# Patient Record
Sex: Female | Born: 1990 | Race: White | Hispanic: No | Marital: Married | Smoking: Never smoker
Health system: Southern US, Community
[De-identification: ages and names within clinical notes are randomized; demographics above are authoritative.]

## PROBLEM LIST (undated history)

## (undated) DIAGNOSIS — R011 Cardiac murmur, unspecified: Secondary | ICD-10-CM

## (undated) DIAGNOSIS — Q210 Ventricular septal defect: Secondary | ICD-10-CM

## (undated) DIAGNOSIS — R030 Elevated blood-pressure reading, without diagnosis of hypertension: Secondary | ICD-10-CM

## (undated) HISTORY — DX: Ventricular septal defect: Q21.0

## (undated) HISTORY — PX: MYRINGOTOMY: SUR874

## (undated) HISTORY — DX: Cardiac murmur, unspecified: R01.1

## (undated) HISTORY — PX: TIBIA FRACTURE SURGERY: SHX806

---
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
## 2011-05-22 ENCOUNTER — Ambulatory Visit (INDEPENDENT_AMBULATORY_CARE_PROVIDER_SITE_OTHER): Payer: BC Managed Care – PPO | Admitting: Family Medicine

## 2011-05-22 ENCOUNTER — Telehealth: Payer: Self-pay | Admitting: Internal Medicine

## 2011-05-22 ENCOUNTER — Encounter: Payer: Self-pay | Admitting: Family Medicine

## 2011-05-22 VITALS — BP 128/60 | HR 109 | Temp 98.5°F | Wt 162.0 lb

## 2011-05-22 DIAGNOSIS — R6889 Other general symptoms and signs: Secondary | ICD-10-CM | POA: Insufficient documentation

## 2011-05-22 MED ORDER — OSELTAMIVIR PHOSPHATE 75 MG PO CAPS: 75.0000 mg | ORAL_CAPSULE | Freq: Two times a day (BID) | ORAL | Status: AC

## 2011-05-22 NOTE — Progress Notes (Signed)
In school at [place] [employer].   Sick contacts with flu, similar sx.   She didn't get a flu shot.    "I feel awful." duration of symptoms: <48h of sx.   rhinorrhea:yes congestion:yes ear pain:yes sore throat:yes Cough:yes, dry myalgias:yes other concerns: feels hot and then has chills No vomiting, some nausea, no diarrhea.    ROS: See HPI.  Otherwise negative.    Meds, vitals, and allergies reviewed.   GEN: nad, alert and oriented HEENT: mucous membranes moist, TM w/o erythema, nasal epithelium injected, OP with cobblestoning NECK: supple w/o LA CV: rrr. Murmur noted PULM: ctab, no inc wob ABD: soft, +bs EXT: no edema Nontoxic.  Sinuses not ttp

## 2011-05-22 NOTE — Patient Instructions (Signed)
I would get a flu shot each fall (I would get it when you feel better). Drink plenty of fluids, take tylenol as needed, and consider taking the tamiflu- starting today.  I can't guarantee that you have the flu.  Even if you do, and you take the tamiflu, you should still have symptoms for at least 5-7 more days.   Take care.  Let us know if you have other concerns.

## 2011-05-24 ENCOUNTER — Encounter: Payer: Self-pay | Admitting: Family Medicine

## 2011-05-24 NOTE — Assessment & Plan Note (Signed)
I don't know if she really has flu.  We don't have the test.  She doesn't look toxic.  Okay for outpatient f/u.   It may be another virus.  I talked to her about options.  She needs to get flu shot when better and then yearly.  She'll consider the tamiflu, wanted to talk with mother.  D/w pt about supportive measures.

## 2011-09-16 ENCOUNTER — Emergency Department: Payer: Self-pay | Admitting: Internal Medicine

## 2011-09-16 LAB — CBC
HCT: 37.4 % (ref 35.0–47.0)
HGB: 12 g/dL (ref 12.0–16.0)
MCHC: 32.2 g/dL (ref 32.0–36.0)
Platelet: 272 10*3/uL (ref 150–440)
RBC: 4.34 10*6/uL (ref 3.80–5.20)
WBC: 13.3 10*3/uL — ABNORMAL HIGH (ref 3.6–11.0)

## 2011-09-16 LAB — AMYLASE: Amylase: 77 U/L (ref 25–115)

## 2012-07-13 ENCOUNTER — Ambulatory Visit: Payer: BC Managed Care – PPO

## 2012-07-22 ENCOUNTER — Ambulatory Visit: Payer: BC Managed Care – PPO

## 2013-10-16 IMAGING — CT CT CERVICAL SPINE WITHOUT CONTRAST
2 series · 10 of 14 positions shown, 12 images · non-contrast
Comparison: none

REASON FOR EXAM: COMMENTS:

PROCEDURE:     CT  - CT CERVICAL SPINE WO  - September 16, 2011  [DATE]
RESULT:      History: Neck pain.
Comparison Study: No recent.

[Series 6: axial · axial · 0.33mm/px · z∈[-310,-181]mm · 7 of 90 slices shown, 9 images]
[im 12/90  soft-tissue]
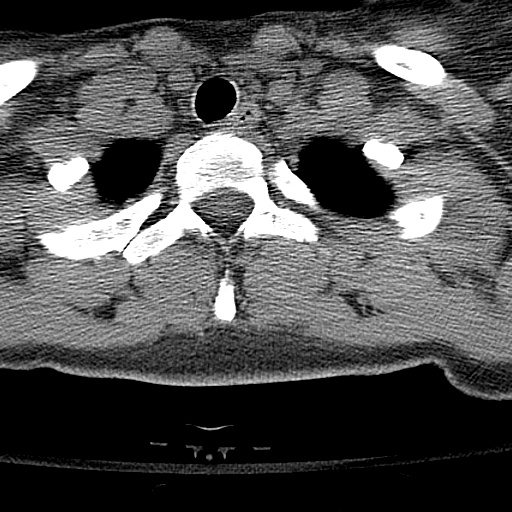
[im 12/90  bone]
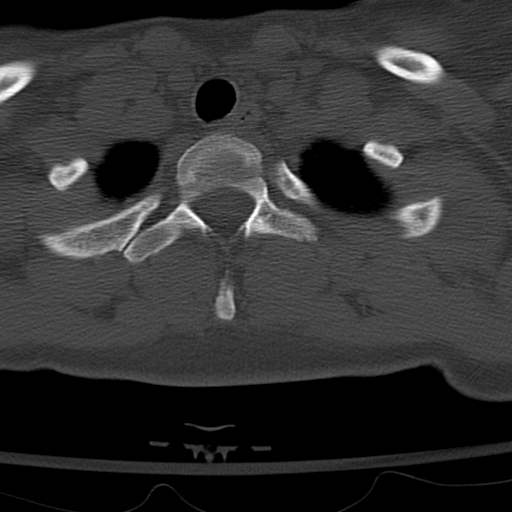
[im 23/90  bone]
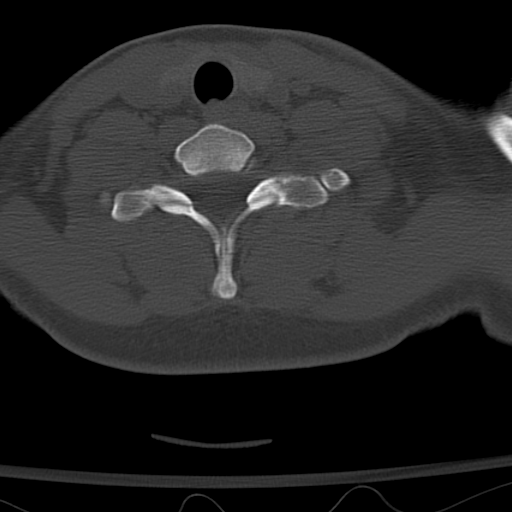
[im 34/90  bone]
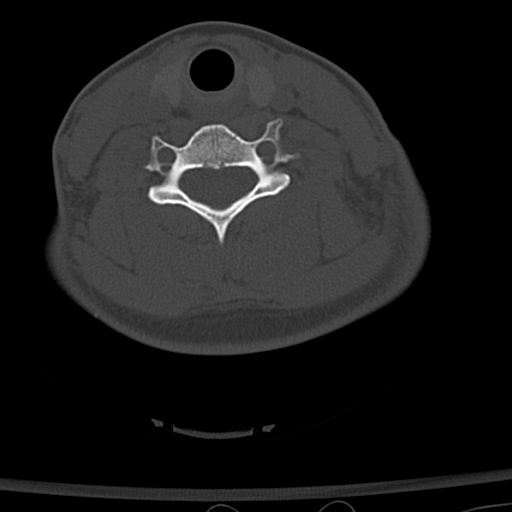
[im 45/90  bone]
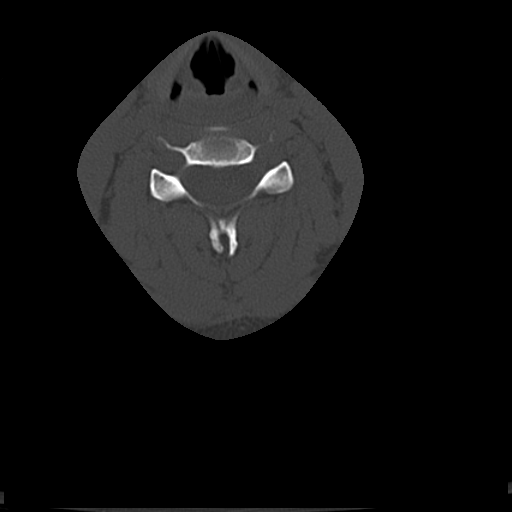
[im 56/90  soft-tissue]
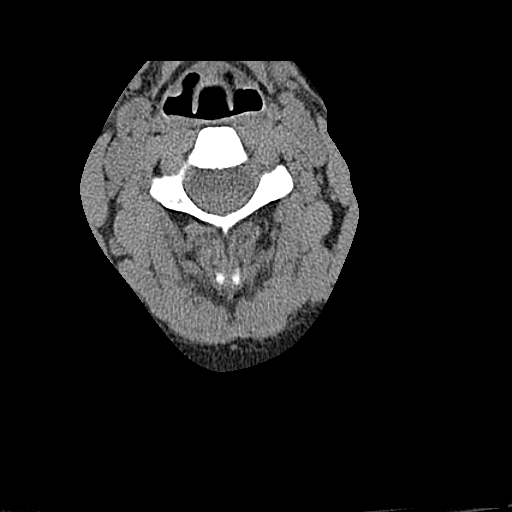
[im 56/90  bone]
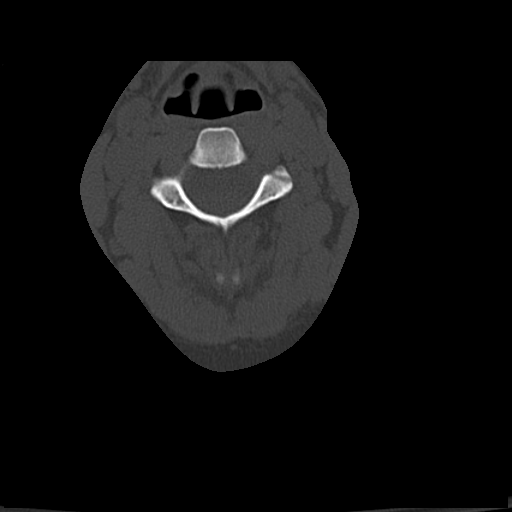
[im 67/90  bone]
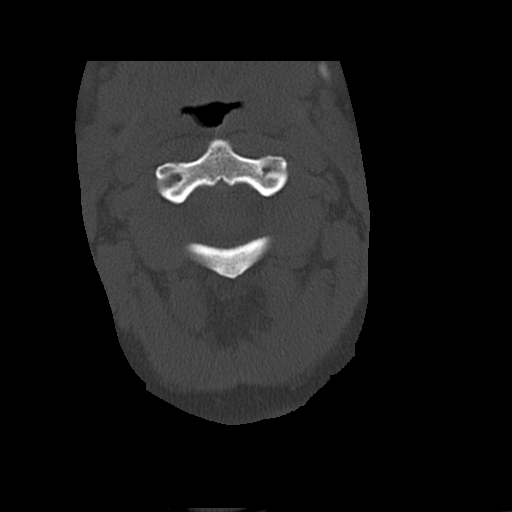
[im 78/90  bone]
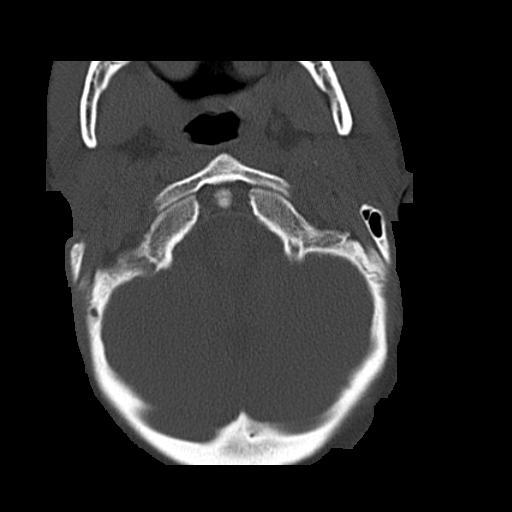

[Series 8: pac · axial · 0.26mm/px · z∈[-204,-187]mm · 3 of 46 slices shown]
[im 12/46  bone]
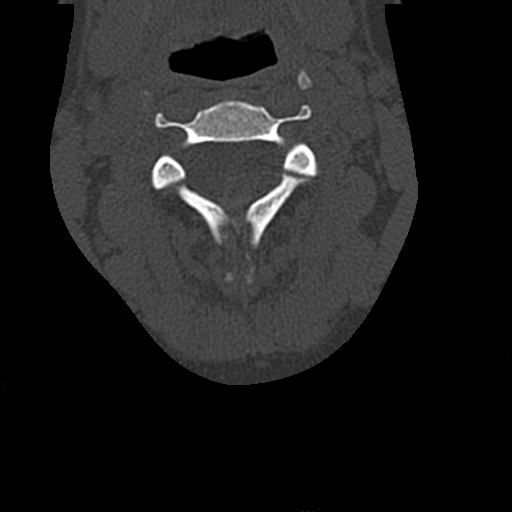
[im 23/46  bone]
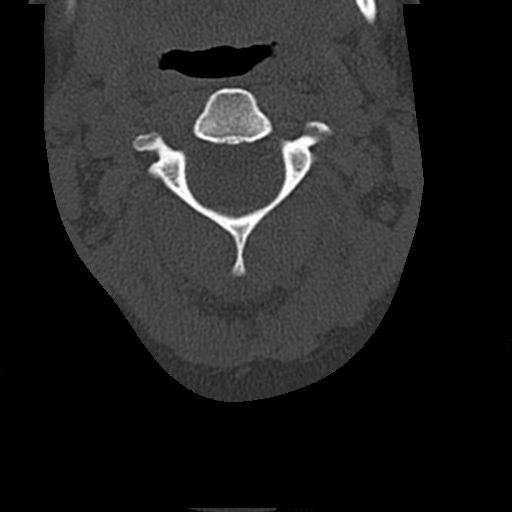
[im 34/46  bone]
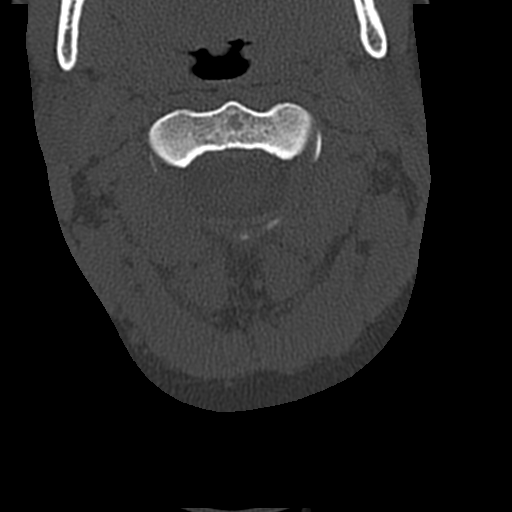

[10 of 14 positions shown; findings below may reference images not displayed]

FINDINGS: No acute soft tissue or bony abnormality. No evidence of fracture
or dislocation. Left apical infiltrate noted.
IMPRESSION: 1. No acute bony abnormality.
2. Left apical mild infiltrate.

Addendum: On further review a fracture of the lamina on the left at T1 and
T2 cannot be completely excluded. Thoracic spine CT suggested. Report phoned
to patient's physician at time of study.

## 2015-11-11 ENCOUNTER — Encounter: Payer: Self-pay | Admitting: Cardiology

## 2015-11-11 ENCOUNTER — Ambulatory Visit (INDEPENDENT_AMBULATORY_CARE_PROVIDER_SITE_OTHER): Payer: Managed Care, Other (non HMO) | Admitting: Cardiology

## 2015-11-11 VITALS — BP 160/60 | HR 74 | Ht 66.0 in | Wt 174.2 lb

## 2015-11-11 DIAGNOSIS — Q210 Ventricular septal defect: Secondary | ICD-10-CM | POA: Diagnosis not present

## 2015-11-11 DIAGNOSIS — R011 Cardiac murmur, unspecified: Secondary | ICD-10-CM

## 2015-11-11 NOTE — Progress Notes (Signed)
Cardiology Office Note   Date:  11/11/2015   ID:  Tiffany SiresKimberly Conner, DOB Apr 16, 1991, MRN [id number]  Referring Doctor:  Tiffany Mannanalia Aron, MD   Cardiologist:   Almond LintAileen Keyron Pokorski, MD   Reason for consultation:  Chief Complaint  Patient presents with  . other    Heart murmur c/o BP is normally low after working out. Meds reviewed verbally.      History of Present Illness: Tiffany Conner is a 25 y.o. female who presents for Evaluation for history of VSD.  Patient is here to follow-up for a history of supracristal VSD. Last time that she was evaluated for by her pediatric cardiologist was 06/13/2009. Per available medical records, she has a very small supracristal VSD, upper normal borderline increased proximal descending aortic flow velocity without two-dimensional imaging evidence of coronary doing. Patient will have been told in the past to continue to follow-up with an adult cardiologist she hasn't had time to do that exam for her recently.  Patient denies chest pain, shortness of breath, palpitations, syncope. She has not been aware of elevated blood pressure and thinking that there may be some element of anxiety during today's visit with a cardiologist. No history of cyanosis.   ROS:  Please see the history of present illness. Aside from mentioned under HPI, all other systems are reviewed and negative.     Past Medical History  Diagnosis Date  . VSD (ventricular septal defect)   . Heart murmur     Past Surgical History  Procedure Laterality Date  . Myringotomy    . Tibia fracture surgery       reports that she has never smoked. She has never used smokeless tobacco. She reports that she drinks alcohol. She reports that she does not use illicit drugs.   Family history is unknown by patient. No known CAD or SCD. There is an aunt in her family that had a VSD but likely much larger than hers, and she died in her early 3120s.  Current Outpatient Prescriptions  Medication Sig  Dispense Refill  . levonorgestrel-ethinyl estradiol (SEASONALE,INTROVALE,JOLESSA) 0.15-0.03 MG tablet Take 1 tablet by mouth daily.       No current facility-administered medications for this visit.    Allergies: Review of patient's allergies indicates no known allergies.    PHYSICAL EXAM: VS:  BP 160/60 mmHg  Pulse 74  Ht 5\' 6"  (1.676 m)  Wt 174 lb 4 oz (79.039 kg)  BMI 28.14 kg/m2 , Body mass index is 28.14 kg/(m^2). Wt Readings from Last 3 Encounters:  11/11/15 174 lb 4 oz (79.039 kg)  05/22/11 162 lb (73.483 kg) (88 %*, Z = 1.18)  08/12/09 157 lb (71.215 kg) (88 %*, Z = 1.19)   * Growth percentiles are based on CDC 2-20 Years data.    GENERAL:  well developed, well nourished, obese, not in acute distress HEENT: normocephalic, pink conjunctivae, anicteric sclerae, no xanthelasma, normal dentition, oropharynx clear NECK:  no neck vein engorgement, JVP normal, no hepatojugular reflux, carotid upstroke brisk and symmetric, no bruit, no thyromegaly, no lymphadenopathy LUNGS:  good respiratory effort, clear to auscultation bilaterally CV:  PMI not displaced, no thrills, no lifts, S1 and S2 within normal limits, no palpable S3 or S4, , no rubs, no gallops, 3/6 holosystolic murmur, parasternal, nonradiating. No diastolic murmur noted. ABD:  Soft, nontender, nondistended, normoactive bowel sounds, no abdominal aortic bruit, no hepatomegaly, no splenomegaly MS: nontender back, no kyphosis, no scoliosis, no joint deformities EXT:  2+ DP/PT pulses,  no edema, no varicosities, no cyanosis, no clubbing SKIN: warm, nondiaphoretic, normal turgor, no ulcers NEUROPSYCH: alert, oriented to person, place, and time, sensory/motor grossly intact, normal mood, appropriate affect  Recent Labs: No results found for requested labs within last 365 days.   Lipid Panel No results found for: CHOL, TRIG, HDL, CHOLHDL, VLDL, LDLCALC, LDLDIRECT   Other studies Reviewed:  EKG:  The ekg from 11/11/2015 was  personally reviewed by me and it revealed sinus rhythm, 74 BPM. R SR pattern in V1 suggesting RV conduction delay nonspecific T-wave abnormality. When compared to EKG from 09/11/2011, EKG was similar.  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 Patient is not known to have hypertension. There may be some element of anxiety during today's visit. If blood pressure log personally shows blood pressure today 140/80 any further evaluation. Patient to follow up with PCP.   Current medicines are reviewed at length with the patient today.  The patient does not have concerns regarding medicines.  Labs/ tests ordered today include:  Orders Placed This Encounter  Procedures  . EKG 12-Lead  . ECHOCARDIOGRAM COMPLETE    I had a lengthy and detailed discussion with the patient regarding diagnoses, prognosis, diagnostic options, treatment options .  I counseled the patient on importance of lifestyle modification including heart healthy diet, regular physical activity .  Disposition:   FU with undersigned after tests When necessary or in one year  I spent at least 40 minutes with the patient today and more than 50% of the time was spent counseling the patient and coordinating care.     Signed, Almond Lint, MD  11/11/2015 4:16 PM    [facility]

## 2015-11-11 NOTE — Patient Instructions (Signed)
Medication Instructions:  Your physician recommends that you continue on your current medications as directed. Please refer to the Current Medication list given to you today.   Labwork: None ordered  Testing/Procedures: Your physician has requested that you have an echocardiogram. Echocardiography is a painless test that uses sound waves to create images of your heart. It provides your doctor with information about the size and shape of your heart and how well your heart's chambers and valves are working. This procedure takes approximately one hour. There are no restrictions for this procedure.  Date & Time: ______________________________________________________________  Follow-Up: Your physician wants you to follow-up in: 1 year with Dr. Alvino ChapelIngal. You will receive a reminder letter in the mail two months in advance. If you don't receive a letter, please call our office to schedule the follow-up appointment.    Any Other Special Instructions Will Be Listed Below (If Applicable).     If you need a refill on your cardiac medications before your next appointment, please call your pharmacy.

## 2015-11-26 ENCOUNTER — Other Ambulatory Visit: Payer: Self-pay

## 2015-11-26 ENCOUNTER — Ambulatory Visit (INDEPENDENT_AMBULATORY_CARE_PROVIDER_SITE_OTHER): Payer: Managed Care, Other (non HMO)

## 2015-11-26 DIAGNOSIS — R011 Cardiac murmur, unspecified: Secondary | ICD-10-CM

## 2015-11-26 LAB — ECHOCARDIOGRAM COMPLETE
AOVTI: 40.4 cm
AV Area mean vel: 2.05 cm2
AV VEL mean LVOT/AV: 0.81
AV area mean vel ind: 1.06 cm2/m2
AV vel: 2.07
AVA: 2.07 cm2
AVAREAVTI: 2.33 cm2
AVAREAVTIIND: 1.07 cm2/m2
AVG: 6 mmHg
AVLVOTPG: 11 mmHg
AVPG: 13 mmHg
AVPKVEL: 179 cm/s
Ao pk vel: 0.92 m/s
Ao-asc: 22 cm
CHL CUP AV PEAK INDEX: 1.2
CHL CUP MV DEC (S): 222
DOP CAL AO MEAN VELOCITY: 117 cm/s
EERAT: 8.2
EWDT: 222 ms
FS: 38 % (ref 28–44)
IV/PV OW: 0.79
LA diam end sys: 33 mm
LA diam index: 1.7 cm/m2
LA vol: 47 mL
LASIZE: 33 mm
LAVOLA4C: 46.8 mL
LAVOLIN: 24.3 mL/m2
LV PW d: 6.69 mm — AB (ref 0.6–1.1)
LV TDI E'LATERAL: 16.1
LV TDI E'MEDIAL: 13.3
LV e' LATERAL: 16.1 cm/s
LV sys vol index: 25 mL/m2
LV sys vol: 49 mL — AB (ref 14–42)
LVDIAVOL: 96 mL (ref 46–106)
LVDIAVOLIN: 50 mL/m2
LVEEAVG: 8.2
LVEEMED: 8.2
LVOT VTI: 33 cm
LVOT area: 2.54 cm2
LVOT peak VTI: 0.82 cm
LVOT peak vel: 164 cm/s
LVOTD: 18 mm
LVOTSV: 84 mL
MV Peak grad: 7 mmHg
MV pk A vel: 63.9 m/s
MV pk E vel: 132 m/s
RV TAPSE: 22.2 mm
Simpson's disk: 49
Stroke v: 47 ml
Valve area index: 1.07

## 2016-07-14 ENCOUNTER — Encounter: Payer: Self-pay | Admitting: Primary Care

## 2016-07-14 ENCOUNTER — Ambulatory Visit (INDEPENDENT_AMBULATORY_CARE_PROVIDER_SITE_OTHER): Payer: Managed Care, Other (non HMO) | Admitting: Primary Care

## 2016-07-14 VITALS — BP 136/70 | HR 75 | Temp 98.0°F | Ht 66.0 in | Wt 178.0 lb

## 2016-07-14 DIAGNOSIS — G8929 Other chronic pain: Secondary | ICD-10-CM

## 2016-07-14 DIAGNOSIS — M545 Low back pain, unspecified: Secondary | ICD-10-CM

## 2016-07-14 DIAGNOSIS — Z7689 Persons encountering health services in other specified circumstances: Secondary | ICD-10-CM | POA: Diagnosis not present

## 2016-07-14 NOTE — Patient Instructions (Signed)
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  It was a pleasure to meet you today! Please don't hesitate to call me with any questions. Welcome to [facility]!

## 2016-07-14 NOTE — Progress Notes (Signed)
Subjective:    Patient ID: Tiffany Conner, female    DOB: Oct 21, 1990, 26 y.o.   MRN: [id number]  HPI  Tiffany Conner is a 26 year old female who presents today to establish care and discuss the problems mentioned below. Will obtain old records.  1) Back Pain: Located to the bilateral lower back that has been present since June 2017. She also experiences achiness to her lower extremites. 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 Her prior role was active as she rarely sat at a desk. She exercises regularly. She denies numbness/tinling, recent injury. She describes her pain as an achy stiffness that will occur after sitting for prolonged periods of time.  She is requesting a stand up desk as she thinks her symptoms are secondary to sitting at a desk for the majority of the day. She needs a note stating that she be allotted a stand up desk before they will grant this request.  Review of Systems  Musculoskeletal: Positive for back pain.  Skin: Negative for color change.  Neurological: Negative for weakness and numbness.       Past Medical History:  Diagnosis Date  . Heart murmur   . VSD (ventricular septal defect)      Social History   Social History  . Marital status: Married    Spouse name: N/A  . Number of children: N/A  . Years of education: N/A   Occupational History  . Not on file.   Social History Main Topics  . Smoking status: Never Smoker  . Smokeless tobacco: Never Used  . Alcohol use Yes  . Drug use: No  . Sexual activity: Not on file   Other Topics Concern  . Not on file   Social History Narrative   Married.   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   Enjoys reading exercise.     Past Surgical History:  Procedure Laterality Date  . MYRINGOTOMY    . TIBIA FRACTURE SURGERY      Family History  Problem Relation Age of Onset  . Ovarian cancer Other     No Known Allergies  Current Outpatient Prescriptions  on File Prior to Visit  Medication Sig Dispense Refill  . levonorgestrel-ethinyl estradiol (SEASONALE,INTROVALE,JOLESSA) 0.15-0.03 MG tablet Take 1 tablet by mouth daily.       No current facility-administered medications on file prior to visit.     BP 136/70 (BP Location: Left Arm, Patient Position: Sitting, Cuff Size: Normal)   Pulse 75   Temp 98 F (36.7 C) (Oral)   Ht 5\' 6"  (1.676 m)   Wt 178 lb (80.7 kg)   LMP 04/26/2016 (Approximate)   SpO2 99%   BMI 28.73 kg/m    Objective:   Physical Exam  Constitutional: She appears well-nourished.  Neck: Neck supple.  Cardiovascular: Normal rate.   Murmur heard. Pulmonary/Chest: Effort normal and breath sounds normal.  Musculoskeletal:       Lumbar back: She exhibits pain. She exhibits normal range of motion and no tenderness.  Normal ROM with extension and flexion. Negative straight leg raise.  Skin: Skin is warm and dry.          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 Stiffness, achiness after sitting. Exam today unremarkable. Do suspect symptoms are related to sedentary job that requires frequent sitting. Do believe she would benefit from a  standing desk. Letter provided. She will have her office call with any concerns/questions.  Morrie Sheldon, NP

## 2016-09-28 ENCOUNTER — Telehealth: Payer: Self-pay

## 2016-09-28 MED ORDER — LEVONORGEST-ETH ESTRAD 91-DAY 0.15-0.03 MG PO TABS: 1.0000 | ORAL_TABLET | Freq: Every day | ORAL | 1 refills | Status: DC

## 2016-09-28 NOTE — Telephone Encounter (Signed)
Pt calling for refill of bc until appt.  Pt aware refills eRx'd.

## 2016-10-30 ENCOUNTER — Encounter: Payer: Self-pay | Admitting: Obstetrics and Gynecology

## 2016-10-30 ENCOUNTER — Ambulatory Visit (INDEPENDENT_AMBULATORY_CARE_PROVIDER_SITE_OTHER): Payer: Commercial Managed Care - HMO | Admitting: Obstetrics and Gynecology

## 2016-10-30 VITALS — BP 122/62 | HR 77 | Ht 66.0 in | Wt 175.0 lb

## 2016-10-30 DIAGNOSIS — Z01419 Encounter for gynecological examination (general) (routine) without abnormal findings: Secondary | ICD-10-CM

## 2016-10-30 DIAGNOSIS — Z124 Encounter for screening for malignant neoplasm of cervix: Secondary | ICD-10-CM | POA: Diagnosis not present

## 2016-10-30 DIAGNOSIS — Z3041 Encounter for surveillance of contraceptive pills: Secondary | ICD-10-CM

## 2016-10-30 MED ORDER — LEVONORGEST-ETH ESTRAD 91-DAY 0.15-0.03 MG PO TABS: 1.0000 | ORAL_TABLET | Freq: Every day | ORAL | 3 refills | Status: DC

## 2016-10-30 NOTE — Progress Notes (Signed)
Chief Complaint  Patient presents with  . Gynecologic Exam     HPI:      [name] is a 26 y.o. G0P0000 who LMP was Patient's last menstrual period was 10/22/2016 (exact date)., presents today for her annual examination.  Her menses are regular every 3 months, lasting 3 days.  Dysmenorrhea none. She does have occasional intermenstrual bleeding.  Sex activity: single partner, contraception - OCP (estrogen/progesterone).  Last Pap: September 27, 2015  Results were: no abnormalities  Hx of STDs: none  There is no FH of breast cancer. There is a FH of ovarian cancer in her MGGM. Pt's mom is My Risk neg. The patient does not do self-breast exams.  Tobacco use: The patient denies current or previous tobacco use. Alcohol use: social drinker Exercise: moderately active  She does get adequate calcium and Vitamin D in her diet.   Past Medical History:  Diagnosis Date  . Heart murmur   . VSD (ventricular septal defect)     Past Surgical History:  Procedure Laterality Date  . MYRINGOTOMY    . TIBIA FRACTURE SURGERY      Family History  Problem Relation Age of Onset  . Ovarian cancer Other     Social History   Social History  . Marital status: Married    Spouse name: N/A  . Number of children: N/A  . Years of education: N/A   Occupational History  . Not on file.   Social History Main Topics  . Smoking status: Never Smoker  . Smokeless tobacco: Never Used  . Alcohol use Yes  . Drug use: No  . Sexual activity: Yes    Birth control/ protection: Pill   Other Topics Concern  . Not on file   Social History Narrative   Married.   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   Enjoys reading exercise.      Current Outpatient Prescriptions:  .  Clobetasol Propionate Emulsion 0.05 % topical foam, , Disp: , Rfl:  .  levonorgestrel-ethinyl estradiol (SEASONALE,INTROVALE,JOLESSA) 0.15-0.03 MG tablet, Take 1 tablet by mouth daily., Disp: 1  Package, Rfl: 3  ROS:  Review of Systems  Constitutional: Negative for fatigue, fever and unexpected weight change.  Respiratory: Negative for cough, shortness of breath and wheezing.   Cardiovascular: Negative for chest pain, palpitations and leg swelling.  Gastrointestinal: Negative for blood in stool, constipation, diarrhea, nausea and vomiting.  Endocrine: Negative for cold intolerance, heat intolerance and polyuria.  Genitourinary: Negative for dyspareunia, dysuria, flank pain, frequency, genital sores, hematuria, menstrual problem, pelvic pain, urgency, vaginal bleeding, vaginal discharge and vaginal pain.  Musculoskeletal: Negative for back pain, joint swelling and myalgias.  Skin: Negative for rash.  Neurological: Negative for dizziness, syncope, light-headedness, numbness and headaches.  Hematological: Negative for adenopathy.  Psychiatric/Behavioral: Negative for agitation, confusion, sleep disturbance and suicidal ideas. The patient is not nervous/anxious.      Objective: BP 122/62   Pulse 77   Ht 5\' 6"  (1.676 m)   Wt 175 lb (79.4 kg)   LMP 10/22/2016 (Exact Date)   BMI 28.25 kg/m    Physical Exam  Constitutional: She is oriented to person, place, and time. She appears well-developed and well-nourished.  Genitourinary: Vagina normal and uterus normal. There is no rash or tenderness on the right labia. There is no rash or tenderness on the left labia. No erythema or tenderness in the vagina. No vaginal discharge found. Right adnexum does  not display mass and does not display tenderness. Left adnexum does not display mass and does not display tenderness. Cervix does not exhibit motion tenderness or polyp. Uterus is not enlarged or tender.  Neck: Normal range of motion. No thyromegaly present.  Cardiovascular: Normal rate, regular rhythm and normal heart sounds.   No murmur heard. Pulmonary/Chest: Effort normal and breath sounds normal. Right breast exhibits no mass, no  nipple discharge, no skin change and no tenderness. Left breast exhibits no mass, no nipple discharge, no skin change and no tenderness.  Abdominal: Soft. There is no tenderness. There is no guarding.  Musculoskeletal: Normal range of motion.  Neurological: She is alert and oriented to person, place, and time. No cranial nerve deficit.  Psychiatric: She has a normal mood and affect. Her behavior is normal.  Vitals reviewed.   Assessment/Plan: Encounter for annual routine gynecological examination  Cervical cancer screening - Plan: IGP, rfx Aptima HPV ASCU  Encounter for surveillance of contraceptive pills - OCP RF eRxd.  - Plan: levonorgestrel-ethinyl estradiol (SEASONALE,INTROVALE,JOLESSA) 0.15-0.03 MG tablet             GYN counsel adequate intake of calcium and vitamin D, diet and exercise     F/U  Return in about 1 year (around 10/30/2017).  Alicia B. Copland, PA-C 10/30/2016 5:03 PM

## 2016-11-05 LAB — IGP, RFX APTIMA HPV ASCU: PAP SMEAR COMMENT: 0

## 2017-07-27 ENCOUNTER — Encounter: Payer: Commercial Managed Care - HMO | Admitting: Maternal Newborn

## 2017-07-29 ENCOUNTER — Encounter: Payer: 59 | Admitting: Advanced Practice Midwife

## 2017-08-06 ENCOUNTER — Encounter: Payer: Self-pay | Admitting: Maternal Newborn

## 2017-08-06 ENCOUNTER — Ambulatory Visit (INDEPENDENT_AMBULATORY_CARE_PROVIDER_SITE_OTHER): Payer: 59 | Admitting: Maternal Newborn

## 2017-08-06 VITALS — BP 140/60 | Wt 175.0 lb

## 2017-08-06 DIAGNOSIS — O0993 Supervision of high risk pregnancy, unspecified, third trimester: Secondary | ICD-10-CM | POA: Insufficient documentation

## 2017-08-06 DIAGNOSIS — Z3400 Encounter for supervision of normal first pregnancy, unspecified trimester: Secondary | ICD-10-CM

## 2017-08-06 DIAGNOSIS — Z0189 Encounter for other specified special examinations: Secondary | ICD-10-CM

## 2017-08-06 LAB — OB RESULTS CONSOLE VARICELLA ZOSTER ANTIBODY, IGG: Varicella: IMMUNE

## 2017-08-06 NOTE — Progress Notes (Signed)
C/O has a list of questions. rj

## 2017-08-06 NOTE — Patient Instructions (Signed)
First Trimester of Pregnancy The first trimester of pregnancy is from week 1 until the end of week 13 (months 1 through 3). A week after a sperm fertilizes an egg, the egg will implant on the wall of the uterus. This embryo will begin to develop into a baby. Genes from you and your partner will form the baby. The female genes will determine whether the baby will be a boy or a girl. At 6-8 weeks, the eyes and face will be formed, and the heartbeat can be seen on ultrasound. At the end of 12 weeks, all the baby's organs will be formed. Now that you are pregnant, you will want to do everything you can to have a healthy baby. Two of the most important things are to get good prenatal care and to follow your health care provider's instructions. Prenatal care is all the medical care you receive before the baby's birth. This care will help prevent, find, and treat any problems during the pregnancy and childbirth. Body changes during your first trimester Your body goes through many changes during pregnancy. The changes vary from woman to woman.  You may gain or lose a couple of pounds at first.  You may feel sick to your stomach (nauseous) and you may throw up (vomit). If the vomiting is uncontrollable, call your health care provider.  You may tire easily.  You may develop headaches that can be relieved by medicines. All medicines should be approved by your health care provider.  You may urinate more often. Painful urination may mean you have a bladder infection.  You may develop heartburn as a result of your pregnancy.  You may develop constipation because certain hormones are causing the muscles that push stool through your intestines to slow down.  You may develop hemorrhoids or swollen veins (varicose veins).  Your breasts may begin to grow larger and become tender. Your nipples may stick out more, and the tissue that surrounds them (areola) may become darker.  Your gums may bleed and may be  sensitive to brushing and flossing.  Dark spots or blotches (chloasma, mask of pregnancy) may develop on your face. This will likely fade after the baby is born.  Your menstrual periods will stop.  You may have a loss of appetite.  You may develop cravings for certain kinds of food.  You may have changes in your emotions from day to day, such as being excited to be pregnant or being concerned that something may go wrong with the pregnancy and baby.  You may have more vivid and strange dreams.  You may have changes in your hair. These can include thickening of your hair, rapid growth, and changes in texture. Some women also have hair loss during or after pregnancy, or hair that feels dry or thin. Your hair will most likely return to normal after your baby is born.  What to expect at prenatal visits During a routine prenatal visit:  You will be weighed to make sure you and the baby are growing normally.  Your blood pressure will be taken.  Your abdomen will be measured to track your baby's growth.  The fetal heartbeat will be listened to between weeks 10 and 14 of your pregnancy.  Test results from any previous visits will be discussed.  Your health care provider may ask you:  How you are feeling.  If you are feeling the baby move.  If you have had any abnormal symptoms, such as leaking fluid, bleeding, severe headaches,   or abdominal cramping.  If you are using any tobacco products, including cigarettes, chewing tobacco, and electronic cigarettes.  If you have any questions.  Other tests that may be performed during your first trimester include:  Blood tests to find your blood type and to check for the presence of any previous infections. The tests will also be used to check for low iron levels (anemia) and protein on red blood cells (Rh antibodies). Depending on your risk factors, or if you previously had diabetes during pregnancy, you may have tests to check for high blood  sugar that affects pregnant women (gestational diabetes).  Urine tests to check for infections, diabetes, or protein in the urine.  An ultrasound to confirm the proper growth and development of the baby.  Fetal screens for spinal cord problems (spina bifida) and Down syndrome.  HIV (human immunodeficiency virus) testing. Routine prenatal testing includes screening for HIV, unless you choose not to have this test.  You may need other tests to make sure you and the baby are doing well.  Follow these instructions at home: Medicines  Follow your health care provider's instructions regarding medicine use. Specific medicines may be either safe or unsafe to take during pregnancy.  Take a prenatal vitamin that contains at least 600 micrograms (mcg) of folic acid.  If you develop constipation, try taking a stool softener if your health care provider approves. Eating and drinking  Eat a balanced diet that includes fresh fruits and vegetables, whole grains, good sources of protein such as meat, eggs, or tofu, and low-fat dairy. Your health care provider will help you determine the amount of weight gain that is right for you.  Avoid raw meat and uncooked cheese. These carry germs that can cause birth defects in the baby.  Eating four or five small meals rather than three large meals a day may help relieve nausea and vomiting. If you start to feel nauseous, eating a few soda crackers can be helpful. Drinking liquids between meals, instead of during meals, also seems to help ease nausea and vomiting.  Limit foods that are high in fat and processed sugars, such as fried and sweet foods.  To prevent constipation: ? Eat foods that are high in fiber, such as fresh fruits and vegetables, whole grains, and beans. ? Drink enough fluid to keep your urine clear or pale yellow. Activity  Exercise only as directed by your health care provider. Most women can continue their usual exercise routine during  pregnancy. Try to exercise for 30 minutes at least 5 days a week. Exercising will help you: ? Control your weight. ? Stay in shape. ? Be prepared for labor and delivery.  Experiencing pain or cramping in the lower abdomen or lower back is a good sign that you should stop exercising. Check with your health care provider before continuing with normal exercises.  Try to avoid standing for long periods of time. Move your legs often if you must stand in one place for a long time.  Avoid heavy lifting.  Wear low-heeled shoes and practice good posture.  You may continue to have sex unless your health care provider tells you not to. Relieving pain and discomfort  Wear a good support bra to relieve breast tenderness.  Take warm sitz baths to soothe any pain or discomfort caused by hemorrhoids. Use hemorrhoid cream if your health care provider approves.  Rest with your legs elevated if you have leg cramps or low back pain.  If you develop   varicose veins in your legs, wear support hose. Elevate your feet for 15 minutes, 3-4 times a day. Limit salt in your diet. Prenatal care  Schedule your prenatal visits by the twelfth week of pregnancy. They are usually scheduled monthly at first, then more often in the last 2 months before delivery.  Write down your questions. Take them to your prenatal visits.  Keep all your prenatal visits as told by your health care provider. This is important. Safety  Wear your seat belt at all times when driving.  Make a list of emergency phone numbers, including numbers for family, friends, the hospital, and police and fire departments. General instructions  Ask your health care provider for a referral to a local prenatal education class. Begin classes no later than the beginning of month 6 of your pregnancy.  Ask for help if you have counseling or nutritional needs during pregnancy. Your health care provider can offer advice or refer you to specialists for help  with various needs.  Do not use hot tubs, steam rooms, or saunas.  Do not douche or use tampons or scented sanitary pads.  Do not cross your legs for long periods of time.  Avoid cat litter boxes and soil used by cats. These carry germs that can cause birth defects in the baby and possibly loss of the fetus by miscarriage or stillbirth.  Avoid all smoking, herbs, alcohol, and medicines not prescribed by your health care provider. Chemicals in these products affect the formation and growth of the baby.  Do not use any products that contain nicotine or tobacco, such as cigarettes and e-cigarettes. If you need help quitting, ask your health care provider. You may receive counseling support and other resources to help you quit.  Schedule a dentist appointment. At home, brush your teeth with a soft toothbrush and be gentle when you floss. Contact a health care provider if:  You have dizziness.  You have mild pelvic cramps, pelvic pressure, or nagging pain in the abdominal area.  You have persistent nausea, vomiting, or diarrhea.  You have a bad smelling vaginal discharge.  You have pain when you urinate.  You notice increased swelling in your face, hands, legs, or ankles.  You are exposed to fifth disease or chickenpox.  You are exposed to German measles (rubella) and have never had it. Get help right away if:  You have a fever.  You are leaking fluid from your vagina.  You have spotting or bleeding from your vagina.  You have severe abdominal cramping or pain.  You have rapid weight gain or loss.  You vomit blood or material that looks like coffee grounds.  You develop a severe headache.  You have shortness of breath.  You have any kind of trauma, such as from a fall or a car accident. Summary  The first trimester of pregnancy is from week 1 until the end of week 13 (months 1 through 3).  Your body goes through many changes during pregnancy. The changes vary from  woman to woman.  You will have routine prenatal visits. During those visits, your health care provider will examine you, discuss any test results you may have, and talk with you about how you are feeling. This information is not intended to replace advice given to you by your health care provider. Make sure you discuss any questions you have with your health care provider. Document Released: 05/12/2001 Document Revised: 04/29/2016 Document Reviewed: 04/29/2016 [employer] Interactive Patient Education  2018 [employer]  

## 2017-08-06 NOTE — Progress Notes (Signed)
08/06/2017   Chief Complaint: Amenorrhea, positive home pregnancy test, desires prenatal care.  Transfer of Care Patient: no  History of Present Illness: Tiffany Conner is a 27 y.o. G1P0000 at [redacted]w[redacted]d based on Patient's last menstrual period on 06/12/2017, with an Estimated Date of Delivery: 03/19/18, with the above CC.   Her periods were: regular periods  She was using no method when she conceived.  She has Positive signs or symptoms of nausea of pregnancy. She has Negative signs or symptoms of miscarriage or preterm labor She identifies Negative Zika risk factors for her and her partner; plans cruise that will be in [place] and parts of [place], has checked CDC advisories and areas are not of concern. Advised to use mosquito repellent and avoid mosquito infested areas and to avoid visiting areas when in doubt about possible exposure. On any different medications around the time she conceived/early pregnancy: No  History of varicella: Yes   ROS: A 12-point review of systems was performed and negative, except as stated in the above HPI.  OBGYN History: As per HPI. OB History  Gravida Para Term Preterm AB Living  1 0 0 0 0 0  SAB TAB Ectopic Multiple Live Births  0 0 0 0 0    # Outcome Date GA Lbr Len/2nd Weight Sex Delivery Anes PTL Lv  1 Current               Any issues with any prior pregnancies: not applicable Any prior children are healthy, doing well, without any problems or issues: not applicable History of pap smears: Yes. Last pap smear 11/02/2016. NILM. History of STIs: No   Past Medical History: Past Medical History:  Diagnosis Date  . Heart murmur   . VSD (ventricular septal defect)     Past Surgical History: Past Surgical History:  Procedure Laterality Date  . MYRINGOTOMY    . TIBIA FRACTURE SURGERY      Family History:  Family History  Problem Relation Age of Onset  . Ovarian cancer Other    She denies any female cancers, bleeding or blood clotting  disorders.  She denies any history of intellectual disability, birth defects or genetic disorders in her or the FOB's history  Social History:  Social History   Socioeconomic History  . Marital status: Married    Spouse name: Not on file  . Number of children: Not on file  . Years of education: 38  . Highest education level: Not on file  Social Needs  . Financial resource strain: Not on file  . Food insecurity - worry: Not on file  . Food insecurity - inability: Not on file  . Transportation needs - medical: Not on file  . Transportation needs - non-medical: Not on file  Occupational History  . Occupation: ACC COORDINATOR  Tobacco Use  . Smoking status: Never Smoker  . Smokeless tobacco: Never Used  Substance and Sexual Activity  . Alcohol use: No    Frequency: Never  . Drug use: No  . Sexual activity: Yes    Birth control/protection: None  Other Topics Concern  . Not on file  Social History Narrative   Married.   Works as a Nurse, adult for [employer].    Graduate of [employer].   Enjoys reading exercise.    Any cats in the household: yes, aware to avoid cat litter and feces, husband changes litter box Denies history of and current domestic violence.  Allergy: No Known Allergies  Current Outpatient Medications:  Current Outpatient Medications:  .  Clobetasol Propionate Emulsion 0.05 % topical foam, , Disp: , Rfl:    Physical Exam:   BP 140/60   Wt 175 lb (79.4 kg)   LMP 06/12/2017   BMI 28.25 kg/m  Body mass index is 28.25 kg/m. Constitutional: Well nourished, well developed female in no acute distress.  Neck:  Supple, normal appearance, and no thyromegaly  Cardiovascular: S1 and S2 normal, regular rate and rhythm, 3/6 pansystolic murmur heard  Respiratory:  Clear to auscultation bilaterally. Normal respiratory effort Abdomen: no masses or hernias; diffusely non tender to palpation, non distended Breasts: breasts appear normal, no suspicious masses, no  skin or nipple changes or axillary nodes, risk and benefit of breast self-exam was discussed. Neuro/Psych:  Normal mood and affect.  Skin:  Warm and dry.  Lymphatic:  No inguinal lymphadenopathy.   Pelvic exam: is not limited by body habitus External genitalia, Bartholin's glands, Urethra, Skene's glands: within normal limits Vagina: within normal limits and with no blood in the vault  Cervix: normal appearing cervix without discharge or lesions, closed/long/high Uterus:  enlarged: consistent with early pregnancy Adnexa:  normal adnexa and no mass, fullness, tenderness  Assessment: Tiffany Conner is a 27 y.o. G1P0000 at 1130w6d based on Patient's last menstrual period on 06/12/2017, with an Estimated Date of Delivery: 03/19/18, presenting for prenatal care.  Plan:  1) Avoid alcoholic beverages 2) Patient encouraged not to smoke  3) Discontinue the use of all non-medicinal drugs and chemicals 4) Take prenatal vitamins daily 5) Seatbelt use advised 6) Nutrition, food safety (fish, cheese advisories, and high nitrite foods) and exercise discussed 7) Hospital and practice style and delivering at [facility] discussed  8) Patient is asked about travel to areas at risk for the Zika virus, and counseled to avoid travel and exposure to mosquitoes or sexual partners who may have themselves been exposed to the virus. Testing is discussed, and will be ordered as appropriate. 9) [facility] advised 10) Genetic Screening, such as with 1st Trimester Screening, cell free fetal DNA, AFP testing, and Ultrasound, as well as with amniocentesis and CVS as appropriate, is discussed with patient. She plans to have genetic testing this pregnancy. 11) Ultrasound for dates and viability ordered for next week. 12) Has VSD with no current symptoms and saw cardiologist in 2017. Advised that we would refer to cardiology during pregnancy with any concerning symptoms.  Problem list reviewed and updated.  Marcelyn BruinsJacelyn  Schmid, CNM [facility], [facility] 08/06/2017  4:17 PM

## 2017-08-07 LAB — RPR+RH+ABO+RUB AB+AB SCR+CB...
Antibody Screen: NEGATIVE
HIV SCREEN 4TH GENERATION: NONREACTIVE
Hematocrit: 40.1 % (ref 34.0–46.6)
Hemoglobin: 13.3 g/dL (ref 11.1–15.9)
Hepatitis B Surface Ag: NEGATIVE
MCH: 30.3 pg (ref 26.6–33.0)
MCHC: 33.2 g/dL (ref 31.5–35.7)
MCV: 91 fL (ref 79–97)
Platelets: 270 10*3/uL (ref 150–379)
RBC: 4.39 x10E6/uL (ref 3.77–5.28)
RDW: 12.9 % (ref 12.3–15.4)
RPR: NONREACTIVE
Rh Factor: POSITIVE
Rubella Antibodies, IGG: 1.47 index (ref >0.99)
VARICELLA: 1670 {index} (ref >165)
WBC: 7.7 10*3/uL (ref 3.4–10.8)

## 2017-08-08 LAB — URINE DRUG PANEL 7
AMPHETAMINES, URINE: NEGATIVE ng/mL
BARBITURATE QUANT UR: NEGATIVE ng/mL
Benzodiazepine Quant, Ur: NEGATIVE ng/mL
Cannabinoid Quant, Ur: NEGATIVE ng/mL
Cocaine (Metab.): NEGATIVE ng/mL
Opiate Quant, Ur: NEGATIVE ng/mL
PCP Quant, Ur: NEGATIVE ng/mL

## 2017-08-08 LAB — GC/CHLAMYDIA PROBE AMP
Chlamydia trachomatis, NAA: NEGATIVE
NEISSERIA GONORRHOEAE BY PCR: NEGATIVE

## 2017-08-08 LAB — URINE CULTURE: Organism ID, Bacteria: NO GROWTH

## 2017-08-16 ENCOUNTER — Ambulatory Visit (INDEPENDENT_AMBULATORY_CARE_PROVIDER_SITE_OTHER): Payer: 59

## 2017-08-16 ENCOUNTER — Encounter: Payer: Self-pay | Admitting: Obstetrics and Gynecology

## 2017-08-16 ENCOUNTER — Ambulatory Visit (INDEPENDENT_AMBULATORY_CARE_PROVIDER_SITE_OTHER): Payer: 59 | Admitting: Obstetrics and Gynecology

## 2017-08-16 VITALS — BP 142/62 | Wt 179.0 lb

## 2017-08-16 DIAGNOSIS — Z0189 Encounter for other specified special examinations: Secondary | ICD-10-CM

## 2017-08-16 DIAGNOSIS — Z3400 Encounter for supervision of normal first pregnancy, unspecified trimester: Secondary | ICD-10-CM

## 2017-08-16 DIAGNOSIS — Z3A09 9 weeks gestation of pregnancy: Secondary | ICD-10-CM

## 2017-08-16 NOTE — Progress Notes (Signed)
    Routine Prenatal Care Visit  Subjective  Tiffany Conner is a 27 y.o. G1P0000 at 41221w2d being seen today for ongoing prenatal care.  She is currently monitored for the following issues for this low-risk pregnancy and has PSORIASIS; VSD; and Supervision of normal first pregnancy, antepartum on their problem list.  ----------------------------------------------------------------------------------- Patient reports no complaints.    . Vag. Bleeding: None.  Movement: Absent. Denies leaking of fluid.  ----------------------------------------------------------------------------------- The following portions of the patient's history were reviewed and updated as appropriate: allergies, current medications, past family history, past medical history, past social history, past surgical history and problem list. Problem list updated.   Objective  Blood pressure (!) 142/62, weight 179 lb (81.2 kg), last menstrual period 06/12/2017. Pregravid weight 171 lb (77.6 kg) Total Weight Gain 8 lb (3.629 kg) Urinalysis:      Fetal Status: Fetal Heart Rate (bpm): 175   Movement: Absent     General:  Alert, oriented and cooperative. Patient is in no acute distress.  Skin: Skin is warm and dry. No rash noted.   Cardiovascular: Normal heart rate noted  Respiratory: Normal respiratory effort, no problems with respiration noted  Abdomen: Soft, gravid, appropriate for gestational age. Pain/Pressure: Absent     Pelvic:  Cervical exam deferred        Extremities: Normal range of motion.     ental Status: Normal mood and affect. Normal behavior. Normal judgment and thought content.     Assessment   27 y.o. G1P0000 at 7221w2d by  03/19/2018, by Last Menstrual Period presenting for routine prenatal visit  Plan   FIRST Problems (from 08/06/17 to present)    Problem Noted Resolved   Supervision of normal first pregnancy, antepartum 08/06/2017 by Oswaldo ConroySchmid, Jacelyn Y, CNM No   Overview Addendum 08/16/2017  5:16 PM by  Natale MilchSchuman, Rikayla Demmon R, MD    Clinic [facility] Prenatal Labs  Dating LMP = 9week US Blood type: A/Positive/-- (03/08 1446)   Genetic Screen 1 Screen:    AFP:     Quad:     NIPS: Antibody:Negative (03/08 1446)  Anatomic US  Rubella: 1.47 (03/08 1446) Varicella:    GTT Early:               Third trimester:  RPR: Non Reactive (03/08 1446)   Rhogam Not applicable HBsAg: Negative (03/08 1446)   TDaP vaccine                       Flu Shot: HIV: Non Reactive (03/08 1446)   Baby Food                                GBS:   Contraception  Pap: 11/02/2016, NILM  CBB     CS/VBAC    Support Person                  Gestational age appropriate obstetric precautions including but not limited to vaginal bleeding, contractions, leaking of fluid and fetal movement were reviewed in detail with the patient.    Discussed first trimester screen vs MaterniTi21 testing. Patient would like first trimester screen.  Return in about 3 weeks (around 09/06/2017) for ROB and US for first trimester screen.  Richelle Itohristanna Tanav Orsak [facility], [facility] 08/16/2017, 5:39 PM

## 2017-08-17 ENCOUNTER — Other Ambulatory Visit: Payer: Self-pay | Admitting: Obstetrics and Gynecology

## 2017-08-17 DIAGNOSIS — Z1379 Encounter for other screening for genetic and chromosomal anomalies: Secondary | ICD-10-CM

## 2017-08-17 NOTE — Progress Notes (Signed)
[id number]

## 2017-08-19 ENCOUNTER — Telehealth: Payer: Self-pay

## 2017-08-19 NOTE — Telephone Encounter (Signed)
Pt states she is going on a cruise in 2 weeks and per travel agent the cruise line is requiring a doctors note stating it is okay for her to go on the cruise and how far along she is. Please send through MyChart for patient to print. Thank you!  Pt cb# [phone]

## 2017-08-20 ENCOUNTER — Encounter: Payer: Self-pay | Admitting: Maternal Newborn

## 2017-08-20 NOTE — Progress Notes (Signed)
Letter copied to MyChart and sent to patient.  Marcelyn BruinsJacelyn Nyiah Pianka, CNM 08/20/2017  10:38 AM

## 2017-09-13 ENCOUNTER — Encounter: Payer: Self-pay | Admitting: Maternal Newborn

## 2017-09-13 ENCOUNTER — Ambulatory Visit (INDEPENDENT_AMBULATORY_CARE_PROVIDER_SITE_OTHER): Payer: 59 | Admitting: Maternal Newborn

## 2017-09-13 ENCOUNTER — Ambulatory Visit (INDEPENDENT_AMBULATORY_CARE_PROVIDER_SITE_OTHER): Payer: 59

## 2017-09-13 VITALS — BP 154/60 | Wt 179.0 lb

## 2017-09-13 DIAGNOSIS — Z1379 Encounter for other screening for genetic and chromosomal anomalies: Secondary | ICD-10-CM | POA: Diagnosis not present

## 2017-09-13 DIAGNOSIS — R030 Elevated blood-pressure reading, without diagnosis of hypertension: Secondary | ICD-10-CM

## 2017-09-13 DIAGNOSIS — Z3A13 13 weeks gestation of pregnancy: Secondary | ICD-10-CM

## 2017-09-13 DIAGNOSIS — Z3400 Encounter for supervision of normal first pregnancy, unspecified trimester: Secondary | ICD-10-CM

## 2017-09-13 NOTE — Progress Notes (Signed)
    Routine Prenatal Care Visit  Subjective  Tiffany SiresKimberly Conner is a 27 y.o. G1P0000 at 3322w2d being seen today for ongoing prenatal care. She is currently monitored for the following issues for this low-risk pregnancy and has PSORIASIS; VSD; and Supervision of normal first pregnancy, antepartum on their problem list.  ----------------------------------------------------------------------------------- Patient reports occasional headaches, relieved by Tylenol.  Feels well otherwise. Vag. Bleeding: None.  ----------------------------------------------------------------------------------- The following portions of the patient's history were reviewed and updated as appropriate: allergies, current medications, past family history, past medical history, past social history, past surgical history and problem list. Problem list updated.   Objective  Last menstrual period 06/12/2017. Pregravid weight 171 lb (77.6 kg) Total Weight Gain 8 lb (3.629 kg) Urinalysis: Urine Protein: Negative Urine Glucose: Negative  Fetal Status: Fetal Heart Rate (bpm): 161         General:  Alert, oriented and cooperative. Patient is in no acute distress.  Skin: Skin is warm and dry. No rash noted.   Cardiovascular: Normal heart rate noted  Respiratory: Normal respiratory effort, no problems with respiration noted  Abdomen: Soft, gravid, appropriate for gestational age. Pain/Pressure: Absent     Pelvic:  Cervical exam deferred        Extremities: Normal range of motion.     Mental Status: Normal mood and affect. Normal behavior. Normal judgment and thought content.     Assessment   27 y.o. G1P0000 at 2022w2d, EDD 03/19/2018 by Last Menstrual Period presenting for routine prenatal visit.  Plan   FIRST Problems (from 08/06/17 to present)    Problem Noted Resolved   Supervision of normal first pregnancy, antepartum 08/06/2017 by Oswaldo ConroySchmid, Jacelyn Y, CNM No   Overview Addendum 08/16/2017  5:16 PM by Natale MilchSchuman, Christanna  R, MD    Clinic [facility] Prenatal Labs  Dating LMP = 9week US Blood type: A/Positive/-- (03/08 1446)   Genetic Screen 1 Screen:    AFP:     Quad:     NIPS: Antibody:Negative (03/08 1446)  Anatomic US  Rubella: 1.47 (03/08 1446) Varicella:    GTT Early:               Third trimester:  RPR: Non Reactive (03/08 1446)   Rhogam Not applicable HBsAg: Negative (03/08 1446)   TDaP vaccine                       Flu Shot: HIV: Non Reactive (03/08 1446)   Baby Food                                GBS:   Contraception  Pap: 11/02/2016, NILM  CBB     CS/VBAC    Support Person               CMP and PC ratio today due to elevated systolic blood pressure. First trimester screen today.  Return in about 2 weeks (around 09/27/2017) for ROB/BP check.  Marcelyn BruinsJacelyn Schmid, CNM 09/13/2017  4:39 PM

## 2017-09-13 NOTE — Patient Instructions (Signed)

## 2017-09-13 NOTE — Progress Notes (Signed)
No concerns.rj 

## 2017-09-14 LAB — PROTEIN / CREATININE RATIO, URINE
CREATININE, UR: 52.4 mg/dL
PROTEIN UR: 9 mg/dL
PROTEIN/CREAT RATIO: 172 mg/g{creat} (ref 0–200)

## 2017-09-17 LAB — FIRST TRIMESTER SCREEN W/NT
CRL: 68.9 mm
DIA MOM: 1.19
DIA VALUE: 238.8 pg/mL
GEST AGE-COLLECT: 13 wk
MATERNAL AGE AT EDD: 26.7 a
NUMBER OF FETUSES: 1
Nuchal Translucency MoM: 0.91
Nuchal Translucency: 1.3 mm
PAPP-A MOM: 0.94
PAPP-A Value: 900.1 ng/mL
TEST RESULTS: NEGATIVE
Weight: 179 [lb_av]
hCG MoM: 2.04
hCG Value: 148.8 IU/mL

## 2017-09-17 LAB — COMPREHENSIVE METABOLIC PANEL
A/G RATIO: 1.5 (ref 1.2–2.2)
ALBUMIN: 3.8 g/dL (ref 3.5–5.5)
ALK PHOS: 59 IU/L (ref 39–117)
ALT: 15 IU/L (ref 0–32)
AST: 18 IU/L (ref 0–40)
BUN / CREAT RATIO: 23 (ref 9–23)
BUN: 13 mg/dL (ref 6–20)
CHLORIDE: 104 mmol/L (ref 96–106)
CO2: 19 mmol/L — ABNORMAL LOW (ref 20–29)
Calcium: 9.1 mg/dL (ref 8.7–10.2)
Creatinine, Ser: 0.57 mg/dL (ref 0.57–1.00)
GFR calc non Af Amer: 128 mL/min/{1.73_m2} (ref >59)
GFR, EST AFRICAN AMERICAN: 148 mL/min/{1.73_m2} (ref >59)
GLOBULIN, TOTAL: 2.5 g/dL (ref 1.5–4.5)
Glucose: 86 mg/dL (ref 65–99)
Potassium: 4.4 mmol/L (ref 3.5–5.2)
SODIUM: 140 mmol/L (ref 134–144)
TOTAL PROTEIN: 6.3 g/dL (ref 6.0–8.5)

## 2017-09-29 ENCOUNTER — Ambulatory Visit (INDEPENDENT_AMBULATORY_CARE_PROVIDER_SITE_OTHER): Payer: 59 | Admitting: Obstetrics and Gynecology

## 2017-09-29 VITALS — BP 142/60 | Wt 181.0 lb

## 2017-09-29 DIAGNOSIS — Z363 Encounter for antenatal screening for malformations: Secondary | ICD-10-CM

## 2017-09-29 DIAGNOSIS — Z3400 Encounter for supervision of normal first pregnancy, unspecified trimester: Secondary | ICD-10-CM

## 2017-09-29 DIAGNOSIS — Z3A15 15 weeks gestation of pregnancy: Secondary | ICD-10-CM

## 2017-09-29 DIAGNOSIS — R030 Elevated blood-pressure reading, without diagnosis of hypertension: Secondary | ICD-10-CM

## 2017-09-29 NOTE — Progress Notes (Signed)
ROB

## 2017-09-30 DIAGNOSIS — R030 Elevated blood-pressure reading, without diagnosis of hypertension: Secondary | ICD-10-CM | POA: Insufficient documentation

## 2017-09-30 NOTE — Progress Notes (Signed)
Routine Prenatal Care Visit  Subjective  Tiffany Conner is a 27 y.o. G1P0000 at [redacted]w[redacted]d being seen today for ongoing prenatal care.  She is currently monitored for the following issues for this low-risk pregnancy and has PSORIASIS; VSD; and Supervision of normal first pregnancy, antepartum on their problem list.  ----------------------------------------------------------------------------------- Patient reports no complaints.   Contractions: Not present. Vag. Bleeding: None.  Movement: Absent. Denies leaking of fluid.  ----------------------------------------------------------------------------------- The following portions of the patient's history were reviewed and updated as appropriate: allergies, current medications, past family history, past medical history, past social history, past surgical history and problem list. Problem list updated.   Objective  Blood pressure (!) 142/60, weight 181 lb (82.1 kg), last menstrual period 06/12/2017. Pregravid weight 171 lb (77.6 kg) Total Weight Gain 10 lb (4.536 kg) Urinalysis: Urine Protein: Negative Urine Glucose: Negative  Fetal Status: Fetal Heart Rate (bpm): 160   Movement: Absent     General:  Alert, oriented and cooperative. Patient is in no acute distress.  Skin: Skin is warm and dry. No rash noted.   Cardiovascular: Normal heart rate noted  Respiratory: Normal respiratory effort, no problems with respiration noted  Abdomen: Soft, gravid, appropriate for gestational age. Pain/Pressure: Absent     Pelvic:  Cervical exam deferred        Extremities: Normal range of motion.     ental Status: Normal mood and affect. Normal behavior. Normal judgment and thought content.     Assessment   27 y.o. G1P0000 at [redacted]w[redacted]d by  03/19/2018, by Last Menstrual Period presenting for routine prenatal visit  Plan   FIRST Problems (from 08/06/17 to present)    Problem Noted Resolved   White coat syndrome without diagnosis of hypertension 09/30/2017  by Vena Austria, MD No   Overview Signed 09/30/2017  9:28 PM by Vena Austria, MD     Aspirin 81 mg daily after 12 weeks; discontinue after 36 weeks  baseline labs with CBC, CMP, urine protein/creatinine ratio  no BP meds unless BPs become elevated  ultrasound for growth at 28, 32, 36 weeks    Current antihypertensives:  None   Baseline and surveillance labs (pulled in from [facility], refresh links as needed)  Lab Results  Component Value Date   PLT 270 08/06/2017   CREATININE 0.57 09/13/2017   AST 18 09/13/2017   ALT 15 09/13/2017    Antenatal Testing CHTN - O10.919  Group I  BP < 140/90, no preeclampsia, AGA,  nml AFV, +/- meds    Group II BP > 140/90, on meds, no preeclampsia, AGA, nml AFV  20-28-34-38  20-24-28-32-35-38  32//2 x wk  28//BPP wkly then 32//2 x wk  40 no meds; 39 meds  PRN or 37  Pre-eclampsia  GHTN - O13.9/Preeclampsia without severe features  - O14.00   Preeclampsia with severe features - O14.10  Q 3-4wks  Q 2 wks  28//BPP wkly then 32//2 x wk  Inpatient  37  PRN or 34        Supervision of normal first pregnancy, antepartum 08/06/2017 by Oswaldo Conroy, CNM No   Overview Addendum 09/30/2017  9:29 PM by Vena Austria, MD    [facility]inic [facility] Prenatal Labs  Dating LMP = 9week [place] Blood type: A/Positive/-- (03/08 1446)   Genetic Screen 1 Screen: negative Antibody:Negative (03/08 1446)  Anatomic [place]  Rubella: 1.47 (03/08 1446) Varicella:    GTT  RPR: Non Reactive (03/08 1446)   Rhogam Not applicable HBsAg: Negative (  03/08 1446)   TDaP vaccine                      HIV: Non Reactive (03/08 1446)   Baby Food                                GBS:   Contraception  Pap: 11/02/2016, NILM  CBB     CS/VBAC    Support Person                 Gestational age appropriate obstetric precautions including but not limited to vaginal bleeding, contractions, leaking of fluid and fetal movement were reviewed in detail with the patient.     - BP remains elevated - Baseline labs normal - Home reading have been normal.  Discussed in order to ensure we do not miss development of GHTN or preeclampsia I would recommend following her like a CHTN.  Start ASA.  Monthly growth scan starting at 28 weeks, APT if BP consistently elevated.  No BP meds unless severe range BPs  Return in about 1 month (around 10/27/2017) for rob and anatomy scan.  Vena Austria, MD, Evern Core [facility], [facility] 09/30/2017, 9:26 PM

## 2017-10-27 ENCOUNTER — Ambulatory Visit (INDEPENDENT_AMBULATORY_CARE_PROVIDER_SITE_OTHER): Payer: 59 | Admitting: Obstetrics and Gynecology

## 2017-10-27 ENCOUNTER — Encounter: Payer: Self-pay | Admitting: Obstetrics and Gynecology

## 2017-10-27 ENCOUNTER — Ambulatory Visit (INDEPENDENT_AMBULATORY_CARE_PROVIDER_SITE_OTHER): Payer: 59

## 2017-10-27 VITALS — BP 130/70 | Wt 186.0 lb

## 2017-10-27 DIAGNOSIS — O0992 Supervision of high risk pregnancy, unspecified, second trimester: Secondary | ICD-10-CM

## 2017-10-27 DIAGNOSIS — Z3A19 19 weeks gestation of pregnancy: Secondary | ICD-10-CM | POA: Diagnosis not present

## 2017-10-27 DIAGNOSIS — Q210 Ventricular septal defect: Secondary | ICD-10-CM

## 2017-10-27 DIAGNOSIS — Z363 Encounter for antenatal screening for malformations: Secondary | ICD-10-CM | POA: Diagnosis not present

## 2017-10-27 DIAGNOSIS — R030 Elevated blood-pressure reading, without diagnosis of hypertension: Secondary | ICD-10-CM

## 2017-10-27 DIAGNOSIS — L408 Other psoriasis: Secondary | ICD-10-CM

## 2017-10-27 DIAGNOSIS — Z3400 Encounter for supervision of normal first pregnancy, unspecified trimester: Secondary | ICD-10-CM

## 2017-10-27 NOTE — Progress Notes (Signed)
Routine Prenatal Care Visit  Subjective  Tiffany Conner is a 27 y.o. G1P0000 at [redacted]w[redacted]d being seen today for ongoing prenatal care.  She is currently monitored for the following issues for this high-risk pregnancy and has PSORIASIS; VSD; Supervision of normal first pregnancy, antepartum; and White coat syndrome without diagnosis of hypertension on their problem list.  ----------------------------------------------------------------------------------- Patient reports no complaints.   Contractions: Not present. Vag. Bleeding: None.  Movement: Present. Denies leaking of fluid.  ----------------------------------------------------------------------------------- The following portions of the patient's history were reviewed and updated as appropriate: allergies, current medications, past family history, past medical history, past social history, past surgical history and problem list. Problem list updated.   Objective  Blood pressure 130/70, weight 186 lb (84.4 kg), last menstrual period 06/12/2017. Pregravid weight 171 lb (77.6 kg) Total Weight Gain 15 lb (6.804 kg) Urinalysis: Urine Protein: Negative Urine Glucose: Negative  Fetal Status: Fetal Heart Rate (bpm): 155   Movement: Present     General:  Alert, oriented and cooperative. Patient is in no acute distress.  Skin: Skin is warm and dry. No rash noted.   Cardiovascular: Normal heart rate noted  Respiratory: Normal respiratory effort, no problems with respiration noted  Abdomen: Soft, gravid, appropriate for gestational age. Pain/Pressure: Absent     Pelvic:  Cervical exam deferred        Extremities: Normal range of motion.     ental Status: Normal mood and affect. Normal behavior. Normal judgment and thought content.     Assessment   27 y.o. G1P0000 at [redacted]w[redacted]d by  03/19/2018, by Last Menstrual Period presenting for routine prenatal visit  Plan   FIRST Problems (from 08/06/17 to present)    Problem Noted Resolved   White  coat syndrome without diagnosis of hypertension 09/30/2017 by Vena Austria, MD No   Overview Signed 09/30/2017  9:28 PM by Vena Austria, MD     Aspirin 81 mg daily after 12 weeks; discontinue after 36 weeks  baseline labs with CBC, CMP, urine protein/creatinine ratio  no BP meds unless BPs become elevated  ultrasound for growth at 28, 32, 36 weeks    Current antihypertensives:  None   Baseline and surveillance labs (pulled in from [facility], refresh links as needed)  Lab Results  Component Value Date   PLT 270 08/06/2017   CREATININE 0.57 09/13/2017   AST 18 09/13/2017   ALT 15 09/13/2017    Antenatal Testing CHTN - O10.919  Group I  BP < 140/90, no preeclampsia, AGA,  nml AFV, +/- meds    Group II BP > 140/90, on meds, no preeclampsia, AGA, nml AFV  20-28-34-38  20-24-28-32-35-38  32//2 x wk  28//BPP wkly then 32//2 x wk  40 no meds; 39 meds  PRN or 37  Pre-eclampsia  GHTN - O13.9/Preeclampsia without severe features  - O14.00   Preeclampsia with severe features - O14.10  Q 3-4wks  Q 2 wks  28//BPP wkly then 32//2 x wk  Inpatient  37  PRN or 34        Supervision of normal first pregnancy, antepartum 08/06/2017 by Oswaldo Conroy, CNM No   Overview Addendum 09/30/2017  9:29 PM by Vena Austria, MD    Clinic [facility] Prenatal Labs  Dating LMP = 9week [place] Blood type: A/Positive/-- (03/08 1446)   Genetic Screen 1 Screen: negative Antibody:Negative (03/08 1446)  Anatomic [place]  Rubella: 1.47 (03/08 1446) Varicella:    GTT  RPR: Non Reactive (03/08 1446)  Rhogam Not applicable HBsAg: Negative (03/08 1446)   TDaP vaccine                      HIV: Non Reactive (03/08 1446)   Baby Food                                GBS:   Contraception  Pap: 11/02/2016, NILM  CBB     CS/VBAC    Support Person                  Gestational age appropriate obstetric precautions including but not limited to vaginal bleeding, contractions, leaking of fluid and  fetal movement were reviewed in detail with the patient.    Discussed her blood pressures, she has been taking a daily 81 mg aspirin. 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 She checks her blood pressures there and says that they are generally in the 120s systolic. Asked her to continue to take them weekly and alert [place] if they become elevated more than 140/90.   VSD- patient has a cardiologist through [facility] (Dr. Alvino Chapel, Karie Kirks)  but she has not seen them in 2 years (11/11/2015). Asked her to call and schedule an appointment so they can be a part of her pregnancy care. Patient will need an updated ECHO.  Asked her to let us know if she has difficulty making an appointment in the next 1-2 months. She reports that her VSD is small and that she has never had any complications or restrictions from it.  Will also place referral to [facility] perinatology for consultation and guidance on management and fetal echo.    Discussed volunteer Doula program available through [facility].   Return in about 1 month (around 11/24/2017) for ROB.  Adelene Idler MD [facility], [facility] 10/27/17 5:26 PM  From UpToDate :  MANAGEMENT OF PREGNANCY-Women with VSDs should receive preconception counseling concerning the risks of pregnancy as well as the risk of congenital heart disease (CHD) in offspring. The offspring of women with VSDs have a risk of CHD of 3 to 7 percent [74]. (See "Pregnancy in women with congenital heart disease: General principles".) The risks of pregnancy in women with a VSD varies according to the functional size of the defect and associated complications such as pulmonary hypertension (PH) and HF: ?Women of reproductive age with small VSDs as well as those with successfully repaired VSDs without significant residua can complete pregnancy with no increase in risk to the mother or fetus. The risk is  small when the shunt ratio is <1.7 with normal pulmonary pressure and preserved functional aerobic capacity.  ?Women with larger VSD shunts and those with a history of arrhythmias, ventricular dysfunction, or PH are at higher risk of developing cardiovascular pregnancy-related complications than VSD patients without these features. The most common complications include arrhythmias and HF. High risk is posed by VSD in combination with moderate or greater pulmonic stenosis, PH, double-chambered right ventricle, or LV ejection fraction

## 2017-10-27 NOTE — Progress Notes (Signed)
Pt reports no problems. Anatomy scan today. It's a BOY!  

## 2017-10-29 ENCOUNTER — Other Ambulatory Visit: Payer: Self-pay | Admitting: Obstetrics and Gynecology

## 2017-11-08 ENCOUNTER — Ambulatory Visit
Admission: RE | Admit: 2017-11-08 | Discharge: 2017-11-08 | Disposition: A | Discharge status: Home / Self Care | Payer: 59 | Source: Ambulatory Visit | Attending: Obstetrics and Gynecology | Admitting: Obstetrics and Gynecology

## 2017-11-08 ENCOUNTER — Ambulatory Visit: Payer: Managed Care, Other (non HMO)

## 2017-11-08 VITALS — BP 134/57 | HR 74 | Temp 98.1°F | Resp 18 | Ht 66.0 in | Wt 187.6 lb

## 2017-11-08 DIAGNOSIS — L408 Other psoriasis: Secondary | ICD-10-CM

## 2017-11-08 DIAGNOSIS — Q210 Ventricular septal defect: Secondary | ICD-10-CM

## 2017-11-08 DIAGNOSIS — O0992 Supervision of high risk pregnancy, unspecified, second trimester: Secondary | ICD-10-CM

## 2017-11-08 DIAGNOSIS — R030 Elevated blood-pressure reading, without diagnosis of hypertension: Secondary | ICD-10-CM

## 2017-11-08 NOTE — Progress Notes (Signed)
[facility]ation   Chief Complaint: heart murmur  HPI: Ms. Tiffany SiresKimberly Conner is a 27 y.o. G1P0000 at 684w2d by LMP consistent with 9 week ultrasound who [facility] for recommendations regarding maternal history of VSD which was diagnosed at birth.  The last time she saw a cardiologist was in 2017.  She has no issues with exercise intolerance (lifts weights 5-6 days/week).  Past Medical History: Patient  has a past medical history of Heart murmur and VSD (ventricular septal defect). Psoriasis.  White coat hypertension. Past Surgical History: She  has a past surgical history that includes Myringotomy and Tibia fracture surgery. The latter of which required rod placement. Obstetric History:  OB History    Gravida  1   Para  0   Term  0   Preterm  0   AB  0   Living  0     SAB  0   TAB  0   Ectopic  0   Multiple  0   Live Births  0          Gynecologic History:  Patient's last menstrual period was 06/12/2017.  Denies history of abnormal PAPs or STDs.  Medications: PNVs, clobetasol PRN, 81 mg ASA daily Allergies: Patient has No Known Allergies.  Social History: Patient  reports that she has never smoked. She has never used smokeless tobacco. She reports that she does not drink alcohol or use drugs. She is married and works in [employer] in [employer]. Family History: family history includes Ovarian cancer in her other.  Review of Systems A full 12 point review of systems was negative or as noted in the History of Present Illness.  Specifically she denies SOB, chest pain, lightheadedness with increase in HR or position changes.  Physical Exam: BP (!) 134/57   Pulse 74   Temp 98.1 F (36.7 C)   Resp 18   Ht 5\' 6"  (1.676 m)   Wt 187 lb 9.6 oz (85.1 kg)   LMP 06/12/2017   BMI 30.28 kg/m    Maternal echo 11/26/2015: Study Conclusions  - Left ventricle: The cavity size was normal.  Systolic function was   normal. The estimated ejection fraction was in the range of 55%   to 60%. Wall motion was normal; there were no regional wall   motion abnormalities. Left ventricular diastolic function   parameters were normal. - Left atrium: The atrium was normal in size. - Right ventricle: Systolic function was normal. - Pulmonary arteries: Systolic pressure was within the normal   range.  Impressions:  - Very small supracristal VSD noted, with small doppler flow noted  Asessement: 1. VSD   2. White coat syndrome without diagnosis of hypertension   3. Pregnancy, supervision, high-risk, second trimester   4. PSORIASIS     Plan: 1.  VSD.  Recommend continued baby ASA.  [name] has a scheduled cardiology appointment (follow up) in July - recommend repeat maternal echocardiogram.  Given that she is already > 20 weeks without symptoms and has normal exercise tolerance, she is likely to tolerate the remainder of the pregnancy and delivery without complications.  If echo findings remain stable, would not require antibiotics for endocarditis prophylaxis.  No contraindications to normal vaginal delivery.  Recommend fetal echocardiogram given her history of CHD and increased risk for fetal CHD in that setting.  Although [facility] weeks was reported to be normal,  will order fetal echocardiogram with Pediatric Cardiology.  Consider anesthesia consult in the third trimester.  2.  HTN - mild on no medications.  She takes her blood pressure weekly at work and systolic BP is generallyreported to remain <130.  Baseline p/c ratio was 172, CMP was normal.  Would not start antihypertensive medication as long as patient remains asymptomatic and BPs remain in mild range.  3. Psoriasis - clobetasol as needed.   Total time spent with the patient was 30 minutes with greater than 50% spent in counseling and coordination of care. We appreciate  this interesting consult and will be happy to be involved in the ongoing care of Ms. Zhan in anyway her obstetricians desire.  Kirby Funk, MD Maternal-Fetal Medicine [facility]

## 2017-11-24 ENCOUNTER — Ambulatory Visit (INDEPENDENT_AMBULATORY_CARE_PROVIDER_SITE_OTHER): Payer: 59 | Admitting: Obstetrics and Gynecology

## 2017-11-24 ENCOUNTER — Encounter: Payer: Self-pay | Admitting: Obstetrics and Gynecology

## 2017-11-24 VITALS — BP 132/74 | Wt 192.0 lb

## 2017-11-24 DIAGNOSIS — O0992 Supervision of high risk pregnancy, unspecified, second trimester: Secondary | ICD-10-CM

## 2017-11-24 DIAGNOSIS — Q210 Ventricular septal defect: Secondary | ICD-10-CM

## 2017-11-24 DIAGNOSIS — Z3A23 23 weeks gestation of pregnancy: Secondary | ICD-10-CM

## 2017-11-24 DIAGNOSIS — Z113 Encounter for screening for infections with a predominantly sexual mode of transmission: Secondary | ICD-10-CM

## 2017-11-24 DIAGNOSIS — O9989 Other specified diseases and conditions complicating pregnancy, childbirth and the puerperium: Secondary | ICD-10-CM

## 2017-11-24 DIAGNOSIS — Z131 Encounter for screening for diabetes mellitus: Secondary | ICD-10-CM

## 2017-11-24 DIAGNOSIS — O99412 Diseases of the circulatory system complicating pregnancy, second trimester: Secondary | ICD-10-CM

## 2017-11-24 DIAGNOSIS — R030 Elevated blood-pressure reading, without diagnosis of hypertension: Secondary | ICD-10-CM

## 2017-11-24 NOTE — Progress Notes (Incomplete)
Routine Prenatal Care Visit  Subjective  Tiffany Conner is a 27 y.o. G1P0000 at 6514w4d being seen today for ongoing prenatal care.  She is currently monitored for the following issues for this {Blank single:19197::"high-risk","low-risk"} pregnancy and has PSORIASIS; VSD; Pregnancy, supervision, high-risk, second trimester; and White coat syndrome without diagnosis of hypertension on their problem list.  ----------------------------------------------------------------------------------- Patient reports {sx:14538}.   Contractions: Not present. Vag. Bleeding: None.  Movement: Present. Denies leaking of fluid.  ----------------------------------------------------------------------------------- The following portions of the patient's history were reviewed and updated as appropriate: allergies, current medications, past family history, past medical history, past social history, past surgical history and problem list. Problem list updated.   Objective  Blood pressure 132/74, weight 192 lb (87.1 kg), last menstrual period 06/12/2017. Pregravid weight 171 lb (77.6 kg) Total Weight Gain 21 lb (9.526 kg) Urinalysis: Urine Protein: Negative Urine Glucose: Negative  Fetal Status: Fetal Heart Rate (bpm): 150 Fundal Height: 24 cm Movement: Present     General:  Alert, oriented and cooperative. Patient is in no acute distress.  Skin: Skin is warm and dry. No rash noted.   Cardiovascular: Normal heart rate noted  Respiratory: Normal respiratory effort, no problems with respiration noted  Abdomen: Soft, gravid, appropriate for gestational age. Pain/Pressure: Absent     Pelvic:  {Blank single:19197::"Cervical exam performed","Cervical exam deferred"}        Extremities: Normal range of motion.     Mental Status: Normal mood and affect. Normal behavior. Normal judgment and thought content.   Assessment   27 y.o. G1P0000 at 514w4d by  03/19/2018, by Last Menstrual Period presenting for {Blank  single:19197::"routine","work-in"} prenatal visit  Plan   FIRST Problems (from 08/06/17 to present)    Problem Noted Resolved   White coat syndrome without diagnosis of hypertension 09/30/2017 by Vena AustriaStaebler, Andreas, MD No   Overview Signed 09/30/2017  9:28 PM by Vena AustriaStaebler, Andreas, MD    [X]  Aspirin 81 mg daily after 12 weeks; discontinue after 36 weeks [X]  baseline labs with CBC, CMP, urine protein/creatinine ratio [ ]  no BP meds unless BPs become elevated [ ]  ultrasound for growth at 28, 32, 36 weeks    Current antihypertensives:  None   Baseline and surveillance labs ([facility], refresh links as needed)  Lab Results  Component Value Date   PLT 270 08/06/2017   CREATININE 0.57 09/13/2017   AST 18 09/13/2017   ALT 15 09/13/2017    Antenatal Testing CHTN - O10.919  Group I  BP < 140/90, no preeclampsia, AGA,  nml AFV, +/- meds    Group II BP > 140/90, on meds, no preeclampsia, AGA, nml AFV  20-28-34-38  20-24-28-32-35-38  32//2 x wk  28//BPP wkly then 32//2 x wk  40 no meds; 39 meds  PRN or 37  Pre-eclampsia  GHTN - O13.9/Preeclampsia without severe features  - O14.00   Preeclampsia with severe features - O14.10  Q 3-4wks  Q 2 wks  28//BPP wkly then 32//2 x wk  Inpatient  37  PRN or 34        Pregnancy, supervision, high-risk, second trimester 08/06/2017 by Oswaldo ConroySchmid, Jacelyn Y, CNM No   Overview Addendum 09/30/2017  9:29 PM by Vena AustriaStaebler, Andreas, MD    [facility] Prenatal Labs  Dating LMP = 9week US Blood type: A/Positive/-- (03/08 1446)   Genetic Screen 1 Screen: negative Antibody:Negative (03/08 1446)  Anatomic US  Rubella: 1.47 (03/08 1446) Varicella:    GTT  RPR: Non Reactive (03/08 1446)  Rhogam Not applicable HBsAg: Negative (03/08 1446)   TDaP vaccine                      HIV: Non Reactive (03/08 1446)   Baby Food                                GBS:   Contraception  Pap: 11/02/2016, NILM  CBB     CS/VBAC    Support Person                   {Blank single:19197::"Term","Preterm"} labor symptoms and general obstetric precautions including but not limited to vaginal bleeding, contractions, leaking of fluid and fetal movement were reviewed in detail with the patient. Please refer to After Visit Summary for other counseling recommendations.   Return in about 1 month (around 12/22/2017) for schedule u/s for growth/afi, schedule 28 week labs with routine prenatal.  Thomasene Mohair, MD, [facility], [facility] 11/24/2017 4:32 PM

## 2017-11-24 NOTE — Progress Notes (Signed)
Routine Prenatal Care Visit  Subjective  Tiffany Conner is a 27 y.o. G1P0000 at 6213w4d being seen today for ongoing prenatal care.  She is currently monitored for the following issues for this high-risk pregnancy and has PSORIASIS; VSD; Pregnancy, supervision, high-risk, second trimester; and White coat syndrome without diagnosis of hypertension on their problem list.  ----------------------------------------------------------------------------------- Patient reports no complaints.   Contractions: Not present. Vag. Bleeding: None.  Movement: Present. Denies leaking of fluid.  ----------------------------------------------------------------------------------- The following portions of the patient's history were reviewed and updated as appropriate: allergies, current medications, past family history, past medical history, past social history, past surgical history and problem list. Problem list updated.   Objective  Blood pressure 132/74, weight 192 lb (87.1 kg), last menstrual period 06/12/2017. Pregravid weight 171 lb (77.6 kg) Total Weight Gain 21 lb (9.526 kg) Urinalysis: Urine Protein: Negative Urine Glucose: Negative  Fetal Status: Fetal Heart Rate (bpm): 150 Fundal Height: 24 cm Movement: Present     General:  Alert, oriented and cooperative. Patient is in no acute distress.  Skin: Skin is warm and dry. No rash noted.   Cardiovascular: Normal heart rate noted  Respiratory: Normal respiratory effort, no problems with respiration noted  Abdomen: Soft, gravid, appropriate for gestational age. Pain/Pressure: Absent     Pelvic:  Cervical exam deferred        Extremities: Normal range of motion.     Mental Status: Normal mood and affect. Normal behavior. Normal judgment and thought content.   Assessment   27 y.o. G1P0000 at 4613w4d by  03/19/2018, by Last Menstrual Period presenting for routine prenatal visit  Plan   FIRST Problems (from 08/06/17 to present)    Problem Noted  Resolved   White coat syndrome without diagnosis of hypertension 09/30/2017 by Vena AustriaStaebler, Andreas, MD No   Overview Signed 09/30/2017  9:28 PM by Vena AustriaStaebler, Andreas, MD    [X]  Aspirin 81 mg daily after 12 weeks; discontinue after 36 weeks [X]  baseline labs with CBC, CMP, urine protein/creatinine ratio [ ]  no BP meds unless BPs become elevated [ ]  ultrasound for growth at 28, 32, 36 weeks    Current antihypertensives:  None   Baseline and surveillance labs ([facility], refresh links as needed)  Lab Results  Component Value Date   PLT 270 08/06/2017   CREATININE 0.57 09/13/2017   AST 18 09/13/2017   ALT 15 09/13/2017    Antenatal Testing CHTN - O10.919  Group I  BP < 140/90, no preeclampsia, AGA,  nml AFV, +/- meds    Group II BP > 140/90, on meds, no preeclampsia, AGA, nml AFV  20-28-34-38  20-24-28-32-35-38  32//2 x wk  28//BPP wkly then 32//2 x wk  40 no meds; 39 meds  PRN or 37  Pre-eclampsia  GHTN - O13.9/Preeclampsia without severe features  - O14.00   Preeclampsia with severe features - O14.10  Q 3-4wks  Q 2 wks  28//BPP wkly then 32//2 x wk  Inpatient  37  PRN or 34        Pregnancy, supervision, high-risk, second trimester 08/06/2017 by Oswaldo ConroySchmid, Jacelyn Y, CNM No   Overview Addendum 09/30/2017  9:29 PM by Vena AustriaStaebler, Andreas, MD    [facility] Prenatal Labs  Dating LMP = 9week US Blood type: A/Positive/-- (03/08 1446)   Genetic Screen 1 Screen: negative Antibody:Negative (03/08 1446)  Anatomic US  Rubella: 1.47 (03/08 1446) Varicella:    GTT  RPR: Non Reactive (03/08 1446)   Rhogam Not applicable  HBsAg: Negative (03/08 1446)   TDaP vaccine                      HIV: Non Reactive (03/08 1446)   Baby Food                                GBS:   Contraception  Pap: 11/02/2016, NILM  CBB     CS/VBAC    Support Person                  Preterm labor symptoms and general obstetric precautions including but not limited to vaginal bleeding,  contractions, leaking of fluid and fetal movement were reviewed in detail with the patient. Please refer to After Visit Summary for other counseling recommendations.   Kateri Mc MFM consult note reviewed.   Return in about 1 month (around 12/22/2017) for schedule u/s for growth/afi, schedule 28 week labs with routine prenatal.  Thomasene Mohair, MD, [facility] 11/24/2017 4:32 PM

## 2017-12-03 NOTE — Progress Notes (Signed)
Cardiology Office Note  Date:  12/06/2017   ID:  Tiffany Conner, DOB [birth year], MRN [id number]  PCP:  Dianne Dun, MD   Chief Complaint  Patient presents with  . Other    Past due 12 month follow up. Patient denies chest pain and SOB. Patient was last seen 11/11/2015. Meds reviewed verbally with patient.     HPI:  Tiffany Conner is a 72 short woman with past medical history of supracristal VSD HTN  evaluated for by her pediatric cardiologist was 06/13/2009. Echocardiogram 2006, 2011 in 2017 Who presents for follow-up of her VSD  Reports that she is [redacted] weeks pregnant Overall feels well with no complaints Reports that blood pressures typically well controlled at home and he 130 range systolic Was told to take aspirin for her hypertension by OB/GYN May 2019  Previous echocardiograms reviewed with her in detail  very small supracristal VSD,  Normal right heart size and function with no elevated right heart pressures  No history of cyanosis. Good exercise tolerance  EKG personally reviewed by myself on todays visit Shows normal sinus rhythm with rate 78 bpm nonspecific T wave abnormality No change from previous EKGs    PMH:   has a past medical history of Heart murmur and VSD (ventricular septal defect).  PSH:    Past Surgical History:  Procedure Laterality Date  . MYRINGOTOMY    . TIBIA FRACTURE SURGERY      Current Outpatient Medications  Medication Sig Dispense Refill  . aspirin 81 MG tablet     . cetirizine (ZYRTEC) 10 MG chewable tablet Chew 10 mg by mouth daily.    . Clobetasol Propionate Emulsion 0.05 % topical foam     . Prenatal Vit-Fe Fumarate-FA (PRENATAL MULTIVITAMIN) TABS tablet Take 1 tablet by mouth daily at 12 noon.     No current facility-administered medications for this visit.      Allergies:   Patient has no known allergies.   Social History:  The patient  reports that she has never smoked. She has never used smokeless tobacco. She  reports that she does not drink alcohol or use drugs.   Family History:   family history includes Ovarian cancer in her other.    Review of Systems: Review of Systems  Constitutional: Negative.   Respiratory: Negative.   Cardiovascular: Negative.   Gastrointestinal: Negative.   Musculoskeletal: Negative.   Neurological: Negative.   Psychiatric/Behavioral: Negative.   All other systems reviewed and are negative.    PHYSICAL EXAM: VS:  BP (!) 143/65 (BP Location: Left Arm, Patient Position: Sitting, Cuff Size: Normal)   Pulse 78   Ht 5\' 6"  (1.676 m)   Wt 192 lb (87.1 kg)   LMP 06/12/2017   BMI 30.99 kg/m  , BMI Body mass index is 30.99 kg/m. GEN: Well nourished, well developed, in no acute distress  HEENT: normal  Neck: no JVD, carotid bruits, or masses Cardiac: RRR; no murmurs, rubs, or gallops,no edema  Respiratory:  clear to auscultation bilaterally, normal work of breathing GI: soft, nontender, nondistended, + BS MS: no deformity or atrophy  Skin: warm and dry, no rash Neuro:  Strength and sensation are intact Psych: euthymic mood, full affect   Recent Labs: 08/06/2017: Hemoglobin 13.3; Platelets 270 09/13/2017: ALT 15; BUN 13; Creatinine, Ser 0.57; Potassium 4.4; Sodium 140    Lipid Panel No results found for: CHOL, HDL, LDLCALC, TRIG    Wt Readings from Last 3 Encounters:  12/06/17 192 lb (87.1  kg)  11/24/17 192 lb (87.1 kg)  11/08/17 187 lb 9.6 oz (85.1 kg)       ASSESSMENT AND PLAN:  VSD - Plan: EKG 12-Lead Very small VSD, no indication for echocardiogram at this time No change in clinical status  White coat syndrome without diagnosis of hypertension - Plan: EKG 12-Lead Recommended she monitor blood pressure at home No indication for aspirin in the treatment of hypertension from cardiac perspective She will discuss with OB/GYN and present them with home blood pressure measurements  Disposition:   F/U  12 months as needed   Orders Placed This  Encounter  Procedures  . EKG 12-Lead    Signed, Dossie Arbourim Aquilla Voiles, M.D., Ph.D. 12/06/2017  [facility] [place] [phone]

## 2017-12-06 ENCOUNTER — Ambulatory Visit: Payer: 59 | Admitting: Cardiovascular Disease

## 2017-12-06 ENCOUNTER — Encounter: Payer: Self-pay | Admitting: Cardiovascular Disease

## 2017-12-06 VITALS — BP 143/65 | HR 78 | Ht 66.0 in | Wt 192.0 lb

## 2017-12-06 DIAGNOSIS — Q210 Ventricular septal defect: Secondary | ICD-10-CM

## 2017-12-06 DIAGNOSIS — R030 Elevated blood-pressure reading, without diagnosis of hypertension: Secondary | ICD-10-CM

## 2017-12-06 DIAGNOSIS — O0992 Supervision of high risk pregnancy, unspecified, second trimester: Secondary | ICD-10-CM

## 2017-12-06 NOTE — Patient Instructions (Addendum)

## 2017-12-22 ENCOUNTER — Encounter: Payer: Self-pay | Admitting: Advanced Practice Midwife

## 2017-12-22 ENCOUNTER — Other Ambulatory Visit: Payer: 59

## 2017-12-22 ENCOUNTER — Ambulatory Visit (INDEPENDENT_AMBULATORY_CARE_PROVIDER_SITE_OTHER): Payer: 59

## 2017-12-22 ENCOUNTER — Ambulatory Visit (INDEPENDENT_AMBULATORY_CARE_PROVIDER_SITE_OTHER): Payer: 59 | Admitting: Advanced Practice Midwife

## 2017-12-22 VITALS — BP 136/62 | Wt 194.0 lb

## 2017-12-22 DIAGNOSIS — Z3A27 27 weeks gestation of pregnancy: Secondary | ICD-10-CM | POA: Diagnosis not present

## 2017-12-22 DIAGNOSIS — Z113 Encounter for screening for infections with a predominantly sexual mode of transmission: Secondary | ICD-10-CM

## 2017-12-22 DIAGNOSIS — O9989 Other specified diseases and conditions complicating pregnancy, childbirth and the puerperium: Secondary | ICD-10-CM | POA: Diagnosis not present

## 2017-12-22 DIAGNOSIS — R030 Elevated blood-pressure reading, without diagnosis of hypertension: Secondary | ICD-10-CM | POA: Diagnosis not present

## 2017-12-22 DIAGNOSIS — Z131 Encounter for screening for diabetes mellitus: Secondary | ICD-10-CM

## 2017-12-22 DIAGNOSIS — O0992 Supervision of high risk pregnancy, unspecified, second trimester: Secondary | ICD-10-CM

## 2017-12-22 NOTE — Progress Notes (Signed)
Routine Prenatal Care Visit  Subjective  Tiffany Conner is a 27 y.o. G1P0000 at [redacted]w[redacted]d being seen today for ongoing prenatal care.  She is currently monitored for the following issues for this high-risk pregnancy and has PSORIASIS; VSD; Pregnancy, supervision, high-risk, second trimester; and White coat syndrome without diagnosis of hypertension on their problem list.  ----------------------------------------------------------------------------------- Patient reports backache and gluteous muscles. Reviewed comfort measures. Discussed possibility of flexeril if pain persists or worsens..   Contractions: Not present. Vag. Bleeding: None.  Movement: Present. Denies leaking of fluid.  ----------------------------------------------------------------------------------- The following portions of the patient's history were reviewed and updated as appropriate: allergies, current medications, past family history, past medical history, past social history, past surgical history and problem list. Problem list updated.   Objective  Blood pressure 136/62, weight 194 lb (88 kg), last menstrual period 06/12/2017. Pregravid weight 171 lb (77.6 kg) Total Weight Gain 23 lb (10.4 kg) Urinalysis: Urine Protein: Negative Urine Glucose: Negative  Fetal Status: Fetal Heart Rate (bpm): 158   Movement: Present  Presentation: Transverse  Growth scan today: 2 pounds 1 ounce, 32%, AFI subjectively normal  General:  Alert, oriented and cooperative. Patient is in no acute distress.  Skin: Skin is warm and dry. No rash noted.   Cardiovascular: Normal heart rate noted  Respiratory: Normal respiratory effort, no problems with respiration noted  Abdomen: Soft, gravid, appropriate for gestational age. Pain/Pressure: Absent     Pelvic:  Cervical exam deferred        Extremities: Normal range of motion.  Edema: None  Mental Status: Normal mood and affect. Normal behavior. Normal judgment and thought content.   Assessment     27 y.o. G1P0000 at [redacted]w[redacted]d by  03/19/2018, by Last Menstrual Period presenting for routine prenatal visit  Plan   FIRST Problems (from 08/06/17 to present)    Problem Noted Resolved   White coat syndrome without diagnosis of hypertension 09/30/2017 by Vena Austria, MD No   Overview Signed 09/30/2017  9:28 PM by Vena Austria, MD    [X]  Aspirin 81 mg daily after 12 weeks; discontinue after 36 weeks [X]  baseline labs with CBC, CMP, urine protein/creatinine ratio [ ]  no BP meds unless BPs become elevated [ ]  ultrasound for growth at 28, 32, 36 weeks    Current antihypertensives:  None   Baseline and surveillance labs (pulled in from [facility], refresh links as needed)  Lab Results  Component Value Date   PLT 270 08/06/2017   CREATININE 0.57 09/13/2017   AST 18 09/13/2017   ALT 15 09/13/2017    Antenatal Testing CHTN - O10.919  Group I  BP < 140/90, no preeclampsia, AGA,  nml AFV, +/- meds    Group II BP > 140/90, on meds, no preeclampsia, AGA, nml AFV  20-28-34-38  20-24-28-32-35-38  32//2 x wk  28//BPP wkly then 32//2 x wk  40 no meds; 39 meds  PRN or 37  Pre-eclampsia  GHTN - O13.9/Preeclampsia without severe features  - O14.00   Preeclampsia with severe features - O14.10  Q 3-4wks  Q 2 wks  28//BPP wkly then 32//2 x wk  Inpatient  37  PRN or 34        Pregnancy, supervision, high-risk, second trimester 08/06/2017 by Oswaldo Conroy, CNM No   Overview Addendum 09/30/2017  9:29 PM by Vena Austria, MD    [facility] Prenatal Labs  Dating LMP = 9week [place] Blood type: A/Positive/-- (03/08 1446)   Genetic Screen 1 Screen: negative Antibody:Negative (03/08  1446)  Anatomic US  Rubella: 1.47 (03/08 1446) Varicella:    GTT  RPR: Non Reactive (03/08 1446)   Rhogam Not applicable HBsAg: Negative (03/08 1446)   TDaP vaccine                      HIV: Non Reactive (03/08 1446)   Baby Food                                GBS:   Contraception  Pap: 11/02/2016,  NILM  CBB     CS/VBAC    Support Person                  Preterm labor symptoms and general obstetric precautions including but not limited to vaginal bleeding, contractions, leaking of fluid and fetal movement were reviewed in detail with the patient. Please refer to After Visit Summary for other counseling recommendations.   Return in about 2 weeks (around 01/05/2018) for rob.  Tresea MallJane Jermaine Tholl, CNM 12/22/2017 3:44 PM

## 2017-12-22 NOTE — Patient Instructions (Signed)
Back Pain in Pregnancy Back pain during pregnancy is common. Back pain may be caused by several factors that are related to changes during your pregnancy. Follow these instructions at home: Managing pain, stiffness, and swelling  If directed, apply ice for sudden (acute) back pain. ? Put ice in a plastic bag. ? Place a towel between your skin and the bag. ? Leave the ice on for 20 minutes, 2-3 times per day.  If directed, apply heat to the affected area before you exercise: ? Place a towel between your skin and the heat pack or heating pad. ? Leave the heat on for 20-30 minutes. ? Remove the heat if your skin turns bright red. This is especially important if you are unable to feel pain, heat, or cold. You may have a greater risk of getting burned. Activity  Exercise as told by your health care provider. Exercising is the best way to prevent or manage back pain.  Listen to your body when lifting. If lifting hurts, ask for help or bend your knees. This uses your leg muscles instead of your back muscles.  Squat down when picking up something from the floor. Do not bend over.  Only use bed rest as told by your health care provider. Bed rest should only be used for the most severe episodes of back pain. Standing, Sitting, and Lying Down  Do not stand in one place for long periods of time.  Use good posture when sitting. Make sure your head rests over your shoulders and is not hanging forward. Use a pillow on your lower back if necessary.  Try sleeping on your side, preferably the left side, with a pillow or two between your legs. If you are sore after a night's rest, your bed may be too soft. A firm mattress may provide more support for your back during pregnancy. General instructions  Do not wear high heels.  Eat a healthy diet. Try to gain weight within your health care provider's recommendations.  Use a maternity girdle, elastic sling, or back brace as told by your health care  provider.  Take over-the-counter and prescription medicines only as told by your health care provider.  Keep all follow-up visits as told by your health care provider. This is important. This includes any visits with any specialists, such as a physical therapist. Contact a health care provider if:  Your back pain interferes with your daily activities.  You have increasing pain in other parts of your body. Get help right away if:  You develop numbness, tingling, weakness, or problems with the use of your arms or legs.  You develop severe back pain that is not controlled with medicine.  You have a sudden change in bowel or bladder control.  You develop shortness of breath, dizziness, or you faint.  You develop nausea, vomiting, or sweating.  You have back pain that is a rhythmic, cramping pain similar to labor pains. Labor pain is usually 1-2 minutes apart, lasts for about 1 minute, and involves a bearing down feeling or pressure in your pelvis.  You have back pain and your water breaks or you have vaginal bleeding.  You have back pain or numbness that travels down your leg.  Your back pain developed after you fell.  You develop pain on one side of your back.  You see blood in your urine.  You develop skin blisters in the area of your back pain. This information is not intended to replace advice given to you   by your health care provider. Make sure you discuss any questions you have with your health care provider. Document Released: 08/26/2005 Document Revised: 10/24/2015 Document Reviewed: 01/30/2015 Elsevier Interactive Patient Education  2018 [employer]. Third Trimester of Pregnancy The third trimester is from week 28 through week 40 (months 7 through 9). The third trimester is a time when the unborn baby (fetus) is growing rapidly. At the end of the ninth month, the fetus is about 20 inches in length and weighs 6-10 pounds. Body changes during your third trimester Your  body will continue to go through many changes during pregnancy. The changes vary from woman to woman. During the third trimester:  Your weight will continue to increase. You can expect to gain 25-35 pounds (11-16 kg) by the end of the pregnancy.  You may begin to get stretch marks on your hips, abdomen, and breasts.  You may urinate more often because the fetus is moving lower into your pelvis and pressing on your bladder.  You may develop or continue to have heartburn. This is caused by increased hormones that slow down muscles in the digestive tract.  You may develop or continue to have constipation because increased hormones slow digestion and cause the muscles that push waste through your intestines to relax.  You may develop hemorrhoids. These are swollen veins (varicose veins) in the rectum that can itch or be painful.  You may develop swollen, bulging veins (varicose veins) in your legs.  You may have increased body aches in the pelvis, back, or thighs. This is due to weight gain and increased hormones that are relaxing your joints.  You may have changes in your hair. These can include thickening of your hair, rapid growth, and changes in texture. Some women also have hair loss during or after pregnancy, or hair that feels dry or thin. Your hair will most likely return to normal after your baby is born.  Your breasts will continue to grow and they will continue to become tender. A yellow fluid (colostrum) may leak from your breasts. This is the first milk you are producing for your baby.  Your belly button may stick out.  You may notice more swelling in your hands, face, or ankles.  You may have increased tingling or numbness in your hands, arms, and legs. The skin on your belly may also feel numb.  You may feel short of breath because of your expanding uterus.  You may have more problems sleeping. This can be caused by the size of your belly, increased need to urinate, and an  increase in your body's metabolism.  You may notice the fetus "dropping," or moving lower in your abdomen (lightening).  You may have increased vaginal discharge.  You may notice your joints feel loose and you may have pain around your pelvic bone.  What to expect at prenatal visits You will have prenatal exams every 2 weeks until week 36. Then you will have weekly prenatal exams. During a routine prenatal visit:  You will be weighed to make sure you and the baby are growing normally.  Your blood pressure will be taken.  Your abdomen will be measured to track your baby's growth.  The fetal heartbeat will be listened to.  Any test results from the previous visit will be discussed.  You may have a cervical check near your due date to see if your cervix has softened or thinned (effaced).  You will be tested for Group B streptococcus. This happens between  35 and 37 weeks.  Your health care provider may ask you:  What your birth plan is.  How you are feeling.  If you are feeling the baby move.  If you have had any abnormal symptoms, such as leaking fluid, bleeding, severe headaches, or abdominal cramping.  If you are using any tobacco products, including cigarettes, chewing tobacco, and electronic cigarettes.  If you have any questions.  Other tests or screenings that may be performed during your third trimester include:  Blood tests that check for low iron levels (anemia).  Fetal testing to check the health, activity level, and growth of the fetus. Testing is done if you have certain medical conditions or if there are problems during the pregnancy.  Nonstress test (NST). This test checks the health of your baby to make sure there are no signs of problems, such as the baby not getting enough oxygen. During this test, a belt is placed around your belly. The baby is made to move, and its heart rate is monitored during movement.  What is false labor? False labor is a condition  in which you feel small, irregular tightenings of the muscles in the womb (contractions) that usually go away with rest, changing position, or drinking water. These are called Braxton Hicks contractions. Contractions may last for hours, days, or even weeks before true labor sets in. If contractions come at regular intervals, become more frequent, increase in intensity, or become painful, you should see your health care provider. What are the signs of labor?  Abdominal cramps.  Regular contractions that start at 10 minutes apart and become stronger and more frequent with time.  Contractions that start on the top of the uterus and spread down to the lower abdomen and back.  Increased pelvic pressure and dull back pain.  A watery or bloody mucus discharge that comes from the vagina.  Leaking of amniotic fluid. This is also known as your "water breaking." It could be a slow trickle or a gush. Let your health care provider know if it has a color or strange odor. If you have any of these signs, call your health care provider right away, even if it is before your due date. Follow these instructions at home: Medicines  Follow your health care provider's instructions regarding medicine use. Specific medicines may be either safe or unsafe to take during pregnancy.  Take a prenatal vitamin that contains at least 600 micrograms (mcg) of folic acid.  If you develop constipation, try taking a stool softener if your health care provider approves. Eating and drinking  Eat a balanced diet that includes fresh fruits and vegetables, whole grains, good sources of protein such as meat, eggs, or tofu, and low-fat dairy. Your health care provider will help you determine the amount of weight gain that is right for you.  Avoid raw meat and uncooked cheese. These carry germs that can cause birth defects in the baby.  If you have low calcium intake from food, talk to your health care provider about whether you  should take a daily calcium supplement.  Eat four or five small meals rather than three large meals a day.  Limit foods that are high in fat and processed sugars, such as fried and sweet foods.  To prevent constipation: ? Drink enough fluid to keep your urine clear or pale yellow. ? Eat foods that are high in fiber, such as fresh fruits and vegetables, whole grains, and beans. Activity  Exercise only as directed by  your health care provider. Most women can continue their usual exercise routine during pregnancy. Try to exercise for 30 minutes at least 5 days a week. Stop exercising if you experience uterine contractions.  Avoid heavy lifting.  Do not exercise in extreme heat or humidity, or at high altitudes.  Wear low-heel, comfortable shoes.  Practice good posture.  You may continue to have sex unless your health care provider tells you otherwise. Relieving pain and discomfort  Take frequent breaks and rest with your legs elevated if you have leg cramps or low back pain.  Take warm sitz baths to soothe any pain or discomfort caused by hemorrhoids. Use hemorrhoid cream if your health care provider approves.  Wear a good support bra to prevent discomfort from breast tenderness.  If you develop varicose veins: ? Wear support pantyhose or compression stockings as told by your healthcare provider. ? Elevate your feet for 15 minutes, 3-4 times a day. Prenatal care  Write down your questions. Take them to your prenatal visits.  Keep all your prenatal visits as told by your health care provider. This is important. Safety  Wear your seat belt at all times when driving.  Make a list of emergency phone numbers, including numbers for family, friends, the hospital, and police and fire departments. General instructions  Avoid cat litter boxes and soil used by cats. These carry germs that can cause birth defects in the baby. If you have a cat, ask someone to clean the litter box for  you.  Do not travel far distances unless it is absolutely necessary and only with the approval of your health care provider.  Do not use hot tubs, steam rooms, or saunas.  Do not drink alcohol.  Do not use any products that contain nicotine or tobacco, such as cigarettes and e-cigarettes. If you need help quitting, ask your health care provider.  Do not use any medicinal herbs or unprescribed drugs. These chemicals affect the formation and growth of the baby.  Do not douche or use tampons or scented sanitary pads.  Do not cross your legs for long periods of time.  To prepare for the arrival of your baby: ? Take prenatal classes to understand, practice, and ask questions about labor and delivery. ? Make a trial run to the hospital. ? Visit the hospital and tour the maternity area. ? Arrange for maternity or paternity leave through employers. ? Arrange for family and friends to take care of pets while you are in the hospital. ? Purchase a rear-facing car seat and make sure you know how to install it in your car. ? Pack your hospital bag. ? Prepare the baby's nursery. Make sure to remove all pillows and stuffed animals from the baby's crib to prevent suffocation.  Visit your dentist if you have not gone during your pregnancy. Use a soft toothbrush to brush your teeth and be gentle when you floss. Contact a health care provider if:  You are unsure if you are in labor or if your water has broken.  You become dizzy.  You have mild pelvic cramps, pelvic pressure, or nagging pain in your abdominal area.  You have lower back pain.  You have persistent nausea, vomiting, or diarrhea.  You have an unusual or bad smelling vaginal discharge.  You have pain when you urinate. Get help right away if:  Your water breaks before 37 weeks.  You have regular contractions less than 5 minutes apart before 37 weeks.  You have a  fever.  You are leaking fluid from your vagina.  You have  spotting or bleeding from your vagina.  You have severe abdominal pain or cramping.  You have rapid weight loss or weight gain.  You have shortness of breath with chest pain.  You notice sudden or extreme swelling of your face, hands, ankles, feet, or legs.  Your baby makes fewer than 10 movements in 2 hours.  You have severe headaches that do not go away when you take medicine.  You have vision changes. Summary  The third trimester is from week 28 through week 40, months 7 through 9. The third trimester is a time when the unborn baby (fetus) is growing rapidly.  During the third trimester, your discomfort may increase as you and your baby continue to gain weight. You may have abdominal, leg, and back pain, sleeping problems, and an increased need to urinate.  During the third trimester your breasts will keep growing and they will continue to become tender. A yellow fluid (colostrum) may leak from your breasts. This is the first milk you are producing for your baby.  False labor is a condition in which you feel small, irregular tightenings of the muscles in the womb (contractions) that eventually go away. These are called Braxton Hicks contractions. Contractions may last for hours, days, or even weeks before true labor sets in.  Signs of labor can include: abdominal cramps; regular contractions that start at 10 minutes apart and become stronger and more frequent with time; watery or bloody mucus discharge that comes from the vagina; increased pelvic pressure and dull back pain; and leaking of amniotic fluid. This information is not intended to replace advice given to you by your health care provider. Make sure you discuss any questions you have with your health care provider. Document Released: 05/12/2001 Document Revised: 10/24/2015 Document Reviewed: 07/19/2012 Elsevier Interactive Patient Education  2017 [employer]

## 2017-12-23 LAB — 28 WEEK RH+PANEL
BASOS: 0 %
Basophils Absolute: 0 10*3/uL (ref 0.0–0.2)
EOS (ABSOLUTE): 0.1 10*3/uL (ref 0.0–0.4)
Eos: 1 %
Gestational Diabetes Screen: 90 mg/dL (ref 65–139)
HEMATOCRIT: 39.4 % (ref 34.0–46.6)
HEMOGLOBIN: 13.2 g/dL (ref 11.1–15.9)
HIV Screen 4th Generation wRfx: NONREACTIVE
IMMATURE GRANS (ABS): 0.1 10*3/uL (ref 0.0–0.1)
Immature Granulocytes: 1 %
LYMPHS ABS: 2.2 10*3/uL (ref 0.7–3.1)
LYMPHS: 22 %
MCH: 31.1 pg (ref 26.6–33.0)
MCHC: 33.5 g/dL (ref 31.5–35.7)
MCV: 93 fL (ref 79–97)
MONOCYTES: 8 %
MONOS ABS: 0.8 10*3/uL (ref 0.1–0.9)
NEUTROS ABS: 7 10*3/uL (ref 1.4–7.0)
NEUTROS PCT: 68 %
PLATELETS: 240 10*3/uL (ref 150–450)
RBC: 4.25 x10E6/uL (ref 3.77–5.28)
RDW: 11.6 % — AB (ref 12.3–15.4)
RPR Ser Ql: NONREACTIVE
WBC: 10.3 10*3/uL (ref 3.4–10.8)

## 2018-01-05 ENCOUNTER — Ambulatory Visit (INDEPENDENT_AMBULATORY_CARE_PROVIDER_SITE_OTHER): Payer: 59 | Admitting: Obstetrics and Gynecology

## 2018-01-05 VITALS — BP 130/68 | Wt 194.0 lb

## 2018-01-05 DIAGNOSIS — Z3A29 29 weeks gestation of pregnancy: Secondary | ICD-10-CM

## 2018-01-05 DIAGNOSIS — O0992 Supervision of high risk pregnancy, unspecified, second trimester: Secondary | ICD-10-CM

## 2018-01-05 DIAGNOSIS — O9989 Other specified diseases and conditions complicating pregnancy, childbirth and the puerperium: Secondary | ICD-10-CM

## 2018-01-05 DIAGNOSIS — R030 Elevated blood-pressure reading, without diagnosis of hypertension: Secondary | ICD-10-CM

## 2018-01-05 NOTE — Progress Notes (Signed)
Routine Prenatal Care Visit  Subjective  Tiffany SiresKimberly Conner is a 27 y.o. G1P0000 at 2181w4d being seen today for ongoing prenatal care.  She is currently monitored for the following issues for this high-risk pregnancy and has PSORIASIS; VSD; Pregnancy, supervision, high-risk, second trimester; and White coat syndrome without diagnosis of hypertension on their problem list.  ----------------------------------------------------------------------------------- Patient reports no complaints.   Contractions: Not present. Vag. Bleeding: None.  Movement: Present. Denies leaking of fluid.  ----------------------------------------------------------------------------------- The following portions of the patient's history were reviewed and updated as appropriate: allergies, current medications, past family history, past medical history, past social history, past surgical history and problem list. Problem list updated.   Objective  Blood pressure 130/68, weight 194 lb (88 kg), last menstrual period 06/12/2017. Pregravid weight 171 lb (77.6 kg) Total Weight Gain 23 lb (10.4 kg) Urinalysis:      Fetal Status: Fetal Heart Rate (bpm): 145 Fundal Height: 28 cm Movement: Present     General:  Alert, oriented and cooperative. Patient is in no acute distress.  Skin: Skin is warm and dry. No rash noted.   Cardiovascular: Normal heart rate noted  Respiratory: Normal respiratory effort, no problems with respiration noted  Abdomen: Soft, gravid, appropriate for gestational age. Pain/Pressure: Absent     Pelvic:  Cervical exam deferred        Extremities: Normal range of motion.     ental Status: Normal mood and affect. Normal behavior. Normal judgment and thought content.     Assessment   27 y.o. G1P0000 at 4881w4d by  03/19/2018, by Last Menstrual Period presenting for routine prenatal visit  Plan   FIRST Problems (from 08/06/17 to present)    Problem Noted Resolved   White coat syndrome without  diagnosis of hypertension 09/30/2017 by Vena AustriaStaebler, Shaunte Weissinger, MD No   Overview Signed 09/30/2017  9:28 PM by Vena AustriaStaebler, Kenshawn Maciolek, MD    [X]  Aspirin 81 mg daily after 12 weeks; discontinue after 36 weeks [X]  baseline labs with CBC, CMP, urine protein/creatinine ratio [ ]  no BP meds unless BPs become elevated [ ]  ultrasound for growth at 28, 32, 36 weeks    Current antihypertensives:  None   [facility] and surveillance [facility] refresh links as needed)  Lab Results  Component Value Date   PLT 270 08/06/2017   CREATININE 0.57 09/13/2017   AST 18 09/13/2017   ALT 15 09/13/2017    Antenatal Testing CHTN - O10.919  Group I  BP < 140/90, no preeclampsia, AGA,  nml AFV, +/- meds    Group II BP > 140/90, on meds, no preeclampsia, AGA, nml AFV  20-28-34-38  20-24-28-32-35-38  32//2 x wk  28//BPP wkly then 32//2 x wk  40 no meds; 39 meds  PRN or 37  Pre-eclampsia  GHTN - O13.9/Preeclampsia without severe features  - O14.00   Preeclampsia with severe features - O14.10  Q 3-4wks  Q 2 wks  28//BPP wkly then 32//2 x wk  Inpatient  37  PRN or 34        Pregnancy, supervision, high-risk, second trimester 08/06/2017 by Oswaldo ConroySchmid, Jacelyn Y, CNM No   Overview Addendum 01/05/2018  4:28 PM by Vena AustriaStaebler, Laurelle Skiver, MD    [facility] Prenatal Labs  Dating LMP = 9week US Blood type: A/Positive/-- (03/08 1446)   Genetic Screen 1 Screen: negative Antibody:Negative (03/08 1446)  Anatomic US Normal Female Rubella: 1.47 (03/08 1446) Varicella: Immune  GTT 90 RPR: Non Reactive (03/08 1446)   Rhogam  Not applicable HBsAg: Negative (03/08 1446)   TDaP vaccine [ ]  30 weeks                    HIV: Non Reactive (03/08 1446)   Baby Food                                GBS:   Contraception  Pap: 11/02/2016, NILM  CBB     CS/VBAC N/A   Support Person Adam (Husband)                 Gestational age appropriate obstetric precautions including but not limited to vaginal bleeding,  contractions, leaking of fluid and fetal movement were reviewed in detail with the patient.    Return in about 2 weeks (around 01/19/2018) for ROB and growth.  Vena Austria, MD, [facility], [facility] 01/05/2018, 4:28 PM

## 2018-01-05 NOTE — Progress Notes (Signed)
ROB

## 2018-01-19 ENCOUNTER — Ambulatory Visit (INDEPENDENT_AMBULATORY_CARE_PROVIDER_SITE_OTHER): Payer: 59

## 2018-01-19 ENCOUNTER — Other Ambulatory Visit: Payer: Self-pay | Admitting: Obstetrics and Gynecology

## 2018-01-19 ENCOUNTER — Ambulatory Visit (INDEPENDENT_AMBULATORY_CARE_PROVIDER_SITE_OTHER): Payer: 59 | Admitting: Obstetrics and Gynecology

## 2018-01-19 VITALS — BP 138/80 | Wt 199.0 lb

## 2018-01-19 DIAGNOSIS — Z23 Encounter for immunization: Secondary | ICD-10-CM | POA: Diagnosis not present

## 2018-01-19 DIAGNOSIS — Z3A31 31 weeks gestation of pregnancy: Secondary | ICD-10-CM

## 2018-01-19 DIAGNOSIS — O0992 Supervision of high risk pregnancy, unspecified, second trimester: Secondary | ICD-10-CM

## 2018-01-19 DIAGNOSIS — O9989 Other specified diseases and conditions complicating pregnancy, childbirth and the puerperium: Secondary | ICD-10-CM | POA: Diagnosis not present

## 2018-01-19 DIAGNOSIS — O365930 Maternal care for other known or suspected poor fetal growth, third trimester, not applicable or unspecified: Secondary | ICD-10-CM

## 2018-01-19 DIAGNOSIS — Z3A30 30 weeks gestation of pregnancy: Secondary | ICD-10-CM

## 2018-01-19 DIAGNOSIS — R030 Elevated blood-pressure reading, without diagnosis of hypertension: Secondary | ICD-10-CM

## 2018-01-25 ENCOUNTER — Encounter: Payer: Self-pay | Admitting: Obstetrics and Gynecology

## 2018-01-25 DIAGNOSIS — O365930 Maternal care for other known or suspected poor fetal growth, third trimester, not applicable or unspecified: Secondary | ICD-10-CM | POA: Insufficient documentation

## 2018-01-25 NOTE — Progress Notes (Signed)
Routine Prenatal Care Visit  Subjective  Tiffany SiresKimberly Conner is a 27 y.o. G1P0000 at 5745w3d being seen today for ongoing prenatal care.  She is currently monitored for the following issues for this high-risk pregnancy and has PSORIASIS; VSD; Pregnancy, supervision, high-risk, second trimester; and White coat syndrome without diagnosis of hypertension on their problem list.  ----------------------------------------------------------------------------------- Patient reports no complaints.   Contractions: Not present. Vag. Bleeding: None.  Movement: Present. Denies leaking of fluid.  ----------------------------------------------------------------------------------- The following portions of the patient's history were reviewed and updated as appropriate: allergies, current medications, past family history, past medical history, past social history, past surgical history and problem list. Problem list updated.   Objective  Blood pressure 138/80, weight 199 lb (90.3 kg), last menstrual period 06/12/2017. Pregravid weight 171 lb (77.6 kg) Total Weight Gain 28 lb (12.7 kg) Urinalysis:      Fetal Status: Fetal Heart Rate (bpm): 148 Fundal Height: 29 cm Movement: Present  Presentation: Vertex  General:  Alert, oriented and cooperative. Patient is in no acute distress.  Skin: Skin is warm and dry. No rash noted.   Cardiovascular: Normal heart rate noted  Respiratory: Normal respiratory effort, no problems with respiration noted  Abdomen: Soft, gravid, appropriate for gestational age. Pain/Pressure: Present     Pelvic:  Cervical exam deferred        Extremities: Normal range of motion.     ental Status: Normal mood and affect. Normal behavior. Normal judgment and thought content.     Assessment   27 y.o. G1P0000 at 8445w3d by  03/19/2018, by Last Menstrual Period presenting for routine prenatal visit  Plan   FIRST Problems (from 08/06/17 to present)    Problem Noted Resolved   White coat  syndrome without diagnosis of hypertension 09/30/2017 by Vena AustriaStaebler, Andreas, MD No   Overview Signed 09/30/2017  9:28 PM by Vena AustriaStaebler, Andreas, MD    [X]  Aspirin 81 mg daily after 12 weeks; discontinue after 36 weeks [X]  baseline labs with CBC, CMP, urine protein/creatinine ratio [ ]  no BP meds unless BPs become elevated [ ]  ultrasound for growth at 28, 32, 36 weeks    Current antihypertensives:  None   Baseline and surveillance labs ([facility], refresh links as needed)  Lab Results  Component Value Date   PLT 270 08/06/2017   CREATININE 0.57 09/13/2017   AST 18 09/13/2017   ALT 15 09/13/2017    Antenatal Testing CHTN - O10.919  Group I  BP < 140/90, no preeclampsia, AGA,  nml AFV, +/- meds    Group II BP > 140/90, on meds, no preeclampsia, AGA, nml AFV  20-28-34-38  20-24-28-32-35-38  32//2 x wk  28//BPP wkly then 32//2 x wk  40 no meds; 39 meds  PRN or 37  Pre-eclampsia  GHTN - O13.9/Preeclampsia without severe features  - O14.00   Preeclampsia with severe features - O14.10  Q 3-4wks  Q 2 wks  28//BPP wkly then 32//2 x wk  Inpatient  37  PRN or 34        Pregnancy, supervision, high-risk, second trimester 08/06/2017 by Oswaldo ConroySchmid, Jacelyn Y, CNM No   Overview Addendum 01/05/2018  4:28 PM by Vena AustriaStaebler, Andreas, MD    [facility] Prenatal Labs  Dating LMP = 9week US Blood type: A/Positive/-- (03/08 1446)   Genetic Screen 1 Screen: negative Antibody:Negative (03/08 1446)  Anatomic US Normal Female Rubella: 1.47 (03/08 1446) Varicella: Immune  GTT 90 RPR: Non Reactive (03/08 1446)   Rhogam  Not applicable HBsAg: Negative (03/08 1446)   TDaP vaccine [ ]  30 weeks                    HIV: Non Reactive (03/08 1446)   Baby Food                                GBS:   Contraception  Pap: 11/02/2016, NILM  CBB     CS/VBAC N/A   Support Person Adam (Husband)                 Gestational age appropriate obstetric precautions including but not limited to vaginal  bleeding, contractions, leaking of fluid and fetal movement were reviewed in detail with the patient.    Return in about 2 weeks (around 02/02/2018) for ROB.  Adelene Idler MD, [facility], [facility] 09/17/2020 3:26 PM

## 2018-01-26 ENCOUNTER — Encounter: Payer: 59 | Admitting: Advanced Practice Midwife

## 2018-01-27 ENCOUNTER — Ambulatory Visit (INDEPENDENT_AMBULATORY_CARE_PROVIDER_SITE_OTHER): Payer: 59

## 2018-01-27 ENCOUNTER — Encounter: Payer: Self-pay | Admitting: Obstetrics and Gynecology

## 2018-01-27 ENCOUNTER — Ambulatory Visit (INDEPENDENT_AMBULATORY_CARE_PROVIDER_SITE_OTHER): Payer: 59 | Admitting: Obstetrics and Gynecology

## 2018-01-27 ENCOUNTER — Other Ambulatory Visit: Payer: Self-pay | Admitting: Obstetrics and Gynecology

## 2018-01-27 VITALS — BP 130/68 | Wt 200.0 lb

## 2018-01-27 DIAGNOSIS — Z3A32 32 weeks gestation of pregnancy: Secondary | ICD-10-CM

## 2018-01-27 DIAGNOSIS — O9989 Other specified diseases and conditions complicating pregnancy, childbirth and the puerperium: Secondary | ICD-10-CM | POA: Diagnosis not present

## 2018-01-27 DIAGNOSIS — O365930 Maternal care for other known or suspected poor fetal growth, third trimester, not applicable or unspecified: Secondary | ICD-10-CM

## 2018-01-27 DIAGNOSIS — O0992 Supervision of high risk pregnancy, unspecified, second trimester: Secondary | ICD-10-CM

## 2018-01-27 DIAGNOSIS — R030 Elevated blood-pressure reading, without diagnosis of hypertension: Secondary | ICD-10-CM | POA: Diagnosis not present

## 2018-01-27 NOTE — Progress Notes (Signed)
ROB No concerns 

## 2018-01-28 ENCOUNTER — Other Ambulatory Visit: Payer: Self-pay | Admitting: Obstetrics and Gynecology

## 2018-01-28 DIAGNOSIS — Z3689 Encounter for other specified antenatal screening: Secondary | ICD-10-CM

## 2018-02-01 ENCOUNTER — Encounter: Payer: Self-pay | Admitting: Obstetrics and Gynecology

## 2018-02-01 ENCOUNTER — Ambulatory Visit (INDEPENDENT_AMBULATORY_CARE_PROVIDER_SITE_OTHER): Payer: 59 | Admitting: Obstetrics and Gynecology

## 2018-02-01 ENCOUNTER — Other Ambulatory Visit (INDEPENDENT_AMBULATORY_CARE_PROVIDER_SITE_OTHER): Payer: 59

## 2018-02-01 VITALS — BP 138/72 | Wt 202.0 lb

## 2018-02-01 DIAGNOSIS — O9989 Other specified diseases and conditions complicating pregnancy, childbirth and the puerperium: Secondary | ICD-10-CM

## 2018-02-01 DIAGNOSIS — O0992 Supervision of high risk pregnancy, unspecified, second trimester: Secondary | ICD-10-CM

## 2018-02-01 DIAGNOSIS — Z3A32 32 weeks gestation of pregnancy: Secondary | ICD-10-CM | POA: Diagnosis not present

## 2018-02-01 DIAGNOSIS — O365930 Maternal care for other known or suspected poor fetal growth, third trimester, not applicable or unspecified: Secondary | ICD-10-CM | POA: Diagnosis not present

## 2018-02-01 DIAGNOSIS — Z3A33 33 weeks gestation of pregnancy: Secondary | ICD-10-CM

## 2018-02-01 DIAGNOSIS — R030 Elevated blood-pressure reading, without diagnosis of hypertension: Secondary | ICD-10-CM

## 2018-02-01 LAB — POCT URINALYSIS DIPSTICK OB
Glucose, UA: NEGATIVE
POC,PROTEIN,UA: NEGATIVE

## 2018-02-01 LAB — FETAL NONSTRESS TEST

## 2018-02-01 NOTE — Progress Notes (Signed)
Routine Prenatal Care Visit  Subjective  Tiffany Conner is a 27 y.o. G1P0000 at [redacted]w[redacted]d being seen today for ongoing prenatal care.  She is currently monitored for the following issues for this high-risk pregnancy and has PSORIASIS; VSD; Pregnancy, supervision, high-risk, second trimester; White coat syndrome without diagnosis of hypertension; and Intrauterine growth restriction (IUGR) affecting care of mother, third trimester, single gestation on their problem list.  ----------------------------------------------------------------------------------- Patient reports no complaints.   Contractions: Not present. Vag. Bleeding: None.  Movement: Present. Denies leaking of fluid.  ----------------------------------------------------------------------------------- The following portions of the patient's history were reviewed and updated as appropriate: allergies, current medications, past family history, past medical history, past social history, past surgical history and problem list. Problem list updated.   Objective  Blood pressure 138/72, weight 202 lb (91.6 kg), last menstrual period 06/12/2017. Pregravid weight 171 lb (77.6 kg) Total Weight Gain 31 lb (14.1 kg) Urinalysis:      Fetal Status: Fetal Heart Rate (bpm): 140   Movement: Present  Presentation: Vertex  General:  Alert, oriented and cooperative. Patient is in no acute distress.  Skin: Skin is warm and dry. No rash noted.   Cardiovascular: Normal heart rate noted  Respiratory: Normal respiratory effort, no problems with respiration noted  Abdomen: Soft, gravid, appropriate for gestational age. Pain/Pressure: Absent     Pelvic:  Cervical exam deferred        Extremities: Normal range of motion.     ental Status: Normal mood and affect. Normal behavior. Normal judgment and thought content.   Baseline: 140 Variability: moderate Accelerations: present Decelerations: absent Tocometry: N/A The patient was monitored for 30  minutes, fetal heart rate tracing was deemed reactive, category I tracing,  US Ob Follow Up  Result Date: 01/20/2018 ULTRASOUND REPORT Patient Name: Tiffany Conner DOB: 1991-04-25 MRN: [id number] Location: [facility] Date of Service: 01/19/2018 Indications:growth/afi Findings: Mason Jim intrauterine pregnancy is visualized with FHR at 158 BPM. Biometrics give an (U/S) Gestational age of 101w1d and an (U/S) EDD of 03/29/2018; this correlates with the clinically established Estimated Date of Delivery: 03/19/18. Fetal presentation is Cephalic. Placenta: Posterior, grade 0. AFI: 12.93 cm Growth percentile is 20th overall with AC< 5th and FL<2nd. EFW: 3lbs 2oz/ 1415g Umbilical Artery Dopplers were performed due to fetal growth restriction. Systolic and Diastolic blood flow in each umbilical artery appear normal and without reversal or absence of diastolic flow. The maximum S/D ratio is 3.32. According to perinatology.com, this ratio is normal for this gestational age. Impression: 1. [redacted]w[redacted]d Viable Singleton Intrauterine pregnancy previously established criteria. 2. Growth is 20th overall with AC< 5th and FL<2nd. %ile.  AFI is 12.93 cm. 3. Normal Umbilical Artery Dopplers Recommendations: 1.Clinical correlation with the patient's History and Physical Exam. Willette Alma, RDMS, RVT Review of ULTRASOUND.    I have personally reviewed images and report of recent ultrasound done at [facility].    Plan of management to be discussed with patient. Annamarie Major, MD, [facility], [facility] 01/20/2018  10:28 AM   [place] Ua Cord Doppler  Result Date: 01/30/2018 ULTRASOUND REPORT Patient Name: Tiffany Conner DOB: [birth year] MRN: [id number] Location: [facility] Date of Service: 01/27/2018 Indications: Biophysical Profile performed for indication of IUGR Findings: Singleton intrauterine pregnancy is visualized with FHR at 161 bpm.   Fetal presentation is Cephalic. Placenta: Anterior, grade 0. AFI: 14.  cm BPP Scoring: Movement: 2/2 Tone: 2/2 Breathing: 2/2 AFI: 2/2 Umbilical Artery Dopplers were performed due to fetal growth restriction Systolic and  Diastolic blood flow in each umbilical artery appear normal and without reversal or absence of diastolic flow. The maximum S/D ratio is 2.73. According to perinatology.com, this ratio is normal for this gestational age. Impression: 1. [redacted]w[redacted]d Viable Singleton Intrauterine pregnancy dated by previously established criteria. 2. AFI is 14. cm. 3. BPP is 8/8 4. UA Dopplers are normal Recommendations: 1.Clinical correlation with the patient's History and Physical Exam. Willette Alma, RDMS, RVT I have reviewed this ultrasound and the report. I agree with the above assessment and plan. Adelene Idler MD [facility] 01/30/18 4:37 PM      Assessment   27 y.o. G1P0000 at [redacted]w[redacted]d by  03/19/2018, by Last Menstrual Period presenting for routine prenatal visit  Plan   FIRST Problems (from 08/06/17 to present)    Problem Noted Resolved   Intrauterine growth restriction (IUGR) affecting care of mother, third trimester, single gestation 01/25/2018 by Natale Milch, MD No   Overview Signed 01/25/2018 10:33 AM by Natale Milch, MD    Growth [place] every 3-4 weeks  [ ]  34  [ ] 38  Weekly UA dopplers and Fluid Twice weekly NSTs after 32 weeks      White coat syndrome without diagnosis of hypertension 09/30/2017 by Vena Austria, MD No   Overview Signed 09/30/2017  9:28 PM by Vena Austria, MD    [X]  Aspirin 81 mg daily after 12 weeks; discontinue after 36 weeks [X]  baseline labs with CBC, CMP, urine protein/creatinine ratio [ ]  no BP meds unless BPs become elevated [ ]  ultrasound for growth at 28, 32, 36 weeks    Current antihypertensives:  None   Baseline and surveillance labs (pulled in from [facility], refresh links as needed)  Lab Results  Component Value Date   PLT 270 08/06/2017   CREATININE 0.57 09/13/2017    AST 18 09/13/2017   ALT 15 09/13/2017    Antenatal Testing CHTN - O10.919  Group I  BP < 140/90, no preeclampsia, AGA,  nml AFV, +/- meds    Group II BP > 140/90, on meds, no preeclampsia, AGA, nml AFV  20-28-34-38  20-24-28-32-35-38  32//2 x wk  28//BPP wkly then 32//2 x wk  40 no meds; 39 meds  PRN or 37  Pre-eclampsia  GHTN - O13.9/Preeclampsia without severe features  - O14.00   Preeclampsia with severe features - O14.10  Q 3-4wks  Q 2 wks  28//BPP wkly then 32//2 x wk  Inpatient  37  PRN or 34        Pregnancy, supervision, high-risk, second trimester 08/06/2017 by Oswaldo Conroy, CNM No   Overview Addendum 01/05/2018  4:28 PM by Vena Austria, MD    Clinic [facility] Prenatal Labs  Dating LMP = 9week [place] Blood type: A/Positive/-- (03/08 1446)   Genetic Screen 1 Screen: negative Antibody:Negative (03/08 1446)  Anatomic [place] Normal Female Rubella: 1.47 (03/08 1446) Varicella: Immune  GTT 90 RPR: Non Reactive (03/08 1446)   Rhogam Not applicable HBsAg: Negative (03/08 1446)   TDaP vaccine [ ]  30 weeks                    HIV: Non Reactive (03/08 1446)   Baby Food                                GBS:   Contraception  Pap: 11/02/2016, NILM  CBB     CS/VBAC N/A  Support Person Madelaine Bhat (Husband)                 Gestational age appropriate obstetric precautions including but not limited to vaginal bleeding, contractions, leaking of fluid and fetal movement were reviewed in detail with the patient.    - Overall growth is appropriate for gestational age.  The definition by SMFM and ACOG of intrauterine growth restriction remains based on composite weight <10%ile. There have been some studies examining isolated AC as a marker for fetal growth restriction.  These studies have not correlated well at identifying fetal growth restriction based on observed birth weights.  Furthermore studies have failed to consistently demonstrate adverse fetal outcomes for fetuses with isolated  AC below the 10%ile or even 5%ile in the absence of other factors, and normal composite growth.    ACOG practice bulletin 204 "Fetal growth restriction" February 2019   "Is Abdominal Circumference Associated with Poor Outcomes in Fetuses with Estimated Fetal Weights between 10-20%?" Leonette Most all Obstetrics & Genecology May 2017 Volume 129 Issue 5, p S168   Return in about 13 days (around 02/14/2018) for ROB/NST/Doppler (4:30 PM can cancel any other prior appointments).  Vena Austria, MD, Evern Core [facility], [facility] 02/01/2018, 9:13 PM

## 2018-02-01 NOTE — Patient Instructions (Signed)
Overall growth is appropriate for gestational age.  The definition by SMFM and ACOG of intrauterine growth restriction remains based on composite weight <10%ile. There have been some studies examining isolated AC as a marker for fetal growth restriction.  These studies have not correlated well at identifying fetal growth restriction based on observed birth weights.  Furthermore studies have failed to consistently demonstrate adverse fetal outcomes for fetuses with isolated AC below the 10%ile or even 5%ile in the absence of other factors, and normal composite growth.    ACOG practice bulletin 204 "Fetal growth restriction" February 2019   "Is Abdominal Circumference Associated with Poor Outcomes in Fetuses with Estimated Fetal Weights between 10-20%?" Leonette Most all Obstetrics & Genecology May 2017 Volume 129 Issue 5, p S168

## 2018-02-01 NOTE — Progress Notes (Signed)
ROB AFI/NST 

## 2018-02-03 ENCOUNTER — Other Ambulatory Visit: Payer: Self-pay | Admitting: Obstetrics and Gynecology

## 2018-02-03 DIAGNOSIS — O365931 Maternal care for other known or suspected poor fetal growth, third trimester, fetus 1: Secondary | ICD-10-CM

## 2018-02-04 ENCOUNTER — Encounter: Payer: 59 | Admitting: Obstetrics and Gynecology

## 2018-02-04 ENCOUNTER — Other Ambulatory Visit: Payer: 59

## 2018-02-07 ENCOUNTER — Ambulatory Visit
Admission: RE | Admit: 2018-02-07 | Discharge: 2018-02-07 | Disposition: A | Discharge status: Home / Self Care | Payer: 59 | Source: Ambulatory Visit | Attending: Obstetrics and Gynecology | Admitting: Obstetrics and Gynecology

## 2018-02-07 ENCOUNTER — Other Ambulatory Visit: Payer: 59

## 2018-02-07 ENCOUNTER — Ambulatory Visit: Payer: 59

## 2018-02-07 ENCOUNTER — Other Ambulatory Visit: Payer: Self-pay | Admitting: Obstetrics and Gynecology

## 2018-02-07 DIAGNOSIS — Z3689 Encounter for other specified antenatal screening: Secondary | ICD-10-CM

## 2018-02-07 DIAGNOSIS — O365930 Maternal care for other known or suspected poor fetal growth, third trimester, not applicable or unspecified: Secondary | ICD-10-CM

## 2018-02-07 DIAGNOSIS — Q210 Ventricular septal defect: Secondary | ICD-10-CM | POA: Diagnosis not present

## 2018-02-07 DIAGNOSIS — Z3A34 34 weeks gestation of pregnancy: Secondary | ICD-10-CM | POA: Insufficient documentation

## 2018-02-07 DIAGNOSIS — O99413 Diseases of the circulatory system complicating pregnancy, third trimester: Secondary | ICD-10-CM | POA: Diagnosis not present

## 2018-02-07 DIAGNOSIS — R030 Elevated blood-pressure reading, without diagnosis of hypertension: Secondary | ICD-10-CM

## 2018-02-07 DIAGNOSIS — O09899 Supervision of other high risk pregnancies, unspecified trimester: Secondary | ICD-10-CM

## 2018-02-07 DIAGNOSIS — O0992 Supervision of high risk pregnancy, unspecified, second trimester: Secondary | ICD-10-CM

## 2018-02-07 NOTE — Progress Notes (Signed)
NST normal variability  No accels  Non reactiv e BPP 8/10  Jimmey Ralph

## 2018-02-07 NOTE — Progress Notes (Addendum)
[facility] Consultation   Chief Complaint: FGR   HPI: Tiffany Conner is a 27 y.o. G1P0000 at [redacted]w[redacted]d  who presents in consultation from [facility] for FGR - Pt has a h/o VSD unrepaired -pt had been seen for a fetal echo at [facility] on 12/22/17 which appeared normal. Today , we only imaged a single umbilical artery on bladder imaging and cross section of the cord   Past Medical History: Patient  has a past medical history of Heart murmur and VSD (ventricular septal defect).  Past Surgical History: She  has a past surgical history that includes Myringotomy and Tibia fracture surgery.  Obstetric History:  OB History    Gravida  1   Para  0   Term  0   Preterm  0   AB  0   Living  0     SAB  0   TAB  0   Ectopic  0   Multiple  0   Live Births  0          Gynecologic History:  Patient's last menstrual period was 06/12/2017.    Current Outpatient Medications:  .  Clobetasol Propionate Emulsion 0.05 % topical foam, , Disp: , Rfl:  .  Prenatal Vit-Fe Fumarate-FA (PRENATAL MULTIVITAMIN) TABS tablet, Take 1 tablet by mouth daily at 12 noon., Disp: , Rfl:  Allergies: Patient has No Known Allergies.  Social History: Patient  reports that she has never smoked. She has never used smokeless tobacco. She reports that she does not drink alcohol or use drugs.  Family History: family history includes Ovarian cancer in her other.  Review of Systems A full 12 point review of systems was negative or as noted in the History of Present Illness.  Physical Exam: LMP 06/12/2017  U/s only  Asessement: 1. Intrauterine growth restriction (IUGR) affecting care of mother, third trimester, single gestation   2. White coat syndrome without diagnosis of hypertension   3. Pregnancy, supervision, high-risk, second trimester   4. Single umbilical artery affecting management of mother in singleton pregnancy, antepartum   5.   Plan: Continue twice weekly monitoring nSTs I spoke  with Dr Jerene Pitch  Weekly dopplers AFI  Growth at 36 + if > 5th consider iOL at 39 if <5th then IOL at 37 weeks  Filters on IV for h/o maternal VSD. Pt reports she has a cardiologist    Total time spent with the patient was 30 minutes with greater than 50% spent in counseling and coordination of care. We appreciate this interesting consult and will be happy to be involved in the ongoing care of Tiffany Conner in anyway her obstetricians desire. Jimmey Ralph MD Maternal-Fetal Medicine [facility]

## 2018-02-08 ENCOUNTER — Ambulatory Visit (INDEPENDENT_AMBULATORY_CARE_PROVIDER_SITE_OTHER): Payer: 59 | Admitting: Obstetrics and Gynecology

## 2018-02-08 ENCOUNTER — Other Ambulatory Visit: Payer: 59

## 2018-02-08 ENCOUNTER — Encounter: Payer: Self-pay | Admitting: Obstetrics and Gynecology

## 2018-02-08 VITALS — BP 134/78 | Wt 202.0 lb

## 2018-02-08 DIAGNOSIS — O09899 Supervision of other high risk pregnancies, unspecified trimester: Secondary | ICD-10-CM

## 2018-02-08 DIAGNOSIS — Z3A34 34 weeks gestation of pregnancy: Secondary | ICD-10-CM

## 2018-02-08 DIAGNOSIS — O365930 Maternal care for other known or suspected poor fetal growth, third trimester, not applicable or unspecified: Secondary | ICD-10-CM

## 2018-02-08 DIAGNOSIS — O0993 Supervision of high risk pregnancy, unspecified, third trimester: Secondary | ICD-10-CM

## 2018-02-08 NOTE — Progress Notes (Signed)
Routine Prenatal Care Visit  Subjective  Tiffany Conner is a 27 y.o. G1P0000 at [redacted]w[redacted]d being seen today for ongoing prenatal care.  She is currently monitored for the following issues for this high-risk pregnancy and has PSORIASIS; VSD; Supervision of high risk pregnancy, antepartum, third trimester; White coat syndrome without diagnosis of hypertension; Intrauterine growth restriction (IUGR) affecting care of mother, third trimester, single gestation; and Single umbilical artery affecting management of mother in singleton pregnancy, antepartum on their problem list.  ----------------------------------------------------------------------------------- Patient reports no complaints.   Contractions: Not present. Vag. Bleeding: None.  Movement: Present. Denies leaking of fluid.  Consult with [facility] yesterday.  Growth u/s showing growth 4th%ile, AFI 12.9 cm. BPP 8/10 (non-reactive NST).   ----------------------------------------------------------------------------------- The following portions of the patient's history were reviewed and updated as appropriate: allergies, current medications, past family history, past medical history, past social history, past surgical history and problem list. Problem list updated.   Objective  Blood pressure 134/78, weight 202 lb (91.6 kg), last menstrual period 06/12/2017. Pregravid weight 171 lb (77.6 kg) Total Weight Gain 31 lb (14.1 kg) Urinalysis: Urine Protein    Urine Glucose    Fetal Status: Fetal Heart Rate (bpm): 140   Movement: Present     General:  Alert, oriented and cooperative. Patient is in no acute distress.  Skin: Skin is warm and dry. No rash noted.   Cardiovascular: Normal heart rate noted  Respiratory: Normal respiratory effort, no problems with respiration noted  Abdomen: Soft, gravid, appropriate for gestational age. Pain/Pressure: Present     Pelvic:  Cervical exam deferred        Extremities: Normal range of motion.  Edema: Trace   Mental Status: Normal mood and affect. Normal behavior. Normal judgment and thought content.   Assessment   27 y.o. G1P0000 at [redacted]w[redacted]d by  03/19/2018, by Last Menstrual Period presenting for routine prenatal visit  Plan   FIRST Problems (from 08/06/17 to present)    Problem Noted Resolved   Intrauterine growth restriction (IUGR) affecting care of mother, third trimester, single gestation 01/25/2018 by Natale Milch, MD No   Overview Signed 01/25/2018 10:33 AM by Natale Milch, MD    Growth [place] every 3-4 weeks  [ ]  34  [ ] 38  Weekly UA dopplers and Fluid Twice weekly NSTs after 32 weeks      White coat syndrome without diagnosis of hypertension 09/30/2017 by Vena Austria, MD No   Overview Signed 09/30/2017  9:28 PM by Vena Austria, MD    [X]  Aspirin 81 mg daily after 12 weeks; discontinue after 36 weeks [X]  baseline labs with CBC, CMP, urine protein/creatinine ratio [ ]  no BP meds unless BPs become elevated [ ]  ultrasound for growth at 28, 32, 36 weeks    Current antihypertensives:  None   Baseline and surveillance labs (pulled in from [facility], refresh links as needed)  Lab Results  Component Value Date   PLT 270 08/06/2017   CREATININE 0.57 09/13/2017   AST 18 09/13/2017   ALT 15 09/13/2017    Antenatal Testing CHTN - O10.919  Group I  BP < 140/90, no preeclampsia, AGA,  nml AFV, +/- meds    Group II BP > 140/90, on meds, no preeclampsia, AGA, nml AFV  20-28-34-38  20-24-28-32-35-38  32//2 x wk  28//BPP wkly then 32//2 x wk  40 no meds; 39 meds  PRN or 37  Pre-eclampsia  GHTN - O13.9/Preeclampsia without severe features  - O14.00   Preeclampsia  with severe features - O14.10  Q 3-4wks  Q 2 wks  28//BPP wkly then 32//2 x wk  Inpatient  37  PRN or 34        Supervision of high risk pregnancy, antepartum, third trimester 08/06/2017 by Oswaldo Conroy, CNM No   Overview Addendum 01/05/2018  4:28 PM by Vena Austria, MD    Clinic  [facility] Prenatal Labs  Dating LMP = 9week [place] Blood type: A/Positive/-- (03/08 1446)   Genetic Screen 1 Screen: negative Antibody:Negative (03/08 1446)  Anatomic [place] Normal Female Rubella: 1.47 (03/08 1446) Varicella: Immune  GTT 90 RPR: Non Reactive (03/08 1446)   Rhogam Not applicable HBsAg: Negative (03/08 1446)   TDaP vaccine [ ]  30 weeks                    HIV: Non Reactive (03/08 1446)   Baby Food                                GBS:   Contraception  Pap: 11/02/2016, NILM  CBB     CS/VBAC N/A   Support Person Adam (Husband)                 Preterm labor symptoms and general obstetric precautions including but not limited to vaginal bleeding, contractions, leaking of fluid and fetal movement were reviewed in detail with the patient. Please refer to After Visit Summary for other counseling recommendations.   - Reiterated findings from [facility].  She will have weekly AFI with uterine artery dopplers with NST and growth as deemed appropriate by [facility]. She will have NSTs weekly here until her indicated delivery time.  Given the growth was 4th %ile and [facility] recommends delivery at 37 weeks for growth < 5th %ile, IOL likely at 37 weeks. However, [facility] may opt to rescan her at 37 weeks to verify the fetus is still at <5th%ile and then make decision for delivery timing. Will continue to monitor. Fetal kick counts stressed to patient.   Return in about 6 days (around 02/14/2018) for routine prenatal/NST weekly on Monday afternoons (keep 9/12 appt) (o/w cancel all other appts).  Thomasene Mohair, MD, [facility], [facility] 02/10/2018 10:40 AM

## 2018-02-10 ENCOUNTER — Encounter: Payer: Self-pay | Admitting: Obstetrics and Gynecology

## 2018-02-10 ENCOUNTER — Encounter: Payer: Self-pay | Admitting: Maternal Newborn

## 2018-02-10 ENCOUNTER — Ambulatory Visit (INDEPENDENT_AMBULATORY_CARE_PROVIDER_SITE_OTHER): Payer: 59 | Admitting: Maternal Newborn

## 2018-02-10 VITALS — BP 140/50 | Wt 204.0 lb

## 2018-02-10 DIAGNOSIS — O365930 Maternal care for other known or suspected poor fetal growth, third trimester, not applicable or unspecified: Secondary | ICD-10-CM | POA: Diagnosis not present

## 2018-02-10 DIAGNOSIS — Z3A34 34 weeks gestation of pregnancy: Secondary | ICD-10-CM | POA: Diagnosis not present

## 2018-02-10 DIAGNOSIS — O0993 Supervision of high risk pregnancy, unspecified, third trimester: Secondary | ICD-10-CM

## 2018-02-10 LAB — POCT URINALYSIS DIPSTICK OB: Glucose, UA: NEGATIVE

## 2018-02-10 LAB — FETAL NONSTRESS TEST

## 2018-02-10 NOTE — Progress Notes (Signed)
NST/ROB-no concerns

## 2018-02-10 NOTE — Progress Notes (Signed)
Routine Prenatal Care Visit  Subjective  Tiffany Conner is a 27 y.o. G1P0000 at 6932w5d being seen today for ongoing prenatal care.  She is currently monitored for the following issues for this high-risk pregnancy and has PSORIASIS; VSD; Supervision of high risk pregnancy, antepartum, third trimester; White coat syndrome without diagnosis of hypertension; Intrauterine growth restriction (IUGR) affecting care of mother, third trimester, single gestation; and Single umbilical artery affecting management of mother in singleton pregnancy, antepartum on their problem list.  ----------------------------------------------------------------------------------- Patient reports no complaints.   Contractions: Not present. Vag. Bleeding: None.  Movement: Present. No leaking of fluid.  ----------------------------------------------------------------------------------- The following portions of the patient's history were reviewed and updated as appropriate: allergies, current medications, past family history, past medical history, past social history, past surgical history and problem list. Problem list updated.  Objective  Blood pressure (!) 140/50, weight 204 lb (92.5 kg), last menstrual period 06/12/2017. Pregravid weight 171 lb (77.6 kg) Total Weight Gain 33 lb (15 kg) Body mass index is 32.93 kg/m. Urinalysis: Protein Trace, Glucose Negative Fetal Status: Fetal Heart Rate (bpm): 145 Fundal Height: 33 cm Movement: Present     General:  Alert, oriented and cooperative. Patient is in no acute distress.  Skin: Skin is warm and dry. No rash noted.   Cardiovascular: Normal heart rate noted  Respiratory: Normal respiratory effort, no problems with respiration noted  Abdomen: Soft, gravid, appropriate for gestational age. Pain/Pressure: Absent     Pelvic:  Cervical exam deferred        Extremities: Normal range of motion.  Edema: Trace  Mental Status: Normal mood and affect. Normal behavior. Normal  judgment and thought content.   NST Baseline: 145 Variability: moderate Accelerations: present Decelerations: absent Tocometry: not done The patient was monitored for 20 minutes, fetal heart rate tracing was deemed reactive.   Assessment   27 y.o. G1P0000 at 3332w5d, EDD 03/19/2018 by Last Menstrual Period presenting for a routine prenatal visit.  Plan   FIRST Problems (from 08/06/17 to present)    Problem Noted Resolved   Intrauterine growth restriction (IUGR) affecting care of mother, third trimester, single gestation 01/25/2018 by Natale MilchSchuman, Christanna R, MD No   Overview Signed 01/25/2018 10:33 AM by Natale MilchSchuman, Christanna R, MD    Growth US every 3-4 weeks  [ ]  34  [ ] 38  Weekly UA dopplers and Fluid Twice weekly NSTs after 32 weeks      White coat syndrome without diagnosis of hypertension 09/30/2017 by Vena AustriaStaebler, Andreas, MD No   Overview Signed 09/30/2017  9:28 PM by Vena AustriaStaebler, Andreas, MD    [X]  Aspirin 81 mg daily after 12 weeks; discontinue after 36 weeks [X]  baseline labs with CBC, CMP, urine protein/creatinine ratio [ ]  no BP meds unless BPs become elevated [ ]  ultrasound for growth at 28, 32, 36 weeks    Current antihypertensives:  None   Baseline and surveillance [facility], refresh links as needed)  Lab Results  Component Value Date   PLT 270 08/06/2017   CREATININE 0.57 09/13/2017   AST 18 09/13/2017   ALT 15 09/13/2017    Antenatal Testing CHTN - O10.919  Group I  BP < 140/90, no preeclampsia, AGA,  nml AFV, +/- meds    Group II BP > 140/90, on meds, no preeclampsia, AGA, nml AFV  20-28-34-38  20-24-28-32-35-38  32//2 x wk  28//BPP wkly then 32//2 x wk  40 no meds; 39 meds  PRN or 37  Pre-eclampsia  GHTN - O13.9/Preeclampsia without severe features  - O14.00   Preeclampsia with severe features - O14.10  Q 3-4wks  Q 2 wks  28//BPP wkly then 32//2 x wk  Inpatient  37  PRN or 34        Supervision of high risk pregnancy,  antepartum, third trimester 08/06/2017 by Oswaldo Conroy, CNM No   Overview Addendum 02/10/2018  3:36 PM by Oswaldo Conroy, CNM    Clinic [facility] Prenatal Labs  Dating LMP = 9week [place] Blood type: A/Positive/-- (03/08 1446)   Genetic Screen 1 Screen: negative Antibody:Negative (03/08 1446)  Anatomic [place] Normal Female Rubella: 1.47 (03/08 1446) Varicella: Immune  GTT 90 RPR: Non Reactive (03/08 1446)   Rhogam Not applicable HBsAg: Negative (03/08 1446)   TDaP vaccine 01/19/2018                    HIV: Non Reactive (03/08 1446)   Baby Food                                GBS:   Contraception  Pap: 11/02/2016, NILM  CBB     CS/VBAC N/A   Support Person Adam (Husband)              NST reactive.   Please refer to After Visit Summary for other counseling recommendations.   Continue weekly NST at [facility] and APT at [facility].  Keep scheduled visit for ROB and NST 02/14/18.  Marcelyn Bruins, CNM 02/10/2018

## 2018-02-11 NOTE — Patient Instructions (Signed)
Third Trimester of Pregnancy The third trimester is from week 28 through week 40 (months 7 through 9). The third trimester is a time when the unborn baby (fetus) is growing rapidly. At the end of the ninth month, the fetus is about 20 inches in length and weighs 6-10 pounds. Body changes during your third trimester Your body will continue to go through many changes during pregnancy. The changes vary from woman to woman. During the third trimester:  Your weight will continue to increase. You can expect to gain 25-35 pounds (11-16 kg) by the end of the pregnancy.  You may begin to get stretch marks on your hips, abdomen, and breasts.  You may urinate more often because the fetus is moving lower into your pelvis and pressing on your bladder.  You may develop or continue to have heartburn. This is caused by increased hormones that slow down muscles in the digestive tract.  You may develop or continue to have constipation because increased hormones slow digestion and cause the muscles that push waste through your intestines to relax.  You may develop hemorrhoids. These are swollen veins (varicose veins) in the rectum that can itch or be painful.  You may develop swollen, bulging veins (varicose veins) in your legs.  You may have increased body aches in the pelvis, back, or thighs. This is due to weight gain and increased hormones that are relaxing your joints.  You may have changes in your hair. These can include thickening of your hair, rapid growth, and changes in texture. Some women also have hair loss during or after pregnancy, or hair that feels dry or thin. Your hair will most likely return to normal after your baby is born.  Your breasts will continue to grow and they will continue to become tender. A yellow fluid (colostrum) may leak from your breasts. This is the first milk you are producing for your baby.  Your belly button may stick out.  You may notice more swelling in your hands,  face, or ankles.  You may have increased tingling or numbness in your hands, arms, and legs. The skin on your belly may also feel numb.  You may feel short of breath because of your expanding uterus.  You may have more problems sleeping. This can be caused by the size of your belly, increased need to urinate, and an increase in your body's metabolism.  You may notice the fetus "dropping," or moving lower in your abdomen (lightening).  You may have increased vaginal discharge.  You may notice your joints feel loose and you may have pain around your pelvic bone.  What to expect at prenatal visits You will have prenatal exams every 2 weeks until week 36. Then you will have weekly prenatal exams. During a routine prenatal visit:  You will be weighed to make sure you and the baby are growing normally.  Your blood pressure will be taken.  Your abdomen will be measured to track your baby's growth.  The fetal heartbeat will be listened to.  Any test results from the previous visit will be discussed.  You may have a cervical check near your due date to see if your cervix has softened or thinned (effaced).  You will be tested for Group B streptococcus. This happens between 35 and 37 weeks.  Your health care provider may ask you:  What your birth plan is.  How you are feeling.  If you are feeling the baby move.  If you have had   any abnormal symptoms, such as leaking fluid, bleeding, severe headaches, or abdominal cramping.  If you are using any tobacco products, including cigarettes, chewing tobacco, and electronic cigarettes.  If you have any questions.  Other tests or screenings that may be performed during your third trimester include:  Blood tests that check for low iron levels (anemia).  Fetal testing to check the health, activity level, and growth of the fetus. Testing is done if you have certain medical conditions or if there are problems during the  pregnancy.  Nonstress test (NST). This test checks the health of your baby to make sure there are no signs of problems, such as the baby not getting enough oxygen. During this test, a belt is placed around your belly. The baby is made to move, and its heart rate is monitored during movement.  What is false labor? False labor is a condition in which you feel small, irregular tightenings of the muscles in the womb (contractions) that usually go away with rest, changing position, or drinking water. These are called Braxton Hicks contractions. Contractions may last for hours, days, or even weeks before true labor sets in. If contractions come at regular intervals, become more frequent, increase in intensity, or become painful, you should see your health care provider. What are the signs of labor?  Abdominal cramps.  Regular contractions that start at 10 minutes apart and become stronger and more frequent with time.  Contractions that start on the top of the uterus and spread down to the lower abdomen and back.  Increased pelvic pressure and dull back pain.  A watery or bloody mucus discharge that comes from the vagina.  Leaking of amniotic fluid. This is also known as your "water breaking." It could be a slow trickle or a gush. Let your health care provider know if it has a color or strange odor. If you have any of these signs, call your health care provider right away, even if it is before your due date. Follow these instructions at home: Medicines  Follow your health care provider's instructions regarding medicine use. Specific medicines may be either safe or unsafe to take during pregnancy.  Take a prenatal vitamin that contains at least 600 micrograms (mcg) of folic acid.  If you develop constipation, try taking a stool softener if your health care provider approves. Eating and drinking  Eat a balanced diet that includes fresh fruits and vegetables, whole grains, good sources of protein  such as meat, eggs, or tofu, and low-fat dairy. Your health care provider will help you determine the amount of weight gain that is right for you.  Avoid raw meat and uncooked cheese. These carry germs that can cause birth defects in the baby.  If you have low calcium intake from food, talk to your health care provider about whether you should take a daily calcium supplement.  Eat four or five small meals rather than three large meals a day.  Limit foods that are high in fat and processed sugars, such as fried and sweet foods.  To prevent constipation: ? Drink enough fluid to keep your urine clear or pale yellow. ? Eat foods that are high in fiber, such as fresh fruits and vegetables, whole grains, and beans. Activity  Exercise only as directed by your health care provider. Most women can continue their usual exercise routine during pregnancy. Try to exercise for 30 minutes at least 5 days a week. Stop exercising if you experience uterine contractions.  Avoid heavy   lifting.  Do not exercise in extreme heat or humidity, or at high altitudes.  Wear low-heel, comfortable shoes.  Practice good posture.  You may continue to have sex unless your health care provider tells you otherwise. Relieving pain and discomfort  Take frequent breaks and rest with your legs elevated if you have leg cramps or low back pain.  Take warm sitz baths to soothe any pain or discomfort caused by hemorrhoids. Use hemorrhoid cream if your health care provider approves.  Wear a good support bra to prevent discomfort from breast tenderness.  If you develop varicose veins: ? Wear support pantyhose or compression stockings as told by your healthcare provider. ? Elevate your feet for 15 minutes, 3-4 times a day. Prenatal care  Write down your questions. Take them to your prenatal visits.  Keep all your prenatal visits as told by your health care provider. This is important. Safety  Wear your seat belt at  all times when driving.  Make a list of emergency phone numbers, including numbers for family, friends, the hospital, and police and fire departments. General instructions  Avoid cat litter boxes and soil used by cats. These carry germs that can cause birth defects in the baby. If you have a cat, ask someone to clean the litter box for you.  Do not travel far distances unless it is absolutely necessary and only with the approval of your health care provider.  Do not use hot tubs, steam rooms, or saunas.  Do not drink alcohol.  Do not use any products that contain nicotine or tobacco, such as cigarettes and e-cigarettes. If you need help quitting, ask your health care provider.  Do not use any medicinal herbs or unprescribed drugs. These chemicals affect the formation and growth of the baby.  Do not douche or use tampons or scented sanitary pads.  Do not cross your legs for long periods of time.  To prepare for the arrival of your baby: ? Take prenatal classes to understand, practice, and ask questions about labor and delivery. ? Make a trial run to the hospital. ? Visit the hospital and tour the maternity area. ? Arrange for maternity or paternity leave through employers. ? Arrange for family and friends to take care of pets while you are in the hospital. ? Purchase a rear-facing car seat and make sure you know how to install it in your car. ? Pack your hospital bag. ? Prepare the baby's nursery. Make sure to remove all pillows and stuffed animals from the baby's crib to prevent suffocation.  Visit your dentist if you have not gone during your pregnancy. Use a soft toothbrush to brush your teeth and be gentle when you floss. Contact a health care provider if:  You are unsure if you are in labor or if your water has broken.  You become dizzy.  You have mild pelvic cramps, pelvic pressure, or nagging pain in your abdominal area.  You have lower back pain.  You have persistent  nausea, vomiting, or diarrhea.  You have an unusual or bad smelling vaginal discharge.  You have pain when you urinate. Get help right away if:  Your water breaks before 37 weeks.  You have regular contractions less than 5 minutes apart before 37 weeks.  You have a fever.  You are leaking fluid from your vagina.  You have spotting or bleeding from your vagina.  You have severe abdominal pain or cramping.  You have rapid weight loss or weight gain.    You have shortness of breath with chest pain.  You notice sudden or extreme swelling of your face, hands, ankles, feet, or legs.  Your baby makes fewer than 10 movements in 2 hours.  You have severe headaches that do not go away when you take medicine.  You have vision changes. Summary  The third trimester is from week 28 through week 40, months 7 through 9. The third trimester is a time when the unborn baby (fetus) is growing rapidly.  During the third trimester, your discomfort may increase as you and your baby continue to gain weight. You may have abdominal, leg, and back pain, sleeping problems, and an increased need to urinate.  During the third trimester your breasts will keep growing and they will continue to become tender. A yellow fluid (colostrum) may leak from your breasts. This is the first milk you are producing for your baby.  False labor is a condition in which you feel small, irregular tightenings of the muscles in the womb (contractions) that eventually go away. These are called Braxton Hicks contractions. Contractions may last for hours, days, or even weeks before true labor sets in.  Signs of labor can include: abdominal cramps; regular contractions that start at 10 minutes apart and become stronger and more frequent with time; watery or bloody mucus discharge that comes from the vagina; increased pelvic pressure and dull back pain; and leaking of amniotic fluid. This information is not intended to replace advice  given to you by your health care provider. Make sure you discuss any questions you have with your health care provider. Document Released: 05/12/2001 Document Revised: 10/24/2015 Document Reviewed: 07/19/2012 Elsevier Interactive Patient Education  2017 [employer]  

## 2018-02-14 ENCOUNTER — Other Ambulatory Visit (HOSPITAL_COMMUNITY)
Admission: RE | Admit: 2018-02-14 | Discharge: 2018-02-14 | Disposition: A | Discharge status: Home / Self Care | Payer: 59 | Source: Ambulatory Visit | Attending: Obstetrics and Gynecology | Admitting: Obstetrics and Gynecology

## 2018-02-14 ENCOUNTER — Other Ambulatory Visit: Payer: Self-pay

## 2018-02-14 ENCOUNTER — Ambulatory Visit (INDEPENDENT_AMBULATORY_CARE_PROVIDER_SITE_OTHER): Payer: 59 | Admitting: Obstetrics and Gynecology

## 2018-02-14 ENCOUNTER — Encounter: Payer: Self-pay | Admitting: Obstetrics and Gynecology

## 2018-02-14 VITALS — BP 142/88 | Wt 205.0 lb

## 2018-02-14 DIAGNOSIS — O09899 Supervision of other high risk pregnancies, unspecified trimester: Secondary | ICD-10-CM

## 2018-02-14 DIAGNOSIS — R030 Elevated blood-pressure reading, without diagnosis of hypertension: Secondary | ICD-10-CM

## 2018-02-14 DIAGNOSIS — O0993 Supervision of high risk pregnancy, unspecified, third trimester: Secondary | ICD-10-CM

## 2018-02-14 DIAGNOSIS — O0990 Supervision of high risk pregnancy, unspecified, unspecified trimester: Secondary | ICD-10-CM | POA: Diagnosis present

## 2018-02-14 DIAGNOSIS — O365930 Maternal care for other known or suspected poor fetal growth, third trimester, not applicable or unspecified: Secondary | ICD-10-CM | POA: Diagnosis not present

## 2018-02-14 DIAGNOSIS — O9989 Other specified diseases and conditions complicating pregnancy, childbirth and the puerperium: Secondary | ICD-10-CM | POA: Diagnosis not present

## 2018-02-14 DIAGNOSIS — Z3A35 35 weeks gestation of pregnancy: Secondary | ICD-10-CM | POA: Diagnosis not present

## 2018-02-14 LAB — POCT URINALYSIS DIPSTICK OB
Glucose, UA: NEGATIVE
PROTEIN: NEGATIVE

## 2018-02-14 NOTE — Progress Notes (Signed)
Routine Prenatal Care Visit  Subjective  Tiffany SiresKimberly Conner is a 27 y.o. G1P0000 at 7671w2d being seen today for ongoing prenatal care.  She is currently monitored for the following issues for this high-risk pregnancy and has PSORIASIS; VSD; Supervision of high risk pregnancy, antepartum, third trimester; White coat syndrome without diagnosis of hypertension; Intrauterine growth restriction (IUGR) affecting care of mother, third trimester, single gestation; and Single umbilical artery affecting management of mother in singleton pregnancy, antepartum on their problem list.  ----------------------------------------------------------------------------------- Patient reports no complaints.   Denies HAs, visual changes, and RUQ pain Contractions: Not present. Vag. Bleeding: None.  Movement: Present. Denies leaking of fluid.  ----------------------------------------------------------------------------------- The following portions of the patient's history were reviewed and updated as appropriate: allergies, current medications, past family history, past medical history, past social history, past surgical history and problem list. Problem list updated.   Objective  Blood pressure (!) 142/88, weight 205 lb (93 kg), last menstrual period 06/12/2017. Pregravid weight 171 lb (77.6 kg) Total Weight Gain 34 lb (15.4 kg) Urinalysis: Urine Protein Negative  Urine Glucose Negative  Fetal Status:     Movement: Present     General:  Alert, oriented and cooperative. Patient is in no acute distress.  Skin: Skin is warm and dry. No rash noted.   Cardiovascular: Normal heart rate noted  Respiratory: Normal respiratory effort, no problems with respiration noted  Abdomen: Soft, gravid, appropriate for gestational age. Pain/Pressure: Absent     Pelvic:  Cervical exam deferred        Extremities: Normal range of motion.     Mental Status: Normal mood and affect. Normal behavior. Normal judgment and thought content.    NST: Baseline FHR: 140 beats/min Variability: moderate Accelerations: present Decelerations: absent Tocometry: not done  Interpretation:  INDICATIONS: single umbilical vessel and intrauterine growth retardation RESULTS:  A NST procedure was performed with FHR monitoring and a normal baseline established, appropriate time of 20-40 minutes of evaluation, and accels >2 seen w 15x15 characteristics.  Results show a REACTIVE NST.    Assessment   27 y.o. G1P0000 at 2071w2d by  03/19/2018, by Last Menstrual Period presenting for routine prenatal visit  Plan   FIRST Problems (from 08/06/17 to present)    Problem Noted Resolved   Intrauterine growth restriction (IUGR) affecting care of mother, third trimester, single gestation 01/25/2018 by Natale MilchSchuman, Christanna R, MD No   Overview Signed 01/25/2018 10:33 AM by Natale MilchSchuman, Christanna R, MD    Growth US every 3-4 weeks  [ ]  34  [ ] 38  Weekly UA dopplers and Fluid Twice weekly NSTs after 32 weeks      White coat syndrome without diagnosis of hypertension 09/30/2017 by Vena AustriaStaebler, Andreas, MD No   Overview Signed 09/30/2017  9:28 PM by Vena AustriaStaebler, Andreas, MD    [X]  Aspirin 81 mg daily after 12 weeks; discontinue after 36 weeks [X]  baseline labs with CBC, CMP, urine protein/creatinine ratio [ ]  no BP meds unless BPs become elevated [ ]  ultrasound for growth at 28, 32, 36 weeks    Current antihypertensives:  None   Baseline and surveillance labs ([facility], refresh links as needed)  Lab Results  Component Value Date   PLT 270 08/06/2017   CREATININE 0.57 09/13/2017   AST 18 09/13/2017   ALT 15 09/13/2017    Antenatal Testing CHTN - O10.919  Group I  BP < 140/90, no preeclampsia, AGA,  nml AFV, +/- meds    Group II BP > 140/90, on  meds, no preeclampsia, AGA, nml AFV  20-28-34-38  20-24-28-32-35-38  32//2 x wk  28//BPP wkly then 32//2 x wk  40 no meds; 39 meds  PRN or 37  Pre-eclampsia  GHTN - O13.9/Preeclampsia without  severe features  - O14.00   Preeclampsia with severe features - O14.10  Q 3-4wks  Q 2 wks  28//BPP wkly then 32//2 x wk  Inpatient  37  PRN or 34        Supervision of high risk pregnancy, antepartum, third trimester 08/06/2017 by Oswaldo Conroy, CNM No   Overview Addendum 02/10/2018  3:36 PM by Oswaldo Conroy, CNM    Clinic [facility] Prenatal Labs  Dating LMP = 9week [place] Blood type: A/Positive/-- (03/08 1446)   Genetic Screen 1 Screen: negative Antibody:Negative (03/08 1446)  Anatomic [place] Normal Female Rubella: 1.47 (03/08 1446) Varicella: Immune  GTT 90 RPR: Non Reactive (03/08 1446)   Rhogam Not applicable HBsAg: Negative (03/08 1446)   TDaP vaccine 01/19/2018                    HIV: Non Reactive (03/08 1446)   Baby Food                                GBS:   Contraception  Pap: 11/02/2016, NILM  CBB     CS/VBAC N/A   Support Person Adam (Husband)                 Preterm labor symptoms and general obstetric precautions including but not limited to vaginal bleeding, contractions, leaking of fluid and fetal movement were reviewed in detail with the patient. Please refer to After Visit Summary for other counseling recommendations.   - GBS today - NST reactive - Preeclampsia labs today with UPC ratio - continue twice weekly monitoring. Likely induction ~37 weeks, pending [facility] evaluation this coming week.  Return for Keep previously scheduled appointments (this week and next Monday).  Thomasene Mohair, MD, [facility], [facility] 02/14/2018 8:50 AM

## 2018-02-14 NOTE — Progress Notes (Signed)
NST today. No vb. No lof.  °

## 2018-02-14 NOTE — Patient Instructions (Signed)

## 2018-02-15 ENCOUNTER — Encounter: Payer: 59 | Admitting: Obstetrics and Gynecology

## 2018-02-15 ENCOUNTER — Other Ambulatory Visit: Payer: 59

## 2018-02-15 LAB — CBC
HEMATOCRIT: 38.1 % (ref 34.0–46.6)
Hemoglobin: 12.4 g/dL (ref 11.1–15.9)
MCH: 30.6 pg (ref 26.6–33.0)
MCHC: 32.5 g/dL (ref 31.5–35.7)
MCV: 94 fL (ref 79–97)
Platelets: 221 10*3/uL (ref 150–450)
RBC: 4.05 x10E6/uL (ref 3.77–5.28)
RDW: 12.8 % (ref 12.3–15.4)
WBC: 10 10*3/uL (ref 3.4–10.8)

## 2018-02-15 LAB — COMPREHENSIVE METABOLIC PANEL
ALBUMIN: 3.4 g/dL — AB (ref 3.5–5.5)
ALK PHOS: 86 IU/L (ref 39–117)
ALT: 11 IU/L (ref 0–32)
AST: 20 IU/L (ref 0–40)
Albumin/Globulin Ratio: 1.6 (ref 1.2–2.2)
BILIRUBIN TOTAL: 0.2 mg/dL (ref 0.0–1.2)
BUN / CREAT RATIO: 16 (ref 9–23)
BUN: 13 mg/dL (ref 6–20)
CHLORIDE: 107 mmol/L — AB (ref 96–106)
CO2: 21 mmol/L (ref 20–29)
Calcium: 9 mg/dL (ref 8.7–10.2)
Creatinine, Ser: 0.8 mg/dL (ref 0.57–1.00)
GFR calc Af Amer: 118 mL/min/{1.73_m2} (ref >59)
GFR calc non Af Amer: 102 mL/min/{1.73_m2} (ref >59)
GLOBULIN, TOTAL: 2.1 g/dL (ref 1.5–4.5)
Glucose: 60 mg/dL — ABNORMAL LOW (ref 65–99)
Potassium: 4.3 mmol/L (ref 3.5–5.2)
SODIUM: 138 mmol/L (ref 134–144)
TOTAL PROTEIN: 5.5 g/dL — AB (ref 6.0–8.5)

## 2018-02-15 LAB — PROTEIN / CREATININE RATIO, URINE
CREATININE, UR: 189.9 mg/dL
Protein, Ur: 18.4 mg/dL
Protein/Creat Ratio: 97 mg/g creat (ref 0–200)

## 2018-02-16 LAB — STREP GP B NAA: STREP GROUP B AG: POSITIVE — AB

## 2018-02-16 LAB — CERVICOVAGINAL ANCILLARY ONLY
Chlamydia: NEGATIVE
Neisseria Gonorrhea: NEGATIVE

## 2018-02-17 ENCOUNTER — Ambulatory Visit
Admission: RE | Admit: 2018-02-17 | Discharge: 2018-02-17 | Disposition: A | Discharge status: Home / Self Care | Payer: 59 | Source: Ambulatory Visit | Attending: Obstetrics and Gynecology | Admitting: Obstetrics and Gynecology

## 2018-02-17 ENCOUNTER — Other Ambulatory Visit: Payer: Self-pay | Admitting: Obstetrics and Gynecology

## 2018-02-17 DIAGNOSIS — Z3A35 35 weeks gestation of pregnancy: Secondary | ICD-10-CM | POA: Diagnosis not present

## 2018-02-17 DIAGNOSIS — O365930 Maternal care for other known or suspected poor fetal growth, third trimester, not applicable or unspecified: Secondary | ICD-10-CM | POA: Insufficient documentation

## 2018-02-17 DIAGNOSIS — O163 Unspecified maternal hypertension, third trimester: Secondary | ICD-10-CM | POA: Insufficient documentation

## 2018-02-17 NOTE — Progress Notes (Signed)
Elevated BP on arrival to clinic.  Pt has White Coat Syndrome.  Dr. Quin HoopEllestad verbally notified.

## 2018-02-18 ENCOUNTER — Encounter: Payer: 59 | Admitting: Obstetrics and Gynecology

## 2018-02-21 ENCOUNTER — Encounter: Payer: Self-pay | Admitting: Obstetrics and Gynecology

## 2018-02-21 ENCOUNTER — Ambulatory Visit (INDEPENDENT_AMBULATORY_CARE_PROVIDER_SITE_OTHER): Payer: 59 | Admitting: Obstetrics and Gynecology

## 2018-02-21 ENCOUNTER — Other Ambulatory Visit: Payer: Self-pay

## 2018-02-21 VITALS — BP 134/80 | Wt 202.0 lb

## 2018-02-21 DIAGNOSIS — Z3A36 36 weeks gestation of pregnancy: Secondary | ICD-10-CM | POA: Diagnosis not present

## 2018-02-21 DIAGNOSIS — O0993 Supervision of high risk pregnancy, unspecified, third trimester: Secondary | ICD-10-CM

## 2018-02-21 DIAGNOSIS — O365930 Maternal care for other known or suspected poor fetal growth, third trimester, not applicable or unspecified: Secondary | ICD-10-CM | POA: Diagnosis not present

## 2018-02-21 DIAGNOSIS — Q270 Congenital absence and hypoplasia of umbilical artery: Secondary | ICD-10-CM

## 2018-02-21 DIAGNOSIS — R030 Elevated blood-pressure reading, without diagnosis of hypertension: Secondary | ICD-10-CM

## 2018-02-21 DIAGNOSIS — O09899 Supervision of other high risk pregnancies, unspecified trimester: Secondary | ICD-10-CM

## 2018-02-21 NOTE — Patient Instructions (Signed)
Your Induction of Labor is scheduled for 02/26/2018 at 0600. Please arrive at the ER registration desk at the appointed time to register. From there you will be taken to Labor and Delivery.

## 2018-02-21 NOTE — Progress Notes (Signed)
  [facility] INDU[facility]ESS[facility] SCHEDULING Tiffany SiresKimberly Conner 15-Sep-1990 Medical record #: [id number] Phone #:  Home Phone [phone]  Mobile [phone]    Prenatal Provider:[facility] Delivering Group:[facility] Proposed admission date/time:02/26/18, 0600 Method of induction:Cytotec  Weight: Filed Weights09/23/19 1513Weight:202 lb (91.6 kg) BMI Body mass index is 32.6 kg/m. HIV Negative HSV Negative EDC Estimated Date of Delivery: 10/19/19based on:LMP  Gestational age on admission: 37 weeks Gravidity/parity:G1P0000  Cervix Score   0 1 2 3   Position Posterior Midposition Anterior   Consistency Firm Medium Soft   Effacement (%) 0-30 40-50 60-70 >80  Dilation (cm) Closed 1-2 3-4 >5  Baby's station -3 -2 -1 +1, +2   Bishop Score: low   Medical induction of labor   Medical Indications Adapted from ACOG Committee Opinion #[id number], "Medically Indicated Late Preterm and Early Term Deliveries," 2013.  PLACENTAL / UTERINE ISSUES FETAL ISSUES MATERNAL ISSUES  ? 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 Isoimmunization (37.0-38.6) ? Preeclampsia without severe features or gestational HTN (37.0)  ? Suspected accreta (34.0-35.6) ? Growth Restriction Mason Jim([place]), < 5th%ile ? Preeclampsia with severe features (34.0)  ? Prior classical CD, uterine window, rupture (36.0-37.6) ? Isolated (38.0-39.6) ? Chronic HTN (38.0-39.6)  ? Prior myomectomy (37.0-38.6) ? Concurrent findings (34.0-37.6) ? 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 Umbilical vein varix (37.0) ? Growth Restriction (Twins) ? Diabetes  ? Placental abruption (chronic) ? Di-Di Isolated (36.0-37.6) ? Pregestational, controlled (39.0)  OBSTETRIC ISSUES ? Di-Di concurrent findings (32.0-34.6) ? Pregestational, uncontrolled (37.0-39.0)  ? Postdates ? (41 weeks) ? Mo-Di isolated (32.0-34.6) ? Pregestational, vascular compromise (37.0- 39.0)  ? PPROM (34.0) ? Multiple Gestation ? Gestational, diet controlled (40.0)  ? Hx of IUFD (39.0 weeks) ? Di-Di  (38.0-38.6) ? Gestational, med controlled (39.0)  ? Polyhydramnios, mild/moderate; SDV 8-16 or AFI 25-35 (39.0) ? Mo-Di (36.0-37.6) ? Gestational, uncontrolled (38.0-39.0)  ? Oligohydramnios (36.0-37.6); MVP <2 cm  For indications not listed above, delivery recommendations from maternal-fetal medicine consultant occurred on: Date:02/17/18 with Dr. Leatha GildingLivingston for indication NG:EXBMWof:Fetal growth restriction, gestational hypertension  Provider Signature: Thomasene MohairStephen Shadana Pry Scheduled by: Jacki ConesLaurie, RN Date:02/21/2018 3:46 PM   Call [phone] to finalize the induction date/time  [id number] (07/17)

## 2018-02-21 NOTE — Progress Notes (Signed)
Routine Prenatal Care Visit  Subjective  Stefanee Mckell is a 27 y.o. G1P0000 at [redacted]w[redacted]d being seen today for ongoing prenatal care.  She is currently monitored for the following issues for this high-risk pregnancy and has PSORIASIS; VSD; Supervision of high risk pregnancy, antepartum, third trimester; White coat syndrome without diagnosis of hypertension; Intrauterine growth restriction (IUGR) affecting care of mother, third trimester, single gestation; and Single umbilical artery affecting management of mother in singleton pregnancy, antepartum on their problem list.  ----------------------------------------------------------------------------------- Patient reports no complaints.   Contractions: Not present. Vag. Bleeding: None.  Movement: Present. Denies leaking of fluid.  [facility] visit last with with normal UA doppler interrogation.  Per [facility] PN, induce labor at 37 weeks (no later than 38 weeks), due to FGR (<5th%ile) and hypertensive issues this pregnancy.  She denies symptoms today of HA, visual changes and RUQ pain.  ----------------------------------------------------------------------------------- The following portions of the patient's history were reviewed and updated as appropriate: allergies, current medications, past family history, past medical history, past social history, past surgical history and problem list. Problem list updated.  Objective  Blood pressure 134/80, weight 202 lb (91.6 kg), last menstrual period 06/12/2017. Pregravid weight 171 lb (77.6 kg) Total Weight Gain 31 lb (14.1 kg) Urinalysis: Urine Protein    Urine Glucose    Fetal Status: Fetal Heart Rate (bpm): 145   Movement: Present     General:  Alert, oriented and cooperative. Patient is in no acute distress.  Skin: Skin is warm and dry. No rash noted.   Cardiovascular: Normal heart rate noted  Respiratory: Normal respiratory effort, no problems with respiration noted  Abdomen: Soft, gravid, appropriate for  gestational age. Pain/Pressure: Absent     Pelvic:  Cervical exam deferred        Extremities: Normal range of motion.  Edema: None  Mental Status: Normal mood and affect. Normal behavior. Normal judgment and thought content.   NST: Baseline FHR: 145 beats/min Variability: moderate Accelerations: present Decelerations: absent Tocometry: not done  Interpretation:  INDICATIONS: fetal (intrauterine) growth restriction RESULTS:  A NST procedure was performed with FHR monitoring and a normal baseline established, appropriate time of 20-40 minutes of evaluation, and accels >2 seen w 15x15 characteristics.  Results show a REACTIVE NST.    Assessment   27 y.o. G1P0000 at [redacted]w[redacted]d by  03/19/2018, by Last Menstrual Period presenting for routine prenatal visit  Plan   FIRST Problems (from 08/06/17 to present)    Problem Noted Resolved   Intrauterine growth restriction (IUGR) affecting care of mother, third trimester, single gestation 01/25/2018 by Natale Milch, MD No   Overview Signed 01/25/2018 10:33 AM by Natale Milch, MD    Growth [place] every 3-4 weeks  [ ]  34  [ ] 38  Weekly UA dopplers and Fluid Twice weekly NSTs after 32 weeks      White coat syndrome without diagnosis of hypertension 09/30/2017 by Vena Austria, MD No   Overview Signed 09/30/2017  9:28 PM by Vena Austria, MD    [X]  Aspirin 81 mg daily after 12 weeks; discontinue after 36 weeks [X]  baseline labs with CBC, CMP, urine protein/creatinine ratio [ ]  no BP meds unless BPs become elevated [ ]  ultrasound for growth at 28, 32, 36 weeks  Current antihypertensives:  None   Baseline and surveillance labs (pulled in from [facility], refresh links as needed)  Lab Results  Component Value Date   PLT 270 08/06/2017   CREATININE 0.57 09/13/2017   AST 18 09/13/2017  ALT 15 09/13/2017   Antenatal Testing CHTN - O10.919  Group I  BP < 140/90, no preeclampsia, AGA,  nml AFV, +/- meds    Group II BP > 140/90, on  meds, no preeclampsia, AGA, nml AFV  20-28-34-38  20-24-28-32-35-38  32//2 x wk  28//BPP wkly then 32//2 x wk  40 no meds; 39 meds  PRN or 37  Pre-eclampsia  GHTN - O13.9/Preeclampsia without severe features  - O14.00   Preeclampsia with severe features - O14.10  Q 3-4wks  Q 2 wks  28//BPP wkly then 32//2 x wk  Inpatient  37  PRN or 34        Supervision of high risk pregnancy, antepartum, third trimester 08/06/2017 by Oswaldo ConroySchmid, Jacelyn Y, CNM No   Overview Addendum 02/10/2018  3:36 PM by Oswaldo ConroySchmid, Jacelyn Y, CNM    [facility] Prenatal Labs  Dating LMP = 9week US Blood type: A/Positive/-- (03/08 1446)   Genetic Screen 1 Screen: negative Antibody:Negative (03/08 1446)  Anatomic US Normal Female Rubella: 1.47 (03/08 1446) Varicella: Immune  GTT 90 RPR: Non Reactive (03/08 1446)   Rhogam Not applicable HBsAg: Negative (03/08 1446)   TDaP vaccine 01/19/2018                    HIV: Non Reactive (03/08 1446)   Baby Food                                GBS:   Contraception  Pap: 11/02/2016, NILM  CBB     CS/VBAC N/A   Support Person Adam (Husband)               Preterm labor symptoms and general obstetric precautions including but not limited to vaginal bleeding, contractions, leaking of fluid and fetal movement were reviewed in detail with the patient. Please refer to After Visit Summary for other counseling recommendations.   - IOL scheduled for 02/16/18 at 0600.    Return for [facility] appt 9/26, see instructions for induction scheduling.  Thomasene MohairStephen Koda Defrank, MD, Merlinda FrederickFACOG [facility], [facility] 02/23/2018 8:29 AM

## 2018-02-22 ENCOUNTER — Other Ambulatory Visit: Payer: 59

## 2018-02-22 ENCOUNTER — Encounter: Payer: 59 | Admitting: Advanced Practice Midwife

## 2018-02-22 ENCOUNTER — Encounter: Payer: Self-pay | Admitting: Primary Care

## 2018-02-22 ENCOUNTER — Ambulatory Visit: Payer: 59 | Admitting: Primary Care

## 2018-02-22 VITALS — BP 134/80 | HR 73 | Temp 98.2°F | Ht 66.0 in | Wt 198.8 lb

## 2018-02-22 DIAGNOSIS — J069 Acute upper respiratory infection, unspecified: Secondary | ICD-10-CM | POA: Diagnosis not present

## 2018-02-22 NOTE — Patient Instructions (Signed)
Your symptoms are representative of a viral illness which will resolve on its own over time. Our goal is to treat your symptoms in order to aid your body in the healing process and to make you more comfortable.   Nasal Congestion/Ear Pressure: Try using Flonase (fluticasone) nasal spray. Instill 1 spray in each nostril twice daily.   Start an antihistamine like Zyrtec and take once daily.   Make sure to drink plenty of water and rest.  It was a pleasure meeting you!

## 2018-02-22 NOTE — Progress Notes (Signed)
Subjective:    Patient ID: Tiffany Conner, female    DOB: 31-Jul-1990, 27 y.o.   MRN: [id number]  HPI  [name] is a 27 year old female who is [redacted] weeks pregnant who presents today with a chief complaint of sinus pressure.  She also reports nasal congestion, sore throat, mild cough. Her symptoms began 3-4 days ago with sore throat and rhinorrhea. This morning she woke up with sinus pressure and burning to the nares. She's taken Tylenol with some improvement.   She denies fevers, wheezing.   Review of Systems  Constitutional: Negative for fatigue and fever.  HENT: Positive for congestion and sinus pressure. Negative for ear pain.   Respiratory: Positive for cough. Negative for shortness of breath and wheezing.        Past Medical History:  Diagnosis Date  . Heart murmur   . VSD (ventricular septal defect)      Social History   Socioeconomic History  . Marital status: Married    Spouse name: Tiffany Conner  . Number of children: Not on file  . Years of education: 7  . Highest education level: Not on file  Occupational History  . Occupation: Tiffany Conner COORDINATOR  Social Needs  . Financial resource strain: Not on file  . Food insecurity:    Worry: Not on file    Inability: Not on file  . Transportation needs:    Medical: Not on file    Non-medical: Not on file  Tobacco Use  . Smoking status: Never Smoker  . Smokeless tobacco: Never Used  Substance and Sexual Activity  . Alcohol use: No    Frequency: Never  . Drug use: No  . Sexual activity: Yes    Birth control/protection: None  Lifestyle  . Physical activity:    Days per week: Not on file    Minutes per session: Not on file  . Stress: Not on file  Relationships  . Social connections:    Talks on phone: Not on file    Gets together: Not on file    Attends religious service: Not on file    Active member of club or organization: Not on file    Attends meetings of clubs or organizations: Not on file    Relationship  status: Not on file  . Intimate partner violence:    Fear of current or ex partner: Not on file    Emotionally abused: Not on file    Physically abused: Not on file    Forced sexual activity: Not on file  Other Topics Concern  . Not on file  Social History Narrative   Married.   Works as a Nurse, adult for [employer].    Graduate of [employer].   Enjoys reading exercise.     Past Surgical History:  Procedure Laterality Date  . MYRINGOTOMY    . TIBIA FRACTURE SURGERY      Family History  Problem Relation Age of Onset  . Ovarian cancer Other     No Known Allergies  Current Outpatient Medications on File Prior to Visit  Medication Sig Dispense Refill  . Clobetasol Propionate Emulsion 0.05 % topical foam     . Prenatal Vit-Fe Fumarate-FA (PRENATAL MULTIVITAMIN) TABS tablet Take 1 tablet by mouth daily at 12 noon.     No current facility-administered medications on file prior to visit.     BP 134/80   Pulse 73   Temp 98.2 F (36.8 C) (Oral)   Ht 5\' 6"  (1.676 m)  Wt 198 lb 12 oz (90.2 kg)   LMP 06/12/2017   SpO2 99%   BMI 32.08 kg/m    Objective:   Physical Exam  Constitutional: She appears well-nourished. She does not appear ill.  HENT:  Right Ear: Tympanic membrane and ear canal normal.  Left Ear: Tympanic membrane and ear canal normal.  Nose: Mucosal edema present. Right sinus exhibits maxillary sinus tenderness. Right sinus exhibits no frontal sinus tenderness. Left sinus exhibits maxillary sinus tenderness. Left sinus exhibits no frontal sinus tenderness.  Mouth/Throat: Oropharynx is clear and moist.  Neck: Neck supple.  Cardiovascular: Normal rate and regular rhythm.  Respiratory: Effort normal and breath sounds normal. She has no wheezes.  Skin: Skin is warm and dry.           Assessment & Plan:  URI:  Sore throat, nasal congestion, sinus pressure x 3-4 days. Exam today consistent for viral URI, perhaps early viral sinusitis. No suspicion for  bacterial involvement at this point. Discussed supportive care such as Flonase and Zyrtec. Continue Tylenol as needed. Return precautions provided.   Doreene NestKatherine K Timica Marcom, NP

## 2018-02-23 ENCOUNTER — Encounter: Payer: Self-pay | Admitting: Obstetrics and Gynecology

## 2018-02-23 NOTE — Progress Notes (Signed)
OB History & Physical   History of Present Illness:  Chief Complaint: Induction of labor  HPI:  Tiffany Conner is a 27 y.o. G1P0000 female at [redacted]w[redacted]d dated by LMP consistent with 9 week ultrasound.  Her pregnancy has been complicated by fetal growth restriction and episodic hypertension with concern for development of gestational hypertension.    She denies contractions.   She denies leakage of fluid.   She denies vaginal bleeding.   She reports fetal movement.    Maternal Medical History:   Past Medical History:  Diagnosis Date  . Heart murmur   . VSD (ventricular septal defect)     Past Surgical History:  Procedure Laterality Date  . MYRINGOTOMY    . TIBIA FRACTURE SURGERY      No Known Allergies  Prior to Admission medications   Medication Sig Start Date End Date Taking? Authorizing Provider  Clobetasol Propionate Emulsion 0.05 % topical foam  06/22/16   [provider]  Prenatal Vit-Fe Fumarate-FA (PRENATAL MULTIVITAMIN) TABS tablet Take 1 tablet by mouth daily at 12 noon.    [provider]    OB History  Gravida Para Term Preterm AB Living  1 0 0 0 0 0  SAB TAB Ectopic Multiple Live Births  0 0 0 0 0    # Outcome Date GA Lbr Len/2nd Weight Sex Delivery Anes PTL Lv  1 Current             Prenatal care site: [facility]  Social History: She  reports that she has never smoked. She has never used smokeless tobacco. She reports that she does not drink alcohol or use drugs.  Family History: family history includes Ovarian cancer in her other.   Review of Systems: Negative x 10 systems reviewed except as noted in the HPI.    Physical Exam:  Vital Signs: BP 134/80   Wt 202 lb (91.6 kg)   LMP 06/12/2017   BMI 32.60 kg/m  Constitutional: Well nourished, well developed female in no acute distress.  HEENT: normal Skin: Warm and dry.  Cardiovascular: Regular rate and rhythm.   Extremity: no edema  Respiratory: Clear to auscultation bilateral.  Normal respiratory effort Abdomen: FHT present and gravid/NT Back: no CVAT Neuro: DTRs 2+, Cranial nerves grossly intact Psych: Alert and Oriented x3. No memory deficits. Normal mood and affect.  MS: normal gait, normal bilateral lower extremity ROM/strength/stability.  Pertinent Results:  Prenatal Labs: Blood type/Rh A positive  Antibody screen negative  Rubella Immune  Varicella Immune    RPR NR  HBsAg negative  HIV negative  GC negative  Chlamydia negative  Genetic screening First trimester screen neg  1 hour GTT 90  3 hour GTT n/a  GBS positive on 02/14/18    Assessment:  Tiffany Conner is a 27 y.o. G1P0000 female at [redacted]w[redacted]d with fetal growth restriction and gestational hypertension.   Plan:  1. Admit to Labor & Delivery  2. CBC, T&S, Clrs, IVF 3. GBS positive - PCN  Thomasene Mohair, MD 02/23/2018 8:32 AM

## 2018-02-24 ENCOUNTER — Other Ambulatory Visit: Payer: Self-pay | Admitting: Maternal and Fetal Medicine

## 2018-02-24 ENCOUNTER — Ambulatory Visit
Admission: RE | Admit: 2018-02-24 | Discharge: 2018-02-24 | Disposition: A | Discharge status: Home / Self Care | Payer: 59 | Source: Ambulatory Visit | Attending: Maternal and Fetal Medicine | Admitting: Maternal and Fetal Medicine

## 2018-02-24 DIAGNOSIS — Q270 Congenital absence and hypoplasia of umbilical artery: Secondary | ICD-10-CM

## 2018-02-24 DIAGNOSIS — O365930 Maternal care for other known or suspected poor fetal growth, third trimester, not applicable or unspecified: Secondary | ICD-10-CM

## 2018-02-24 DIAGNOSIS — R030 Elevated blood-pressure reading, without diagnosis of hypertension: Secondary | ICD-10-CM

## 2018-02-24 DIAGNOSIS — Z3A36 36 weeks gestation of pregnancy: Secondary | ICD-10-CM

## 2018-02-24 DIAGNOSIS — O0993 Supervision of high risk pregnancy, unspecified, third trimester: Secondary | ICD-10-CM

## 2018-02-25 ENCOUNTER — Other Ambulatory Visit: Payer: Self-pay | Admitting: Obstetrics and Gynecology

## 2018-02-25 ENCOUNTER — Encounter: Payer: 59 | Admitting: Maternal Newborn

## 2018-02-26 ENCOUNTER — Inpatient Hospital Stay
Admission: EM | Admit: 2018-02-26 | Discharge: 2018-03-03 | DRG: 787 | Disposition: A | Discharge status: Home / Self Care | Payer: 59 | Attending: Certified Nurse Midwife | Admitting: Certified Nurse Midwife

## 2018-02-26 ENCOUNTER — Other Ambulatory Visit: Payer: Self-pay

## 2018-02-26 DIAGNOSIS — D62 Acute posthemorrhagic anemia: Secondary | ICD-10-CM | POA: Diagnosis not present

## 2018-02-26 DIAGNOSIS — Z3A37 37 weeks gestation of pregnancy: Secondary | ICD-10-CM | POA: Diagnosis not present

## 2018-02-26 DIAGNOSIS — O9081 Anemia of the puerperium: Secondary | ICD-10-CM | POA: Diagnosis not present

## 2018-02-26 DIAGNOSIS — O43113 Circumvallate placenta, third trimester: Secondary | ICD-10-CM | POA: Diagnosis present

## 2018-02-26 DIAGNOSIS — O99824 Streptococcus B carrier state complicating childbirth: Secondary | ICD-10-CM | POA: Diagnosis present

## 2018-02-26 DIAGNOSIS — O365930 Maternal care for other known or suspected poor fetal growth, third trimester, not applicable or unspecified: Principal | ICD-10-CM | POA: Diagnosis present

## 2018-02-26 DIAGNOSIS — Z98891 History of uterine scar from previous surgery: Secondary | ICD-10-CM

## 2018-02-26 DIAGNOSIS — O365990 Maternal care for other known or suspected poor fetal growth, unspecified trimester, not applicable or unspecified: Secondary | ICD-10-CM | POA: Diagnosis present

## 2018-02-26 DIAGNOSIS — O1002 Pre-existing essential hypertension complicating childbirth: Secondary | ICD-10-CM | POA: Diagnosis present

## 2018-02-26 LAB — CBC
HCT: 35.2 % (ref 35.0–47.0)
Hemoglobin: 13 g/dL (ref 12.0–16.0)
MCH: 33.7 pg (ref 26.0–34.0)
MCHC: 36.8 g/dL — ABNORMAL HIGH (ref 32.0–36.0)
MCV: 91.6 fL (ref 80.0–100.0)
PLATELETS: 205 10*3/uL (ref 150–440)
RBC: 3.84 MIL/uL (ref 3.80–5.20)
RDW: 12.7 % (ref 11.5–14.5)
WBC: 10.9 10*3/uL (ref 3.6–11.0)

## 2018-02-26 LAB — TYPE AND SCREEN
ABO/RH(D): A POS
Antibody Screen: NEGATIVE

## 2018-02-26 MED ORDER — AMMONIA AROMATIC IN INHA
0.3000 mL | Freq: Once | RESPIRATORY_TRACT | Status: DC | PRN
  Filled 2018-02-26: qty 10

## 2018-02-26 MED ORDER — FENTANYL CITRATE (PF) 100 MCG/2ML IJ SOLN: 50.0000 ug | INTRAMUSCULAR | Status: DC | PRN

## 2018-02-26 MED ORDER — SODIUM CHLORIDE 0.9 % IV SOLN
5.0000 10*6.[IU] | Freq: Once | INTRAVENOUS | Status: AC
  Administered 2018-02-26: 5 10*6.[IU] via INTRAVENOUS

## 2018-02-26 MED ORDER — TERBUTALINE SULFATE 1 MG/ML IJ SOLN: 0.2500 mg | Freq: Once | INTRAMUSCULAR | Status: DC | PRN

## 2018-02-26 MED ORDER — SODIUM CHLORIDE 0.9 % IJ SOLN
INTRAMUSCULAR | Status: AC
  Filled 2018-02-26: qty 50

## 2018-02-26 MED ORDER — OXYTOCIN 40 UNITS IN LACTATED RINGERS INFUSION - SIMPLE MED
2.5000 [IU]/h | INTRAVENOUS | Status: DC
  Filled 2018-02-26 (×2): qty 1000

## 2018-02-26 MED ORDER — LIDOCAINE HCL (PF) 1 % IJ SOLN
30.0000 mL | INTRAMUSCULAR | Status: AC | PRN
  Administered 2018-02-27: 1.2 mL via SUBCUTANEOUS
  Filled 2018-02-26: qty 30

## 2018-02-26 MED ORDER — LACTATED RINGERS IV SOLN
INTRAVENOUS | Status: DC
  Administered 2018-02-26 – 2018-02-27 (×4): via INTRAVENOUS

## 2018-02-26 MED ORDER — FENTANYL CITRATE (PF) 100 MCG/2ML IJ SOLN
50.0000 ug | INTRAMUSCULAR | Status: DC | PRN
  Administered 2018-02-27: 100 ug via INTRAVENOUS
  Filled 2018-02-26: qty 2

## 2018-02-26 MED ORDER — ONDANSETRON HCL 4 MG/2ML IJ SOLN
4.0000 mg | Freq: Four times a day (QID) | INTRAMUSCULAR | Status: DC | PRN
  Administered 2018-02-27: 4 mg via INTRAVENOUS
  Filled 2018-02-26: qty 2

## 2018-02-26 MED ORDER — SODIUM CHLORIDE 0.9 % IV SOLN
INTRAVENOUS | Status: AC
  Filled 2018-02-26: qty 5

## 2018-02-26 MED ORDER — MISOPROSTOL 25 MCG QUARTER TABLET
ORAL_TABLET | ORAL | Status: AC
  Administered 2018-02-26: 25 ug via VAGINAL
  Filled 2018-02-26: qty 1

## 2018-02-26 MED ORDER — OXYTOCIN 10 UNIT/ML IJ SOLN
INTRAMUSCULAR | Status: AC
  Filled 2018-02-26: qty 2

## 2018-02-26 MED ORDER — MISOPROSTOL 25 MCG QUARTER TABLET
25.0000 ug | ORAL_TABLET | ORAL | Status: DC | PRN
  Administered 2018-02-26: 25 ug via VAGINAL
  Filled 2018-02-26 (×2): qty 1

## 2018-02-26 MED ORDER — OXYTOCIN BOLUS FROM INFUSION: 500.0000 mL | Freq: Once | INTRAVENOUS | Status: DC

## 2018-02-26 MED ORDER — PENICILLIN G 3 MILLION UNITS IVPB - SIMPLE MED
3.0000 10*6.[IU] | INTRAVENOUS | Status: DC
  Administered 2018-02-26 – 2018-02-27 (×8): 3 10*6.[IU] via INTRAVENOUS
  Filled 2018-02-26 (×9): qty 100
  Filled 2018-02-26 (×6): qty 3
  Filled 2018-02-26: qty 100
  Filled 2018-02-26: qty 3

## 2018-02-26 MED ORDER — SODIUM CHLORIDE FLUSH 0.9 % IV SOLN
INTRAVENOUS | Status: AC
  Filled 2018-02-26: qty 10

## 2018-02-26 MED ORDER — LACTATED RINGERS IV SOLN
500.0000 mL | INTRAVENOUS | Status: DC | PRN
  Administered 2018-02-27 (×3): 500 mL via INTRAVENOUS

## 2018-02-26 MED ORDER — MISOPROSTOL 200 MCG PO TABS
800.0000 ug | ORAL_TABLET | Freq: Once | ORAL | Status: DC | PRN
  Filled 2018-02-26: qty 4

## 2018-02-26 NOTE — Progress Notes (Signed)
  Labor Progress Note   27 y.o. G1P0000 @ [redacted]w[redacted]d, admitted for Pregnancy, Labor Management. Induction of Labor for IUGR and hypertension.  Subjective:  Comfortable on birth ball. Rating pain with contractions as 3/10.  Objective:  BP (!) 154/80 (BP Location: Left Arm)   Pulse 69   Temp 98.3 F (36.8 C) (Oral)   Resp 17   Ht 5\' 6"  (1.676 m)   Wt 89.8 kg   LMP 06/12/2017   BMI 31.96 kg/m  Abd: mild Extr: trace to 1+ bilateral pedal edema SVE: 1/60/-3  EFM: FHR: 150 bpm, variability: moderate,  accelerations:  Present,  decelerations:  Absent Toco: Frequency: Every 2-5 minutes, Duration: 60-80 seconds and Intensity: mild Labs: I have reviewed the patient's lab results.   Assessment & Plan:  G1P0000 @ [redacted]w[redacted]d, admitted for  Pregnancy and Labor/Delivery Management  1. Pain management: none. 2. FWB: FHT category I. 3. ID: GBS positive, continue antibiotics. 4. Labor management: Placed second dose of Cytotec as contractions have spaced out. Consider Foley bulb at next exam.  All discussed with patient.  Marcelyn Bruins, CNM 02/26/2018  3:34 PM

## 2018-02-26 NOTE — H&P (Signed)
Date of Initial H&P: 02/22/2018 Dr Jean Rosenthal  History reviewed, patient examined, no change in H&P.  27 yo G1 P0 with EDC=03/19/2018 based on LMP and confirmed with 9 week ultrasound presents at 37 weeks for induction of labor due to fetal growth restriction. Ultrasound 02/07/18 EFW was 4th% (4#2oz). Antepartum testing has been reassuring with normal Dopplers, BPPs 8/8, and normal AFIs and reactive NSTs. Risk factors for IUGR: single umbilical artery and ?chronic hypertension (?white coat syndrome). Tiffany Conner does not use drugs or smoke cigarettes. Her prenatal care at [facility] has also been remarkable for a ventricular septal defect,  psoriasis as well as the following: Clinic [facility] Prenatal Labs  Dating LMP = 9week [place] Blood type: A/Positive/-- (03/08 1446)   Genetic Screen 1 Screen: negative Antibody:Negative (03/08 1446)  Anatomic [place] Normal Female-[name] Rubella: 1.47 (03/08 1446) Varicella: Immune  GTT 90 RPR: Non Reactive (03/08 1446)   Rhogam Not applicable HBsAg: Negative (03/08 1446)   TDaP vaccine 01/19/2018                    HIV: Non Reactive (03/08 1446)   Baby Food       Breast                         GBS: positive  Contraception Undecided Pap: 11/02/2016, NILM  CBB     CS/VBAC N/A   Support Person Adam (Husband)        She has gained 26.84 pounds during the pregnancy   Exam: BP (!) 115/52 (BP Location: Left Arm)   Pulse 78   Temp 99 F (37.2 C) (Oral)   Resp 18   Ht 5\' 6"  (1.676 m)   Wt 89.8 kg   LMP 06/12/2017   BMI 31.96 kg/m    General: gravid WF, appears anxious, in NAD Heart: RRR with Grade II-III systolic murmur best heard at base Lungs: normal respiratory effort/ CTAB Abdomen: cephalic presentation, soft, NT. EFW 5#10oz Cervix: closed/ 30%/-2/med/mid FHR: 155 baseline with accelerations to 170, moderate variability Toco: acontractile  Extremities:  A: IUP at 37 weeks for IOL for IUGR  Bishop score 3  FWB: cat 1 tracing Single umbilical artery Possible chronic  hypertension-currently normotensive. Positive GBS VSD  P: PCN for GBS Discussed with patient purpose and risks of induction. Explained risks of hyperstimulation, fetal intolerance, failed induction, and Cesarean section. Plan Cytotec at this time for cervical ripening. Patient agrees with plan. Continuous fetal monitoring Probable foley bulb when able Clear liquids Breast A POS/RI/VI Contraception: undecided  Farrel Conners, CNM

## 2018-02-26 NOTE — Progress Notes (Signed)
  Labor Progress Note   27 y.o. G1P0000 @ [redacted]w[redacted]d , admitted for  Pregnancy, Labor Management. IOL for IUGR and hypertension  Subjective:  Contractions are slightly more uncomfortable but still coping well without medication for now.  Objective:  BP (!) 146/72 (BP Location: Left Arm)   Pulse 77   Temp 98.4 F (36.9 C) (Oral)   Resp 18   Ht 5\' 6"  (1.676 m)   Wt 89.8 kg   LMP 06/12/2017   BMI 31.96 kg/m  Abd: mild Extr: trace to 1+ bilateral pedal edema SVE: 1/60/-2  EFM: FHR: 145 bpm, variability: moderate,  accelerations:  Present,  decelerations:  Absent Toco: Frequency: Every 2-6 minutes, Duration:40-70 seconds and Intensity: mild   Assessment & Plan:  G1P0000 @ [redacted]w[redacted]d, admitted for  Pregnancy and Labor/Delivery Management  1. Pain management: none. 2. FWB: FHT category I.  3. ID: GBS positive, continue antibiotics 4. Labor management: Foley bulb placed.   All discussed with patient.  Marcelyn Bruins, CNM 02/26/2018  9:43 PM

## 2018-02-27 ENCOUNTER — Inpatient Hospital Stay: Payer: 59 | Admitting: Anesthesiology

## 2018-02-27 ENCOUNTER — Encounter
Admission: EM | Disposition: A | Discharge status: Home / Self Care | Payer: Self-pay | Source: Home / Self Care | Attending: Certified Nurse Midwife

## 2018-02-27 DIAGNOSIS — Z3A37 37 weeks gestation of pregnancy: Secondary | ICD-10-CM

## 2018-02-27 DIAGNOSIS — O365930 Maternal care for other known or suspected poor fetal growth, third trimester, not applicable or unspecified: Secondary | ICD-10-CM

## 2018-02-27 DIAGNOSIS — O43113 Circumvallate placenta, third trimester: Secondary | ICD-10-CM

## 2018-02-27 DIAGNOSIS — O99824 Streptococcus B carrier state complicating childbirth: Secondary | ICD-10-CM

## 2018-02-27 LAB — PROTEIN / CREATININE RATIO, URINE
CREATININE, URINE: 111 mg/dL
PROTEIN CREATININE RATIO: 0.08 mg/mg{creat} (ref 0.00–0.15)
Total Protein, Urine: 9 mg/dL

## 2018-02-27 SURGERY — Surgical Case
Anesthesia: Epidural

## 2018-02-27 MED ORDER — MEPERIDINE HCL 25 MG/ML IJ SOLN: 6.2500 mg | INTRAMUSCULAR | Status: DC | PRN

## 2018-02-27 MED ORDER — BUPIVACAINE HCL (PF) 0.5 % IJ SOLN
INTRAMUSCULAR | Status: DC | PRN
  Administered 2018-02-27: 10 mL

## 2018-02-27 MED ORDER — ONDANSETRON HCL 4 MG/2ML IJ SOLN
INTRAMUSCULAR | Status: AC
  Filled 2018-02-27: qty 2

## 2018-02-27 MED ORDER — SUCCINYLCHOLINE CHLORIDE 20 MG/ML IJ SOLN
INTRAMUSCULAR | Status: AC
  Filled 2018-02-27: qty 1

## 2018-02-27 MED ORDER — NALBUPHINE HCL 10 MG/ML IJ SOLN: 5.0000 mg | Freq: Once | INTRAMUSCULAR | Status: DC | PRN

## 2018-02-27 MED ORDER — NALOXONE HCL 0.4 MG/ML IJ SOLN: 0.4000 mg | INTRAMUSCULAR | Status: DC | PRN

## 2018-02-27 MED ORDER — OXYTOCIN 40 UNITS IN LACTATED RINGERS INFUSION - SIMPLE MED
1.0000 m[IU]/min | INTRAVENOUS | Status: DC
  Administered 2018-02-27: 1000 mL via INTRAVENOUS
  Filled 2018-02-27: qty 1000

## 2018-02-27 MED ORDER — DIPHENHYDRAMINE HCL 50 MG/ML IJ SOLN: 12.5000 mg | INTRAMUSCULAR | Status: DC | PRN

## 2018-02-27 MED ORDER — ONDANSETRON HCL 4 MG/2ML IJ SOLN
INTRAMUSCULAR | Status: DC | PRN
  Administered 2018-02-27: 4 mg via INTRAVENOUS

## 2018-02-27 MED ORDER — FENTANYL 2.5 MCG/ML W/ROPIVACAINE 0.15% IN NS 100 ML EPIDURAL (ARMC): 12.0000 mL/h | EPIDURAL | Status: DC

## 2018-02-27 MED ORDER — MORPHINE SULFATE (PF) 0.5 MG/ML IJ SOLN
INTRAMUSCULAR | Status: DC | PRN
  Administered 2018-02-27: 2 mg via EPIDURAL

## 2018-02-27 MED ORDER — SODIUM CHLORIDE 0.9 % IJ SOLN
INTRAMUSCULAR | Status: AC
  Filled 2018-02-27: qty 50

## 2018-02-27 MED ORDER — OXYTOCIN 40 UNITS IN LACTATED RINGERS INFUSION - SIMPLE MED
2.5000 [IU]/h | INTRAVENOUS | Status: AC
  Administered 2018-02-27: 2.5 [IU]/h via INTRAVENOUS

## 2018-02-27 MED ORDER — HYDRALAZINE HCL 20 MG/ML IJ SOLN: 10.0000 mg | INTRAMUSCULAR | Status: DC | PRN

## 2018-02-27 MED ORDER — ACETAMINOPHEN 500 MG PO TABS
1000.0000 mg | ORAL_TABLET | Freq: Four times a day (QID) | ORAL | Status: AC
  Administered 2018-02-28 (×3): 1000 mg via ORAL
  Filled 2018-02-27 (×3): qty 2

## 2018-02-27 MED ORDER — PROPOFOL 10 MG/ML IV BOLUS
INTRAVENOUS | Status: AC
  Filled 2018-02-27: qty 20

## 2018-02-27 MED ORDER — NALBUPHINE HCL 10 MG/ML IJ SOLN: 5.0000 mg | INTRAMUSCULAR | Status: DC | PRN

## 2018-02-27 MED ORDER — LIDOCAINE 2% (20 MG/ML) 5 ML SYRINGE
INTRAMUSCULAR | Status: DC | PRN
  Administered 2018-02-27 (×3): 100 mg via INTRAVENOUS

## 2018-02-27 MED ORDER — BUPIVACAINE HCL (PF) 0.5 % IJ SOLN
INTRAMUSCULAR | Status: AC
  Filled 2018-02-27: qty 30

## 2018-02-27 MED ORDER — EPHEDRINE 5 MG/ML INJ: 10.0000 mg | INTRAVENOUS | Status: DC | PRN

## 2018-02-27 MED ORDER — LACTATED RINGERS IV SOLN: 500.0000 mL | Freq: Once | INTRAVENOUS | Status: DC

## 2018-02-27 MED ORDER — MORPHINE SULFATE (PF) 0.5 MG/ML IJ SOLN
INTRAMUSCULAR | Status: AC
  Filled 2018-02-27: qty 10

## 2018-02-27 MED ORDER — LABETALOL HCL 5 MG/ML IV SOLN
40.0000 mg | INTRAVENOUS | Status: DC | PRN
  Filled 2018-02-27: qty 8

## 2018-02-27 MED ORDER — LABETALOL HCL 5 MG/ML IV SOLN
20.0000 mg | INTRAVENOUS | Status: DC | PRN
  Filled 2018-02-27: qty 4

## 2018-02-27 MED ORDER — PHENYLEPHRINE 40 MCG/ML (10ML) SYRINGE FOR IV PUSH (FOR BLOOD PRESSURE SUPPORT): 80.0000 ug | PREFILLED_SYRINGE | INTRAVENOUS | Status: DC | PRN

## 2018-02-27 MED ORDER — SOD CITRATE-CITRIC ACID 500-334 MG/5ML PO SOLN
ORAL | Status: AC
  Filled 2018-02-27: qty 15

## 2018-02-27 MED ORDER — LIDOCAINE-EPINEPHRINE (PF) 1.5 %-1:200000 IJ SOLN
INTRAMUSCULAR | Status: DC | PRN
  Administered 2018-02-27: 3 mL via EPIDURAL

## 2018-02-27 MED ORDER — LABETALOL HCL 5 MG/ML IV SOLN
80.0000 mg | INTRAVENOUS | Status: DC | PRN
  Filled 2018-02-27: qty 16

## 2018-02-27 MED ORDER — BUPIVACAINE HCL 0.5 % IJ SOLN
20.0000 mL | INTRAMUSCULAR | Status: DC
  Filled 2018-02-27: qty 20

## 2018-02-27 MED ORDER — FENTANYL 2.5 MCG/ML W/ROPIVACAINE 0.15% IN NS 100 ML EPIDURAL (ARMC)
EPIDURAL | Status: DC | PRN
  Administered 2018-02-27: 12 mL/h via EPIDURAL

## 2018-02-27 MED ORDER — KETOROLAC TROMETHAMINE 30 MG/ML IJ SOLN: 30.0000 mg | Freq: Four times a day (QID) | INTRAMUSCULAR | Status: AC | PRN

## 2018-02-27 MED ORDER — NALOXONE HCL 4 MG/10ML IJ SOLN
1.0000 ug/kg/h | INTRAVENOUS | Status: DC | PRN
  Filled 2018-02-27: qty 5

## 2018-02-27 MED ORDER — SOD CITRATE-CITRIC ACID 500-334 MG/5ML PO SOLN
30.0000 mL | ORAL | Status: AC
  Administered 2018-02-27: 30 mL via ORAL

## 2018-02-27 MED ORDER — FENTANYL CITRATE (PF) 100 MCG/2ML IJ SOLN: 25.0000 ug | INTRAMUSCULAR | Status: DC | PRN

## 2018-02-27 MED ORDER — SODIUM CHLORIDE 0.9% FLUSH: 3.0000 mL | INTRAVENOUS | Status: DC | PRN

## 2018-02-27 MED ORDER — KETOROLAC TROMETHAMINE 30 MG/ML IJ SOLN
30.0000 mg | Freq: Four times a day (QID) | INTRAMUSCULAR | Status: AC | PRN
  Administered 2018-02-27 – 2018-02-28 (×4): 30 mg via INTRAVENOUS
  Filled 2018-02-27 (×3): qty 1

## 2018-02-27 MED ORDER — ONDANSETRON HCL 4 MG/2ML IJ SOLN: 4.0000 mg | Freq: Once | INTRAMUSCULAR | Status: DC | PRN

## 2018-02-27 MED ORDER — KETOROLAC TROMETHAMINE 30 MG/ML IJ SOLN
INTRAMUSCULAR | Status: AC
  Filled 2018-02-27: qty 1

## 2018-02-27 MED ORDER — FENTANYL 2.5 MCG/ML W/ROPIVACAINE 0.15% IN NS 100 ML EPIDURAL (ARMC)
EPIDURAL | Status: AC
  Filled 2018-02-27: qty 100

## 2018-02-27 MED ORDER — CEFAZOLIN SODIUM-DEXTROSE 2-4 GM/100ML-% IV SOLN
2.0000 g | INTRAVENOUS | Status: AC
  Administered 2018-02-27: 2 g via INTRAVENOUS
  Filled 2018-02-27: qty 100

## 2018-02-27 MED ORDER — DIPHENHYDRAMINE HCL 25 MG PO CAPS: 25.0000 mg | ORAL_CAPSULE | ORAL | Status: DC | PRN

## 2018-02-27 MED ORDER — OXYTOCIN 40 UNITS IN LACTATED RINGERS INFUSION - SIMPLE MED
1.0000 m[IU]/min | INTRAVENOUS | Status: DC
  Administered 2018-02-27: 2 m[IU]/min via INTRAVENOUS
  Filled 2018-02-27: qty 1000

## 2018-02-27 MED ORDER — BUPIVACAINE 0.25 % ON-Q PUMP DUAL CATH 400 ML
400.0000 mL | INJECTION | Status: DC
  Filled 2018-02-27: qty 400

## 2018-02-27 MED ORDER — PHENYLEPHRINE HCL 10 MG/ML IJ SOLN
INTRAMUSCULAR | Status: AC
  Filled 2018-02-27: qty 1

## 2018-02-27 MED ORDER — SODIUM CHLORIDE 0.9 % IV SOLN
500.0000 mg | Freq: Once | INTRAVENOUS | Status: AC
  Administered 2018-02-27: 500 mg via INTRAVENOUS
  Filled 2018-02-27: qty 500

## 2018-02-27 SURGICAL SUPPLY — 34 items
ADH SKN CLS APL DERMABOND .7 (GAUZE/BANDAGES/DRESSINGS) ×1
BAG COUNTER SPONGE EZ (MISCELLANEOUS) ×3 IMPLANT
BAG SPNG 4X4 CLR HAZ (MISCELLANEOUS) ×2
CANISTER SUCT 3000ML PPV (MISCELLANEOUS) ×3 IMPLANT
CATH KIT ON-Q SILVERSOAK 5 (CATHETERS) ×2 IMPLANT
CATH KIT ON-Q SILVERSOAK 5IN (CATHETERS) ×6 IMPLANT
CHLORAPREP W/TINT 26ML (MISCELLANEOUS) ×6 IMPLANT
CLOSURE WOUND 1/2 X4 (GAUZE/BANDAGES/DRESSINGS) ×1
COUNTER SPONGE BAG EZ (MISCELLANEOUS) ×2
DERMABOND ADVANCED (GAUZE/BANDAGES/DRESSINGS) ×2
DERMABOND ADVANCED .7 DNX12 (GAUZE/BANDAGES/DRESSINGS) ×1 IMPLANT
DRESSING SURGICEL FIBRLLR 1X2 (HEMOSTASIS) IMPLANT
DRSG OPSITE POSTOP 4X10 (GAUZE/BANDAGES/DRESSINGS) ×3 IMPLANT
DRSG SURGICEL FIBRILLAR 1X2 (HEMOSTASIS) ×3
DRSG TELFA 3X8 NADH (GAUZE/BANDAGES/DRESSINGS) IMPLANT
ELECT CAUTERY BLADE 6.4 (BLADE) ×1 IMPLANT
ELECT REM PT RETURN 9FT ADLT (ELECTROSURGICAL) ×3
ELECTRODE REM PT RTRN 9FT ADLT (ELECTROSURGICAL) ×1 IMPLANT
GAUZE SPONGE 4X4 12PLY STRL (GAUZE/BANDAGES/DRESSINGS) ×1 IMPLANT
GLOVE BIO SURGEON STRL SZ7 (GLOVE) ×7 IMPLANT
GLOVE INDICATOR 7.5 STRL GRN (GLOVE) ×7 IMPLANT
GOWN STRL REUS W/ TWL LRG LVL3 (GOWN DISPOSABLE) ×3 IMPLANT
GOWN STRL REUS W/TWL LRG LVL3 (GOWN DISPOSABLE) ×9
NS IRRIG 1000ML POUR BTL (IV SOLUTION) ×3 IMPLANT
PACK C SECTION AR (MISCELLANEOUS) ×3 IMPLANT
PAD DRESSING TELFA 3X8 NADH (GAUZE/BANDAGES/DRESSINGS) ×1 IMPLANT
PAD OB MATERNITY 4.3X12.25 (PERSONAL CARE ITEMS) ×3 IMPLANT
PAD PREP 24X41 OB/GYN DISP (PERSONAL CARE ITEMS) ×3 IMPLANT
STRIP CLOSURE SKIN 1/2X4 (GAUZE/BANDAGES/DRESSINGS) ×2 IMPLANT
SUT MNCRL AB 4-0 PS2 18 (SUTURE) ×3 IMPLANT
SUT PDS AB 1 TP1 96 (SUTURE) ×6 IMPLANT
SUT VIC AB 0 CTX 36 (SUTURE) ×6
SUT VIC AB 0 CTX36XBRD ANBCTRL (SUTURE) ×2 IMPLANT
SUT VIC AB 2-0 CT1 36 (SUTURE) ×3 IMPLANT

## 2018-02-27 NOTE — Anesthesia Procedure Notes (Signed)
Epidural Patient location during procedure: OB Start time: 02/27/2018 6:19 PM End time: 02/27/2018 6:41 PM  Staffing Performed: anesthesiologist   Preanesthetic Checklist Completed: patient identified, site marked, surgical consent, pre-op evaluation, timeout performed, IV checked, risks and benefits discussed and monitors and equipment checked  Epidural Patient position: sitting Prep: Betadine Patient monitoring: heart rate, continuous pulse ox and blood pressure Approach: midline Location: L4-L5 Injection technique: LOR saline  Needle:  Needle type: Tuohy  Needle gauge: 17 G Needle length: 9 cm and 9 Needle insertion depth: 9 cm Catheter type: closed end flexible Catheter size: 20 Guage Catheter at skin depth: 15 cm Test dose: negative and 1.5% lidocaine with Epi 1:200 K  Assessment Events: blood not aspirated, injection not painful, no injection resistance, negative IV test and no paresthesia  Additional Notes   Patient tolerated the insertion well without complications.Reason for block:procedure for pain

## 2018-02-27 NOTE — Transfer of Care (Signed)
Immediate Anesthesia Transfer of Care Note  Patient: Tiffany Conner  Procedure(s) Performed: CESAREAN SECTION (N/A )  Patient Location: Mother/Baby  Anesthesia Type:Epidural  Level of Consciousness: awake, alert  and oriented  Airway & Oxygen Therapy: Patient Spontanous Breathing  Post-op Assessment: Report given to RN and Post -op Vital signs reviewed and stable  Post vital signs: Reviewed and stable  Last Vitals:  Vitals Value Taken Time  BP    Temp    Pulse    Resp    SpO2      Last Pain:  Vitals:   02/27/18 1917  TempSrc: Oral  PainSc:          Complications: No apparent anesthesia complications

## 2018-02-27 NOTE — Progress Notes (Signed)
  Labor Progress Note   27 y.o. G1P0000 @ [redacted]w[redacted]d , admitted for  Pregnancy, Labor Management. IOL for IUGR and hypertension  Subjective:  Resting in bed. Reported that Foley bulb came out shortly after 1 am.  Objective:  BP 132/67 (BP Location: Left Arm)   Pulse 88   Temp 98.2 F (36.8 C) (Oral)   Resp 18   Ht 5\' 6"  (1.676 m)   Wt 89.8 kg   LMP 06/12/2017   BMI 31.96 kg/m   SVE: 2/60/-2 (RN exam)  EFM: FHR: 145 bpm, variability: moderate,  accelerations:  Present,  decelerations:  Absent Toco: every 1-4 minutes, 50-70 second duration, difficult to trace when she changes position at times.  Assessment & Plan:  G1P0000 @ [redacted]w[redacted]d, admitted for  Pregnancy and Labor/Delivery Management  1. Pain management: nothing currently, orders for IV pain medication/nitrous as desired. 2. FWB: FHT category I.  3. ID: GBS positive, continue antibiotics 4. Labor management: Start Pitocin.   Marcelyn Bruins, CNM 02/27/2018  1:41 AM

## 2018-02-27 NOTE — Progress Notes (Signed)
   Subjective:  Came to discuss fetal heart rate tracing with patient since stopping pitocin.    Objective:   Vitals: Blood pressure 134/60, pulse 70, temperature 98.4 F (36.9 C), temperature source Oral, resp. rate 17, height 5\' 6"  (1.676 m), weight 89.8 kg, last menstrual period 06/12/2017, SpO2 100 %. General: NAD Abdomen: gravid, non-tender Cervical Exam:  Dilation: 2 Effacement (%): 60, 70 Cervical Position: Anterior Station: -2, -1 Presentation: Vertex Exam by:: Bridgette Habermann CNM  FHT: 155, currently moderate, no deceleration currently with positive accels (as opposed to 160's, minimal to moderate, intermittent late decelerations, and no accels prior to the last 30 minutes) Toco: q18min  Results for orders placed or performed during the hospital encounter of 02/26/18 (from the past 24 hour(s))  Protein / creatinine ratio, urine     Status: None   Collection Time: 02/27/18  2:07 PM  Result Value Ref Range   Creatinine, Urine 111 mg/dL   Total Protein, Urine 9 mg/dL   Protein Creatinine Ratio 0.08 0.00 - 0.15 mg/mg[Cre]    Assessment:   27 y.o. G1P0000 [redacted]w[redacted]d IOL for IUGR  Plan:   1) Labor - discussed that following stopping pitocin and epidural fetus has displayed minimal to moderate variability with some subtle intermittent late decelerations.  In the setting of IUGR my concern is for utero-placental insufficiency.  We discussed that with restarting pitocin we would be further stressing the utero-placental interface and would like see further loss of variability or more prominent late decelerations.  In order to facilitate delivery I discussed either proceeding with expediting delivery via primary cesarean section vs restarting pitocin.  However, should fetal heart tracing show further evidence of fetal intolerance to labor my recommendation would be to proceed with primary cesarean section.  The patient and her family will discuss the options laid out and let me know of their  decision.  In addition the fact that we are still remote from delivery needs to be taken into consideration as well. - After discussing with her family the patient opts to proceed with 1LTCS as opposed to restarting pitocin.  The patient was counseled regarding risk and benefits to proceeding with Cesarean section to expedite delivery.  Risk of cesarean section were discussed including risk of bleeding and need for potential intraoperative or postoperative blood transfusion with a rate of approximately 5% quoted for all Cesarean sections, risk of injury to adjacent organs including but not limited to bowl and bladder, the need for additional surgical procedures to address such injuries, and the risk of infection.  The risk of continued attempts at vaginal delivery include but are note limited to worsening fetal or maternal status.  After consideration of options the patient is amenable to proceed with primary cesarean section for delivery.  2) Fetus - category II tracing  3) CHTN/white coat hypertension - normotensive to mild range BPs  Vena Austria, MD, [facility], [facility] 02/27/2018, 8:55 PM

## 2018-02-27 NOTE — Anesthesia Post-op Follow-up Note (Signed)
Anesthesia QCDR form completed.        

## 2018-02-27 NOTE — Progress Notes (Signed)
  Labor Progress Note   27 y.o. G1P0000 @ [redacted]w[redacted]d , admitted for  Pregnancy, Labor Management. IOL for IUGR and hypertension  Subjective:  Ambulatory in room, contractions a little more uncomfortable but coping without pain medicine.  Objective:  BP (!) 143/81   Pulse 62   Temp 99.2 F (37.3 C) (Oral)   Resp 16   Ht 5\' 6"  (1.676 m)   Wt 89.8 kg   LMP 06/12/2017   BMI 31.96 kg/m  Abd: mild Extr: trace to 1+ bilateral pedal edema SVE: 2/60/-2  EFM: FHR: 145 bpm, variability: moderate,  accelerations:  Present,  decelerations:  Absent Toco: Frequency: Every 1.5-4 minutes minutes, Duration: 60-80 seconds and Intensity: mild   Assessment & Plan:  G1P0000 @ [redacted]w[redacted]d, admitted for  Pregnancy and Labor/Delivery Management  1. Pain management: none currently, patient choice as contractions become more painful. 2. FWB: FHT category I.  3. ID: GBS positive, continue antibiotics 4. Labor management: Continue to titrate Pitocin. AROM when appropriate, may consider a Pitocin break later today so she can eat.  All discussed with patient.  Marcelyn Bruins, CNM 02/27/2018  9:41 AM

## 2018-02-27 NOTE — Progress Notes (Signed)
  Labor Progress Note   27 y.o. G1P0000 @ [redacted]w[redacted]d , admitted for  Pregnancy, Labor Management. IOL for IUGR and hypertension.  Subjective:  More uncomfortable now with [facility] catheter inserted. Had some bloody show with insertion. Feeling some nausea.  Requested IV medication.  Objective:  BP (!) 153/91   Pulse 85   Temp 98.8 F (37.1 C) (Oral)   Resp 16   Ht 5\' 6"  (1.676 m)   Wt 89.8 kg   LMP 06/12/2017   BMI 31.96 kg/m  Abd: mild to moderate Extr: trace to 1+ bilateral pedal edema SVE: 2/60/-2  EFM: FHR: 135 bpm, variability: moderate,  accelerations:  Present,  decelerations:  Absent Toco: some spontaneous uterine activity, irregular  Assessment & Plan:  G1P0000 @ [redacted]w[redacted]d, admitted for  Pregnancy and Labor/Delivery Management  1. Pain management: IV sedation. Wants to try nitrous oxide when enough time has passed from Fentanyl dose. 2. FWB: FHT category I now, was category II earlier so Pitocin was stopped and a bolus given.  3. ID: GBS positive, continue IV antibiotics 4. Labor management: Restart Pitocin at 2, monitor for expulsion of Cook catheter.  All discussed with patient.  Marcelyn Bruins, CNM 02/27/2018  5:10 PM

## 2018-02-27 NOTE — Op Note (Signed)
Preoperative Diagnosis: 1) 27 y.o. G1P1001 at [redacted]w[redacted]d 2) Fetal intolerance to labor 3) Intrauterine growth restriction  Postoperative Diagnosis: 1) 27 y.o. G1P1001 at [redacted]w[redacted]d 2) Fetal intolerance to labor 3) Intrauterine growth restriction 4) Circumvallate placenta 5) Clear fluid but cord appeared meconium stained  Operation Performed: Primary low transverse C-section via pfannenstiel skin incision  Indication: Fetal intolerance to labor  Anesthesia: .Epidural  Primary Surgeon: Vena Austria, MD  Assistant: Kathrin Ruddy, CNM  Preoperative Antibiotics: 2g ancef and 500mg  of azithromycin  Estimated Blood Loss: 700 mL  IV Fluids:   Urine Output::  Drains or Tubes: Foley to gravity drainage, ON-Q catheter system  Implants: none  Specimens Removed: none  Complications: none  Intraoperative Findings:  Normal tubes ovaries and uterus.  Delivery resulted in the birth of a liveborn female, APGAR (1 MIN): 8  APGAR (5 MINS): 9, weight 2270g 5lbs 0oz  Patient Condition: stable  Procedure in Detail:  Patient was taken to the operating room were she was administered regional anesthesia.  She was positioned in the supine position, prepped and draped in the  Usual sterile fashion.  Prior to proceeding with the case a time out was performed and the level of anesthetic was checked and noted to be adequate.  Utilizing the scalpel a pfannenstiel skin incision was made 2cm above the pubic symphysis and carried down sharply to the the level of the rectus fascia.  The fascia was incised in the midline using the scalpel and then extended using mayo scissors.  The superior border of the rectus fascia was grasped with two Kocher clamps and the underlying rectus muscles were dissected of the fascia using blunt dissection.  The median raphae was incised using Mayo scissors.   The inferior border of the rectus fascia was dissected of the rectus muscles in a similar fashion.  The midline was  identified, the peritoneum was entered bluntly and expanded using manual tractions.  The uterus was noted to be in a none rotated position.  Next the bladder blade was placed retracting the bladder caudally.  A bladder flap was not created.  A low transverse incision was scored on the lower uterine segment.  The hysterotomy was entered bluntly using the operators finger.  The hysterotomy incision was extended using manual traction.  The operators hand was placed within the hysterotomy position noting the fetus to be within the OA position.  The vertex was grasped, flexed, brought to the incision, and delivered a traumatically using fundal pressure.  The remainder of the body delivered with ease.  The infant was suctioned, cord was clamped and cut before handing off to the awaiting neonatologist.  The placenta was delivered using manual extraction.  The uterus was exteriorized, wiped clean of clots and debris using two moist laps.  The hysterotomy was closed using a two layer closure of 0 Vicryl, with the first being a running locked, the second a vertical imbricating.  The uterus was returned to the abdomen.  The peritoneal gutters were wiped clean of clots and debris using two moist laps.  The hysterotomy incision was re-inspected noted to be hemostatic.  There was some oozing from the bladder serosa and fibrillar was applied. The rectus muscles were inspected noted to be hemostatic.  The superior border of the rectus fascia was grasped with a Kocher clamp.  The ON-Q trocars were then placed 4cm above the superior border of the incision and tunneled subfascially.  The introducers were removed and the catheters were threaded  through the sleeves after which the sleeves were removed.  The fascia was closed using a looped #1 PDS in a running fashion taking 1cm by 1cm bites.  The subcutaneous tissue was irrigated using warm saline, hemostasis achieved using the bovie.  The subcutaneous dead space was less than 3cm and  was not closed.  The skin was closed using 4-0 Monocryl in a subcuticular fashion.  Sponge needle and instrument counts were corrects times two.  The patient tolerated the procedure well and was taken to the recovery room in stable condition.

## 2018-02-27 NOTE — Discharge Summary (Signed)
Obstetric Discharge Summary Reason for Admission: induction of labor and IUGR, fetal intolerance to labor Prenatal Procedures: none Intrapartum Procedures: cesarean: low cervical, transverse Postpartum Procedures: exploration and reclosure of wound 9/30 by Dr. Jean Rosenthal Complications-Operative and Postpartum: acute blood loss anemia Hemoglobin  Date Value Ref Range Status  03/03/2018 7.7 (L) 12.0 - 16.0 g/dL Final  13/24/4010 27.2 11.1 - 15.9 g/dL Final   HCT  Date Value Ref Range Status  03/03/2018 21.6 (L) 35.0 - 47.0 % Final   Hematocrit  Date Value Ref Range Status  02/14/2018 38.1 34.0 - 46.6 % Final    Physical Exam:  General: alert, appears stated age and no distress Lochia: appropriate Uterine Fundus: firm Incision: no dehiscence, surrounding ecchymoses, incision with minor drainage center of incision DVT Evaluation: No evidence of DVT seen on physical exam.  Discharge Diagnoses: Term Pregnancy-delivered  Discharge Information: Date: 03/03/2018 Activity: pelvic rest and no lifting >10lbs for 6 weeks, no driving for 2 weeks Diet: routine Allergies as of 03/03/2018   No Known Allergies     Medication List    TAKE these medications   Clobetasol Propionate Emulsion 0.05 % topical foam   FUSION 65-65-25-30 MG Caps Take 1 tablet by mouth daily.   ibuprofen 600 MG tablet Commonly known as:  ADVIL,MOTRIN Take 1 tablet (600 mg total) by mouth every 6 (six) hours as needed for mild pain or cramping.   oxyCODONE-acetaminophen 5-325 MG tablet Commonly known as:  PERCOCET/ROXICET Take 1 tablet by mouth every 4 (four) hours as needed (pain scale 4-7).   prenatal multivitamin Tabs tablet Take 1 tablet by mouth daily at 12 noon.            Discharge Care Instructions  (From admission, onward)         Start     Ordered   03/03/18 0000  Discharge wound care:    Comments:  You may apply a light dressing for minor discharge from the incision or to keep waistbands  of clothing from rubbing.  You may also have been discharge with a clear dressing in which case this will be removed at your postoperative clinic visit.  You may shower, use soap on your incision.  Avoid baths or soaking the incision in the first 6 weeks following your surgery..   03/03/18 5366          Condition: stable Discharge to: home Follow-up Information    Vena Austria, MD In 1 week.   Specialty:  Obstetrics and Gynecology Why:  For wound re-check and BP check Contact information: [address] [place] [address] [phone]           Newborn Data: Live born female  Birth Weight: 5 lb 0.1 oz (2270 g) APGAR: 8, 9  Newborn Delivery   Birth date/time:  02[birth year]/2018 22:11:00 Delivery type:  C-Section, Low Transverse Trial of labor:  Yes C-section categorization:  Primary     Home with mother.  Vena Austria 03/03/2018, 9:12 AM

## 2018-02-27 NOTE — Anesthesia Preprocedure Evaluation (Signed)
Anesthesia Evaluation  Patient identified by MRN, date of birth, ID band Patient awake    Reviewed: Allergy & Precautions, NPO status , Patient's Chart, lab work & pertinent test results  History of Anesthesia Complications Negative for: history of anesthetic complications  Airway Mallampati: II       Dental   Pulmonary neg sleep apnea, neg COPD,           Cardiovascular hypertension, (-) Past MI and (-) CHF (-) dysrhythmias + Valvular Problems/Murmurs (VSD)      Neuro/Psych neg Seizures    GI/Hepatic Neg liver ROS, neg GERD  ,  Endo/Other  neg diabetes  Renal/GU negative Renal ROS     Musculoskeletal   Abdominal   Peds  Hematology   Anesthesia Other Findings   Reproductive/Obstetrics                             Anesthesia Physical Anesthesia Plan  ASA: II  Anesthesia Plan: Epidural   Post-op Pain Management:    Induction:   PONV Risk Score and Plan:   Airway Management Planned:   Additional Equipment:   Intra-op Plan:   Post-operative Plan:   Informed Consent: I have reviewed the patients History and Physical, chart, labs and discussed the procedure including the risks, benefits and alternatives for the proposed anesthesia with the patient or authorized representative who has indicated his/her understanding and acceptance.     Plan Discussed with:   Anesthesia Plan Comments:         Anesthesia Quick Evaluation

## 2018-02-28 ENCOUNTER — Encounter: Payer: 59 | Admitting: Advanced Practice Midwife

## 2018-02-28 ENCOUNTER — Encounter: Payer: Self-pay | Admitting: Obstetrics and Gynecology

## 2018-02-28 LAB — CBC
HCT: 25.8 % — ABNORMAL LOW (ref 35.0–47.0)
HCT: 22.4 % — ABNORMAL LOW (ref 35.0–47.0)
HEMOGLOBIN: 8.3 g/dL — AB (ref 12.0–16.0)
Hemoglobin: 9.2 g/dL — ABNORMAL LOW (ref 12.0–16.0)
MCH: 32.9 pg (ref 26.0–34.0)
MCH: 33.9 pg (ref 26.0–34.0)
MCHC: 35.7 g/dL (ref 32.0–36.0)
MCHC: 36.9 g/dL — AB (ref 32.0–36.0)
MCV: 92.3 fL (ref 80.0–100.0)
MCV: 92.1 fL (ref 80.0–100.0)
PLATELETS: 202 10*3/uL (ref 150–440)
Platelets: 208 10*3/uL (ref 150–440)
RBC: 2.8 MIL/uL — ABNORMAL LOW (ref 3.80–5.20)
RBC: 2.44 MIL/uL — AB (ref 3.80–5.20)
RDW: 12.8 % (ref 11.5–14.5)
RDW: 12.9 % (ref 11.5–14.5)
WBC: 12.4 10*3/uL — ABNORMAL HIGH (ref 3.6–11.0)
WBC: 10 10*3/uL (ref 3.6–11.0)

## 2018-02-28 LAB — RPR: RPR Ser Ql: NONREACTIVE

## 2018-02-28 MED ORDER — LACTATED RINGERS IV SOLN
INTRAVENOUS | Status: DC
  Administered 2018-02-28: 22:00:00 via INTRAVENOUS

## 2018-02-28 MED ORDER — OXYCODONE-ACETAMINOPHEN 5-325 MG PO TABS
2.0000 | ORAL_TABLET | ORAL | Status: DC | PRN
  Administered 2018-02-28 – 2018-03-01 (×2): 2 via ORAL
  Filled 2018-02-28 (×3): qty 2

## 2018-02-28 MED ORDER — IBUPROFEN 600 MG PO TABS
600.0000 mg | ORAL_TABLET | Freq: Four times a day (QID) | ORAL | Status: DC | PRN
  Administered 2018-03-01 – 2018-03-03 (×11): 600 mg via ORAL
  Filled 2018-02-28 (×11): qty 1

## 2018-02-28 MED ORDER — FERROUS FUMARATE 324 (106 FE) MG PO TABS
1.0000 | ORAL_TABLET | Freq: Every day | ORAL | Status: DC
  Administered 2018-02-28 – 2018-03-01 (×2): 106 mg via ORAL
  Filled 2018-02-28 (×2): qty 1

## 2018-02-28 MED ORDER — COCONUT OIL OIL
1.0000 "application " | TOPICAL_OIL | Status: DC | PRN
  Administered 2018-03-01: 1 via TOPICAL
  Filled 2018-02-28: qty 120

## 2018-02-28 MED ORDER — SENNOSIDES-DOCUSATE SODIUM 8.6-50 MG PO TABS
2.0000 | ORAL_TABLET | ORAL | Status: DC
  Administered 2018-02-28 – 2018-03-03 (×4): 2 via ORAL
  Filled 2018-02-28 (×4): qty 2

## 2018-02-28 MED ORDER — SIMETHICONE 80 MG PO CHEW
80.0000 mg | CHEWABLE_TABLET | Freq: Three times a day (TID) | ORAL | Status: DC
  Administered 2018-02-28 – 2018-03-03 (×11): 80 mg via ORAL
  Filled 2018-02-28 (×10): qty 1

## 2018-02-28 MED ORDER — SIMETHICONE 80 MG PO CHEW
80.0000 mg | CHEWABLE_TABLET | ORAL | Status: DC
  Administered 2018-02-28 – 2018-03-03 (×4): 80 mg via ORAL
  Filled 2018-02-28 (×4): qty 1

## 2018-02-28 MED ORDER — BUPIVACAINE 0.25 % ON-Q PUMP DUAL CATH 400 ML
400.0000 mL | INJECTION | Status: AC
  Filled 2018-02-28: qty 400

## 2018-02-28 MED ORDER — OXYCODONE-ACETAMINOPHEN 5-325 MG PO TABS
1.0000 | ORAL_TABLET | ORAL | Status: DC | PRN
  Administered 2018-03-01 – 2018-03-03 (×9): 1 via ORAL
  Filled 2018-02-28 (×8): qty 1

## 2018-02-28 MED ORDER — CEFAZOLIN SODIUM-DEXTROSE 2-4 GM/100ML-% IV SOLN
2.0000 g | Freq: Three times a day (TID) | INTRAVENOUS | Status: AC
  Administered 2018-02-28 – 2018-03-01 (×2): 2 g via INTRAVENOUS
  Filled 2018-02-28 (×2): qty 100

## 2018-02-28 MED ORDER — DIPHENHYDRAMINE HCL 25 MG PO CAPS: 25.0000 mg | ORAL_CAPSULE | Freq: Four times a day (QID) | ORAL | Status: DC | PRN

## 2018-02-28 MED ORDER — MENTHOL 3 MG MT LOZG
1.0000 | LOZENGE | OROMUCOSAL | Status: DC | PRN
  Filled 2018-02-28: qty 9

## 2018-02-28 MED ORDER — BUPIVACAINE ON-Q PAIN PUMP (FOR ORDER SET NO CHG): INJECTION | Status: DC

## 2018-02-28 MED ORDER — LIDOCAINE HCL (PF) 1 % IJ SOLN
20.0000 mL | Freq: Once | INTRAMUSCULAR | Status: AC
  Administered 2018-02-28: 20 mL via INTRADERMAL

## 2018-02-28 MED ORDER — PRENATAL MULTIVITAMIN CH
1.0000 | ORAL_TABLET | Freq: Every day | ORAL | Status: DC
  Administered 2018-02-28 – 2018-03-03 (×4): 1 via ORAL
  Filled 2018-02-28 (×4): qty 1

## 2018-02-28 MED ORDER — DIBUCAINE 1 % RE OINT: 1.0000 "application " | TOPICAL_OINTMENT | RECTAL | Status: DC | PRN

## 2018-02-28 MED ORDER — SIMETHICONE 80 MG PO CHEW
80.0000 mg | CHEWABLE_TABLET | ORAL | Status: DC | PRN
  Administered 2018-02-28: 80 mg via ORAL
  Filled 2018-02-28 (×2): qty 1

## 2018-02-28 MED ORDER — WITCH HAZEL-GLYCERIN EX PADS: 1.0000 "application " | MEDICATED_PAD | CUTANEOUS | Status: DC | PRN

## 2018-02-28 MED ORDER — ACETAMINOPHEN 325 MG PO TABS: 650.0000 mg | ORAL_TABLET | ORAL | Status: DC | PRN

## 2018-02-28 NOTE — Progress Notes (Signed)
Called to room by patient for bleeding around her incision. On assessment, there was blood leaking from the top of the pressure dressing coming from the incision. Farrel Conners was paged and came to bedside to assess. Dr. Jean Rosenthal paged and at bedside at 2025. This RN and shift coordinator and Farrel Conners at bedside to assist Dr. Jean Rosenthal. Dressing reapplied after procedure and no more further bleeding from incision site. Called back to room by patient for bleeding around her on-q pump site. Noted blood pooling under the tegaderm by the on-q pump. Farrel Conners notified and was present at beside to place more dermabond and redress site. No further bleeding at this time. Will continue to monitor. Otilio Jefferson, RN

## 2018-02-28 NOTE — Progress Notes (Signed)
POD #1  Vital signs: BP 131/86 (BP Location: Right Arm)   Pulse (!) 105   Temp 98.7 F (37.1 C) (Oral)   Resp 18   Ht 5\' 6"  (1.676 m)   Wt 89.8 kg   LMP 06/12/2017   SpO2 98%   Breastfeeding? Unknown   BMI 31.96 kg/m   General: patient pale, alert, awake, has been back and forth to the NICU  The ON Q dressing was saturated. The dressing and Tegaderm were removed and no bleeding was seen coming from the base of the catheters. The dermabond was still intact. Peaking under the pressure dressing over the incision, the 4x$s were saturated and apparently the blood was seeping upward from the incision. The pressure dressing was removed from the incision and bloody drainage was leaking from the right side of the wound and spurted out of the left side of the wound also. Pressure dressing was reapplied to the incision. Dr Jean Rosenthal was notified and is on his way in to evaluate. CBC ordered.  Farrel Conners, CNM

## 2018-02-28 NOTE — Progress Notes (Signed)
Called to room by patient. Patient c/o pain on both sides with deep breaths making it hard for her to breathe. Upon assessment, patient is pale and diaphoretic however VSS, bleeding scant, and fundus is 1U, firm, and midline. Tylenol and Toradol given for pain, simethicone given for possible gas pains. Hemoglobin also dropped from 13 to 9.2. CNM paged and updated. Stated to watch closely and call if pain does not get better with pain medications. Will continue to monitor. Otilio Jefferson, RN

## 2018-02-28 NOTE — Progress Notes (Signed)
S: Called to see patient with complaints of pain on inspiration. She reports that her pain has improved with medication.   O: Vital signs are within normal limits: Blood pressure 115/62, pulse 90, temperature 98.8 F (37.1 C), temperature source Oral, resp. rate 18,  SpO2 99 %,   General: NAD, calm and cooperative Cardiovascular: S1 and S2 auscultated, regular rate and rhythm Respiratory: clear to auscultation bilaterally Abdomen: non-tender, non distended, hypoactive BS Extremities: SCDs in place, no pain in BLE Psych: Appropriate mood and affect  A: 27 year old G1P1 status post cesarean delivery with postoperative pain, likely due to gas and incision.  P: Continue to monitor closely, consider CT scan with any worsening pain or changes in vital signs. She is aware to report dyspnea or increasing pain right away.  Marcelyn Bruins, CNM 02/28/2018  6:48 AM

## 2018-02-28 NOTE — Lactation Note (Signed)
This note was copied from a baby's chart. Lactation Consultation Note  Patient Name: Boy Cynthia Cogle Today's Date: 02/28/2018 Reason for consult: Initial assessment;1st time breastfeeding;NICU baby   Maternal Data    Feeding Feeding Type: Breast Fed Nipple Type: Slow - flow Length of feed: 7 min  LATCH Score Latch: Repeated attempts needed to sustain latch, nipple held in mouth throughout feeding, stimulation needed to elicit sucking reflex.  Audible Swallowing: A few with stimulation  Type of Nipple: Everted at rest and after stimulation  Comfort (Breast/Nipple): Filling, red/small blisters or bruises, mild/mod discomfort  Hold (Positioning): Assistance needed to correctly position infant at breast and maintain latch.  LATCH Score: 6  Interventions Interventions: Breast feeding basics reviewed;Assisted with latch;Hand express  Lactation Tools Discussed/Used     Consult Status Consult Status: Follow-up  Baby is still trying to get coordinated with suck swallow patterns, but is able to maintain latch after sandwiching mom's breast tissue. Spoke with parents about frequently emptying breasts and how the physiology of breastfeeding works as far as establishing milk supply is concerned. LC will f/u with next breastfeeding.   Burnadette Peter 02/28/2018, 12:21 PM

## 2018-02-28 NOTE — Progress Notes (Signed)
POD #1 CS for FITL Subjective:  Was a little lightheaded this AM before breakfast, but no further lightheadedness after eating breakfast. Having intermittent "gas pains". Has not passed flatus. Foley out this AM, has voided 150 ml. Has been to NICU to see son. Breast feeding.    Objective:  Blood pressure 137/72, pulse 72, temperature 98.8 F (37.1 C), temperature source Oral, resp. rate 18, height 5\' 6"  (1.676 m), weight 89.8 kg, last menstrual period 06/12/2017, SpO2 99 %, unknown if currently breastfeeding.  General: WF, uncomfortable when ambulating and moving, moving slow Pulmonary: no increased work of breathing/ CTAB Abdomen: soft but tympanic, appropriately tender, fundus firm at level of umbilicus-1FB Incision: Dressing over incision was draining a moderate amount of serosanguinous drainage. Pressure dressing applied and is still dry 3 hours later. ON Q dressing was also leaking serosanguinous drainage and that dressing was changed twice, adding more Dermabond at the base of the catheter with the last dressing change. Lochia: appropriate Extremities: no edema, no erythema, no tenderness  Results for orders placed or performed during the hospital encounter of 02/26/18 (from the past 72 hour(s))  CBC     Status: Abnormal   Collection Time: 02/26/18  8:21 AM  Result Value Ref Range   WBC 10.9 3.6 - 11.0 K/uL   RBC 3.84 3.80 - 5.20 MIL/uL   Hemoglobin 13.0 12.0 - 16.0 g/dL    Comment: RESULT REPEATED AND VERIFIED   HCT 35.2 35.0 - 47.0 %   MCV 91.6 80.0 - 100.0 fL   MCH 33.7 26.0 - 34.0 pg   MCHC 36.8 (H) 32.0 - 36.0 g/dL   RDW 16.1 09.6 - 04.5 %   Platelets 205 150 - 440 K/uL    Comment: Performed at [facility], [address] [place], [place] [address]  Type and screen [facility]     Status: None   Collection Time: 02/26/18  8:21 AM  Result Value Ref Range   ABO/RH(D) A POS    Antibody Screen NEG    Sample Expiration       03/01/2018 Performed at [facility], [address] [place], [place] [address]   RPR     Status: None   Collection Time: 02/26/18  8:21 AM  Result Value Ref Range   RPR Ser Ql Non Reactive Non Reactive    Comment: (NOTE) Performed At: [facility] [address] [place], [place] [id number] Jolene Schimke MD QM:[id number]   Protein / creatinine ratio, urine     Status: None   Collection Time: 02/27/18  2:07 PM  Result Value Ref Range   Creatinine, Urine 111 mg/dL   Total Protein, Urine 9 mg/dL    Comment: NO NORMAL RANGE ESTABLISHED FOR THIS TEST   Protein Creatinine Ratio 0.08 0.00 - 0.15 mg/mg[Cre]    Comment: Performed at [facility], [address] [place], [place] [address]  CBC     Status: Abnormal   Collection Time: 02/28/18  5:17 AM  Result Value Ref Range   WBC 12.4 (H) 3.6 - 11.0 K/uL   RBC 2.80 (L) 3.80 - 5.20 MIL/uL   Hemoglobin 9.2 (L) 12.0 - 16.0 g/dL   HCT 24.4 (L) 01.0 - 27.2 %   MCV 92.3 80.0 - 100.0 fL   MCH 32.9 26.0 - 34.0 pg   MCHC 35.7 32.0 - 36.0 g/dL   RDW 53.6 64.4 - 03.4 %   Platelets 202 150 - 440 K/uL  Comment: Performed at [facility], [address] [place], [place] [address]     Assessment:   27 y.o. G1P1001 postoperativeday # 1-stable  Continue to monitor bleeding from incision/ ON Q  Plan:  1) Acute blood loss anemia - hemodynamically stable and asymptomatic - po iron and vitamins  2)A POS/ VI/ RI  3) TDAP UTD 01/19/2018  4) Breast and bottle  5) Continue Gas X. Ambulate  Farrel Conners, CNM

## 2018-02-28 NOTE — Anesthesia Postprocedure Evaluation (Signed)
Anesthesia Post Note  Patient: Tiffany Conner  Procedure(s) Performed: CESAREAN SECTION (N/A )  Patient location during evaluation: Mother Baby Anesthesia Type: Epidural Level of consciousness: awake and alert Pain management: pain level controlled Vital Signs Assessment: post-procedure vital signs reviewed and stable Respiratory status: spontaneous breathing, nonlabored ventilation and respiratory function stable Cardiovascular status: stable Postop Assessment: no headache, no backache and epidural receding Anesthetic complications: no Comments: Epidural to c-section     Last Vitals:  Vitals:   02/28/18 0521 02/28/18 0825  BP: 115/62 137/72  Pulse: 90 72  Resp:  18  Temp:  36.8 C  SpO2: 99% 99%    Last Pain:  Vitals:   02/28/18 0830  TempSrc:   PainSc: 0-No pain                 Starling Manns

## 2018-02-28 NOTE — Lactation Note (Signed)
This note was copied from a baby's chart. Lactation Consultation Note  Patient Name: Boy Maytte Jacot [id number] Date: 02/28/2018 Reason for consult: Follow-up assessment;Primapara;Early term 37-38.6wks;NICU baby;Other (Comment)(Blood sugar issues)  Assisted mom with positioning with pillow support and nursing stool on left breast.  Hand expressed to entice him to latch. Attempted to put carson in cradle hold without success.  He kept fussing and then fell asleep at the breast.  Rotated him to the football hold and gave a couple of drops in corner of mouth.  He latched with minimal assistance and began strong rhythmic sucking with swallows requiring occasional stimulation and breast massage to keep him nutrively sucking at the breast.  He remained on the breast with vigorous sucking for 15 minutes before coming off satiated.  We placed him skin to skin on mom's chest.  Mom had expressed 5 ml so we gave the remainder of her pumped colostrum via curved tip syringe while finger feeding.  Mom plans to continue to come for feeding times and put to the breast and pump within an hour afterward to have some to offer for next feeding if needed. Maternal Data Formula Feeding for Exclusion: No Has patient been taught Hand Expression?: Yes Does the patient have breastfeeding experience prior to this delivery?: No  Feeding Feeding Type: Breast Fed Length of feed: 15 min(15 minutes at the breast & fingerfed 5 ml while he was skin to skin with mom after BF )  LATCH Score Latch: Repeated attempts needed to sustain latch, nipple held in mouth throughout feeding, stimulation needed to elicit sucking reflex.  Audible Swallowing: A few with stimulation  Type of Nipple: Everted at rest and after stimulation  Comfort (Breast/Nipple): Soft / non-tender  Hold (Positioning): Assistance needed to correctly position infant at breast and maintain latch.  LATCH Score: 7  Interventions Interventions: Breast feeding  basics reviewed;DEBP;Expressed milk;Assisted with latch;Breast compression;Skin to skin;Adjust position;Breast massage;Support pillows;Hand express;Position options  Lactation Tools Discussed/Used Tools: Pump Breast pump type: Double-Electric Breast Pump WIC Program: No([employer]) Pump Review: Setup, frequency, and cleaning;Milk Storage;Other (comment) Initiated by:: J.Carmellia Kreisler, IBCLC Date initiated:: 02/28/18   Consult Status Consult Status: Follow-up Follow-up type: Call as needed    Louis Meckel 02/28/2018, 6:31 PM

## 2018-02-28 NOTE — Progress Notes (Addendum)
Called to see patient by Farrel Conners, CNM, due to bleeding from incision site.  There was increased bleeding noted from the incision earlier today. A pressure dressing was applied and after removal a significant amount of blood was noted and it was reported that there was a "spurting" of blood coming from the incision.  On initial inspection there was rapid loss of medium-dark red blood coming from two sites along the incision line.  No matter how often this was wiped away, it continued to bleed.  The incision was otherwise intact.  This level of bleeding was far too brisk for continued observation and she had failed the pressure dressing.   I discussed with the patient my recommendation to partially open the wound to better assess the source of bleeding.  She consented to allow me to explore the wound.  After prepping the skin with betadine, about 8 mL of 1% plan lidocaine was used to infiltrate the skin.  The ski stitches were disrupted by scissors.  The wound was opened and copious dark-red blood came from the incision, both from superficial and deep areas of the incision. It was necessary to open the incision most of th way (2/3 of the incision opened).  The area was drenched in betadine and sterile gloves were worn. Sterile gauze was utilized to remove the collection of blood.  No active arterial bleeding was noted.  Only very mild venous oozing was noted.  4-0 vicryl was utilized to re-approximate the deepest portion of the left side of the incision.  No oozing was noted.  A running 3-0 chromic was utilized to close the subcutaneous tissue along the left-most 2/3 of the incision in two layers.  The free end of the skin suture at the now-opened right edge of the incision was affixed to 4-0 vicryl (no monocryl available) to secure the closure of the right-most aspect of the incision.  The free end of the suture was affixed to the 4-0 vicryl after anchoring the vicryl in the subcuticular space.  The  4-0 vicryl was then used to close the skin in a subcuticular fashion starting from the edge of the opened skin to the left -most apex.  Hemostasis was noted and the closure was re-approximated using surgical skin glue.  After the skin glue dried, a pressure dressing was applied to the incision line. The patient tolerated the procedure well.  All counts correct with two RNs present and Farrel Conners, CNM, assisting.   The patient will be given Ancef 2 grams in two different doses for infection prophylaxis as the would exploration and closure was extensive and while sterile technique was utilized, the environment was not entirely sterile.  Of note, the patient did have a hemoglobin drop of nearly 1 point since this morning.  Repeat CBC in AM.   Thomasene Mohair, MD, [facility], [facility] 02/28/2018 10:37 PM

## 2018-03-01 ENCOUNTER — Encounter: Payer: 59 | Admitting: Obstetrics and Gynecology

## 2018-03-01 ENCOUNTER — Other Ambulatory Visit: Payer: 59

## 2018-03-01 LAB — CBC
HCT: 20.5 % — ABNORMAL LOW (ref 35.0–47.0)
HEMOGLOBIN: 7.3 g/dL — AB (ref 12.0–16.0)
MCH: 33.6 pg (ref 26.0–34.0)
MCHC: 35.4 g/dL (ref 32.0–36.0)
MCV: 95 fL (ref 80.0–100.0)
Platelets: 167 10*3/uL (ref 150–440)
RBC: 2.16 MIL/uL — AB (ref 3.80–5.20)
RDW: 12.7 % (ref 11.5–14.5)
WBC: 7.8 10*3/uL (ref 3.6–11.0)

## 2018-03-01 LAB — SURGICAL PATHOLOGY

## 2018-03-01 MED ORDER — FERROUS FUMARATE 324 (106 FE) MG PO TABS
1.0000 | ORAL_TABLET | Freq: Two times a day (BID) | ORAL | Status: DC
  Administered 2018-03-01 – 2018-03-03 (×5): 106 mg via ORAL
  Filled 2018-03-01 (×5): qty 1

## 2018-03-01 NOTE — Progress Notes (Signed)
POD#2 pLTCS Subjective:  Ambulating in hallway, moving well. Denies dizziness and excessive fatigue. Able to tolerate small meals without nausea; having some gas pain but passing flatus. Voiding without difficulty. Pain noted at incision site, but pain control is adequate with medications.  Objective:  Blood pressure 138/72, pulse 90, temperature 98 F (36.7 C), temperature source Oral, resp. rate 20, height 5\' 6"  (1.676 m), weight 89.8 kg, last menstrual period 06/12/2017, SpO2 100 %, unknown if currently breastfeeding.  General: NAD Pulmonary: no increased work of breathing Abdomen: non-distended, non-tender, fundus firm, lochia appropriate Incision: examined by Dr. Tiburcio Pea, small amount of blood on dressing when removed. Left side redressed, right side with very scant amount of blood when manipulated and was left open to air.  On-Q dressing intact but appears to have some leakage and will be redressed by RN Extremities: no edema, no erythema, no tenderness  Results for orders placed or performed during the hospital encounter of 02/26/18 (from the past 72 hour(s))  Protein / creatinine ratio, urine     Status: None   Collection Time: 02/27/18  2:07 PM  Result Value Ref Range   Creatinine, Urine 111 mg/dL   Total Protein, Urine 9 mg/dL    Comment: NO NORMAL RANGE ESTABLISHED FOR THIS TEST   Protein Creatinine Ratio 0.08 0.00 - 0.15 mg/mg[Cre]    Comment: Performed at [facility], [address] [place], [place] [address]  CBC     Status: Abnormal   Collection Time: 02/28/18  5:17 AM  Result Value Ref Range   WBC 12.4 (H) 3.6 - 11.0 K/uL   RBC 2.80 (L) 3.80 - 5.20 MIL/uL   Hemoglobin 9.2 (L) 12.0 - 16.0 g/dL   HCT 60.4 (L) 54.0 - 98.1 %   MCV 92.3 80.0 - 100.0 fL   MCH 32.9 26.0 - 34.0 pg   MCHC 35.7 32.0 - 36.0 g/dL   RDW 19.1 47.8 - 29.5 %   Platelets 202 150 - 440 K/uL    Comment: Performed at [facility], [address] [place], [place] [address]  CBC      Status: Abnormal   Collection Time: 02/28/18  8:10 PM  Result Value Ref Range   WBC 10.0 3.6 - 11.0 K/uL   RBC 2.44 (L) 3.80 - 5.20 MIL/uL   Hemoglobin 8.3 (L) 12.0 - 16.0 g/dL   HCT 86.5 (L) 78.4 - 69.6 %   MCV 92.1 80.0 - 100.0 fL   MCH 33.9 26.0 - 34.0 pg   MCHC 36.9 (H) 32.0 - 36.0 g/dL   RDW 29.5 28.4 - 13.2 %   Platelets 208 150 - 440 K/uL    Comment: Performed at [facility], [address] [place], [place] [address]  CBC     Status: Abnormal   Collection Time: 03/01/18  5:01 AM  Result Value Ref Range   WBC 7.8 3.6 - 11.0 K/uL   RBC 2.16 (L) 3.80 - 5.20 MIL/uL   Hemoglobin 7.3 (L) 12.0 - 16.0 g/dL   HCT 27.2 (L) 53.6 - 64.4 %   MCV 95.0 80.0 - 100.0 fL   MCH 33.6 26.0 - 34.0 pg   MCHC 35.4 32.0 - 36.0 g/dL   RDW 03.4 74.2 - 59.5 %   Platelets 167 150 - 440 K/uL    Comment: Performed at [facility], [address] [place], [place] [address]     Assessment:   27 y.o. G1P1001 postoperative day # 2 with incision  revision yesterday, recovering well.  Plan:  1) Acute blood loss anemia - hemodynamically stable and asymptomatic - PO ferrous fumurate and vitamins  2) Blood Type --/--/A POS (09/28 4098) / Rubella 1.47 (03/08 1446) / Varicella Immune  3) TDAP status: received antepartum 01/19/2018  4) Breast and formula feeding  5) Disposition: continue postpartum care. Check CBC tomorrow morning.  Tiffany Conner, CNM 03/01/2018  10:22 AM

## 2018-03-02 LAB — CBC
HCT: 18.8 % — ABNORMAL LOW (ref 35.0–47.0)
HCT: 19.8 % — ABNORMAL LOW (ref 35.0–47.0)
Hemoglobin: 6.8 g/dL — ABNORMAL LOW (ref 12.0–16.0)
Hemoglobin: 7.2 g/dL — ABNORMAL LOW (ref 12.0–16.0)
MCH: 34.2 pg — AB (ref 26.0–34.0)
MCH: 34.6 pg — ABNORMAL HIGH (ref 26.0–34.0)
MCHC: 36.2 g/dL — AB (ref 32.0–36.0)
MCHC: 36.2 g/dL — AB (ref 32.0–36.0)
MCV: 94.6 fL (ref 80.0–100.0)
MCV: 95.5 fL (ref 80.0–100.0)
PLATELETS: 241 10*3/uL (ref 150–440)
Platelets: 184 10*3/uL (ref 150–440)
RBC: 1.99 MIL/uL — ABNORMAL LOW (ref 3.80–5.20)
RBC: 2.07 MIL/uL — AB (ref 3.80–5.20)
RDW: 13 % (ref 11.5–14.5)
RDW: 12.9 % (ref 11.5–14.5)
WBC: 7 10*3/uL (ref 3.6–11.0)
WBC: 7.4 10*3/uL (ref 3.6–11.0)

## 2018-03-02 NOTE — Progress Notes (Signed)
Subjective:  Doing well no concerns.  Ambulating without feeling dizzy or light head.  Tolerating po.    Objective:  Vital signs in last 24 hours: Temp:  [98 F (36.7 C)-99.2 F (37.3 C)] 98.8 F (37.1 C) (10/02 0325) Pulse Rate:  [90-96] 96 (10/02 0017) Resp:  [18-20] 18 (10/02 0017) BP: (137-151)/(68-78) 137/72 (10/02 0017) SpO2:  [100 %] 100 % (10/02 0017)    Intake/Output      10/01 0701 - 10/02 0700 10/02 0701 - 10/03 0700   P.O. 360    I.V. (mL/kg)     Total Intake(mL/kg) 360 (4)    Urine (mL/kg/hr)     Blood     Total Output     Net +360           General: NAD Pulmonary: no increased work of breathing Abdomen: non-distended, non-tender, fundus firm at level of umbilicus Incision: D/C/I, pressure dressing removed.  The ON-Q was leaking and was pulled as well. Extremities: no edema, no erythema, no tenderness  Results for orders placed or performed during the hospital encounter of 02/26/18 (from the past 72 hour(s))  Protein / creatinine ratio, urine     Status: None   Collection Time: 02/27/18  2:07 PM  Result Value Ref Range   Creatinine, Urine 111 mg/dL   Total Protein, Urine 9 mg/dL    Comment: NO NORMAL RANGE ESTABLISHED FOR THIS TEST   Protein Creatinine Ratio 0.08 0.00 - 0.15 mg/mg[Cre]    Comment: Performed at [facility], [address], [place], [place] [address]  Surgical pathology     Status: None   Collection Time: 02/27/18 10:53 PM  Result Value Ref Range   SURGICAL PATHOLOGY      Surgical Pathology CASE: [id number] PATIENT: Sherene Sires Surgical Pathology Report     SPECIMEN SUBMITTED: A. Placenta  CLINICAL HISTORY: 37 w 1 d  PRE-OPERATIVE DIAGNOSIS: Fetal intolerance to labor  POST-OPERATIVE DIAGNOSIS: Same as pre-op     DIAGNOSIS: A. PLACENTA; CESAREAN SECTION: - HYPER COILED THREE-VESSEL UMBILICAL CORD WITH THROMBOSIS OF ONE UMBILICAL ARTERY WITH VESSEL NECROSIS, UMBILICAL STROMAL HEMORRHAGE  AND HEMOSIDERIN LADEN MACROPHAGES. - SECONDARY AVASCULAR VILLI. - TERM DISCOID PLACENTA WEIGHING 471 G (APPROXIMATELY 45TH PERCENTILE FOR PROVIDED GESTATIONAL AGE). - FOCAL INTERVILLOUS FIBRIN DEPOSITION. - ECCENTRIC INSERTION OF UMBILICAL CORD. - FETAL MEMBRANES WITH CIRCUMMARGINATE INSERTION. - NEGATIVE FOR CHORIOAMNIONITIS.  Comment: The thrombosis of the umbilical artery does not appear acute and may explain the IUGR.  GROSS DESCRIPTION: A. Labeled: Placenta Received: In formalin Weight: 471 grams Size: 16.3 x 15.19  x 3.1 cm Shape: Ovoid Accessory lobes: 0 Membranes: Pink-tan translucent with approximately 20% circummarginate insertion of membranes Umbilical cord:      Length -34.1 cm in length x 0.9-1.1 cm in diameter      Number of vessels -3      Insertion -eccentric      Distance of insertion from margin -3.4 cm      Other findings -over coiled and discolored gray-yellow Fetal surface: Granular bluegray with prominent radiating vessels Maternal surface: Purple-tan with a central area of depression (1.5 x 1.1 cm comprising less than 5% of the parenchyma Comments: There are 2 central possible infarcts, 0.8 x 0.4 x 0.3 cm and 0.5 x 0.3 x 0.3 cm as well as a peripheral 0.9 x 0.9 x 0.6 cm infarct, all comprising no more than 5% of the parenchyma  Block summary: 1 - umbilical cord 2 - membrane rolls 3-4 -  placental parenchyma 5 - representative three infarcts    Final Diagnosis performed by Elijah Birk, MD.   Electronically signed 03/01/2018 1:48:40PM The electronic signa ture indicates that the named Attending Pathologist has evaluated the specimen  Technical component performed at [facility], [address], [place], [place] [address] Lab: [phone] Dir: Jolene Schimke, MD, MMM  Professional component performed at [facility], [address] [place], [place], [place] [address] Lab: [phone] Dir: Georgiann Cocker. Rubinas, MD   CBC     Status:  Abnormal   Collection Time: 02/28/18  5:17 AM  Result Value Ref Range   WBC 12.4 (H) 3.6 - 11.0 K/uL   RBC 2.80 (L) 3.80 - 5.20 MIL/uL   Hemoglobin 9.2 (L) 12.0 - 16.0 g/dL   HCT 02.7 (L) 25.3 - 66.4 %   MCV 92.3 80.0 - 100.0 fL   MCH 32.9 26.0 - 34.0 pg   MCHC 35.7 32.0 - 36.0 g/dL   RDW 40.3 47.4 - 25.9 %   Platelets 202 150 - 440 K/uL    Comment: Performed at [facility], [address] [place], [place] [address]  CBC     Status: Abnormal   Collection Time: 02/28/18  8:10 PM  Result Value Ref Range   WBC 10.0 3.6 - 11.0 K/uL   RBC 2.44 (L) 3.80 - 5.20 MIL/uL   Hemoglobin 8.3 (L) 12.0 - 16.0 g/dL   HCT 56.4 (L) 33.2 - 95.1 %   MCV 92.1 80.0 - 100.0 fL   MCH 33.9 26.0 - 34.0 pg   MCHC 36.9 (H) 32.0 - 36.0 g/dL   RDW 88.4 16.6 - 06.3 %   Platelets 208 150 - 440 K/uL    Comment: Performed at [facility], [address] [place], [place] [address]  CBC     Status: Abnormal   Collection Time: 03/01/18  5:01 AM  Result Value Ref Range   WBC 7.8 3.6 - 11.0 K/uL   RBC 2.16 (L) 3.80 - 5.20 MIL/uL   Hemoglobin 7.3 (L) 12.0 - 16.0 g/dL   HCT 09.3 (L) 23.5 - 57.3 %   MCV 95.0 80.0 - 100.0 fL   MCH 33.6 26.0 - 34.0 pg   MCHC 35.4 32.0 - 36.0 g/dL   RDW 22.0 25.4 - 27.0 %   Platelets 167 150 - 440 K/uL    Comment: Performed at [facility], [address] [place], [place] [address]  CBC     Status: Abnormal   Collection Time: 03/02/18  5:01 AM  Result Value Ref Range   WBC 7.0 3.6 - 11.0 K/uL   RBC 1.99 (L) 3.80 - 5.20 MIL/uL   Hemoglobin 6.8 (L) 12.0 - 16.0 g/dL   HCT 28.3 (L) 15.1 - 76.1 %   MCV 94.6 80.0 - 100.0 fL   MCH 34.2 (H) 26.0 - 34.0 pg   MCHC 36.2 (H) 32.0 - 36.0 g/dL   RDW 60.7 37.1 - 06.2 %   Platelets 184 150 - 440 K/uL    Comment: Performed at [facility], [address], [place], [place] [address]    Immunization History  Administered Date(s) Administered  . Td 02/03/2007  . Tdap 01/19/2018    Assessment:    27 y.o. G1P1001 postoperativeday #3 1LTCS for fetal intolerance of labor   Plan:  ) Acute blood loss anemia - hemodynamically stable and asymptomatic - po ferrous sulfate - repeat CBC at 1600  2) Blood Type --/--/A POS (09/28 4627) /  Rubella 1.47 (03/08 1446) / Varicella Immune  3) TDAP status up to date  4) Feeding plan breast  5)  CHTN - mild range to normotensive BP  6) Disposition anticipate discharge POD4   Vena Austria, MD, [facility], [facility] 03/02/2018, 7:30 AM

## 2018-03-02 NOTE — Lactation Note (Signed)
This note was copied from a baby's chart. Lactation Consultation Note  Patient Name: Tiffany Conner FAOZH'Y Date: 03/02/2018 Reason for consult: Follow-up assessment   RN Tiffany Conner and I spoke with mom prior to this feeding about feeding options with rationale for each choice. Since mom is still only expressing a couple mls of colostrum (likely due to excessive blood loss after delivery and low Hgb) and had no colostrum at all in shield after she said he had "nursed well" for over 5 minutes, we all feel like either breastfeed with the sns and nipple shield for approx 15 minutes or so and then pump, or just pump and bottle while baby is dealing with low blood sugars and jaundice and mom is dealing with anemia, exhaustion and not an adequate milk supply. For now, we all agree to refrain from just breastfeeding since he cannot sustain nutritive suckling without the incentive of volume and flow and risk getting tired before getting a good volume.   RN Tiffany Conner and mom set up sns and nipple shield correctly on right breast while baby in cradle hold skin to skin. We made sure there were no kinks in sns tubing and a good flow was evidenced by seeing bubbles in sns, which we did see most of the time. It looked like he took 15 ml before coming off breast for a burp, then the feeding by mom with bottle. Mom encouraged to rest between feeds. I also encouraged her to choose "pump and bottle feeds" instead of sns and pump if she ever feels overwhelmed or the plan not working well as main goal is feed baby and work on [employer] supply as she heals.    Maternal Data    Feeding Feeding Type: Breast Milk with Formula added Nipple Type: Slow - flow Length of feed: (15 minutes of active nursing with sns, then bottle fed)  LATCH Score Latch: Grasps breast easily, tongue down, lips flanged, rhythmical sucking.  Audible Swallowing: Spontaneous and intermittent(with sns only)  Type of Nipple: Everted at rest and after  stimulation  Comfort (Breast/Nipple): Soft / non-tender  Hold (Positioning): Assistance needed to correctly position infant at breast and maintain latch.  LATCH Score: 9  Interventions Interventions: Assisted with latch;Skin to skin;Support pillows;DEBP  Lactation Tools Discussed/Used Tools: Nipple Dorris Carnes;Supplemental Nutrition System Nipple shield size: 20 Breast pump type: Double-Electric Breast Pump(to pump 15 minutes after BF from one breast (R/T NS use)   Consult Status Consult Status: Follow-up Date: 03/03/18    Tiffany Conner 03/02/2018, 11:46 AM

## 2018-03-03 ENCOUNTER — Other Ambulatory Visit: Payer: 59

## 2018-03-03 LAB — CBC
HEMATOCRIT: 21.6 % — AB (ref 35.0–47.0)
HEMOGLOBIN: 7.7 g/dL — AB (ref 12.0–16.0)
MCH: 33.3 pg (ref 26.0–34.0)
MCHC: 35.6 g/dL (ref 32.0–36.0)
MCV: 93.7 fL (ref 80.0–100.0)
Platelets: 299 10*3/uL (ref 150–440)
RBC: 2.3 MIL/uL — ABNORMAL LOW (ref 3.80–5.20)
RDW: 12.8 % (ref 11.5–14.5)
WBC: 8.1 10*3/uL (ref 3.6–11.0)

## 2018-03-03 MED ORDER — FUSION 65-65-25-30 MG PO CAPS: 1.0000 | ORAL_CAPSULE | Freq: Every day | ORAL | 11 refills | Status: DC

## 2018-03-03 MED ORDER — OXYCODONE-ACETAMINOPHEN 5-325 MG PO TABS: 1.0000 | ORAL_TABLET | ORAL | 0 refills | Status: DC | PRN

## 2018-03-03 MED ORDER — IBUPROFEN 600 MG PO TABS: 600.0000 mg | ORAL_TABLET | Freq: Four times a day (QID) | ORAL | 0 refills | Status: DC | PRN

## 2018-03-03 NOTE — Discharge Instructions (Signed)
Home Care Instructions for Mom °ACTIVITY °· Gradually return to your regular activities. °· Let yourself rest. Nap while your baby sleeps. °· Avoid lifting anything that is heavier than 10 lb (4.5 kg) until your health care provider says it is okay. °· Avoid activities that take a lot of effort and energy (are strenuous) until approved by your health care provider. Walking at a slow-to-moderate pace is usually safe. °· If you had a cesarean delivery: °? Do not vacuum, climb stairs, or drive a car for 4-6 weeks. °? Have someone help you at home until you feel like you can do your usual activities yourself. °? Do exercises as told by your health care provider, if this applies. ° °VAGINAL BLEEDING °You may continue to bleed for 4-6 weeks after delivery. Over time, the amount of blood usually decreases and the color of the blood usually gets lighter. However, the flow of bright red blood may increase if you have been too active. If you need to use more than one pad in an hour because your pad gets soaked, or if you pass a large clot: °· Lie down. °· Raise your feet. °· Place a cold compress on your lower abdomen. °· Rest. °· Call your health care provider. ° °If you are breastfeeding, your period should return anytime between 8 weeks after delivery and the time that you stop breastfeeding. If you are not breastfeeding, your period should return 6-8 weeks after delivery. °PERINEAL CARE °The perineal area, or perineum, is the part of your body between your thighs. After delivery, this area needs special care. Follow these instructions as told by your health care provider. °· Take warm tub baths for 15-20 minutes. °· Use medicated pads and pain-relieving sprays and creams as told. °· Do not use tampons or douches until vaginal bleeding has stopped. °· Each time you go to the bathroom: °? Use a peri bottle. °? Change your pad. °? Use towelettes in place of toilet paper until your stitches have healed. °· Do Kegel exercises  every day. Kegel exercises help to maintain the muscles that support the vagina, bladder, and bowels. You can do these exercises while you are standing, sitting, or lying down. To do Kegel exercises: °? Tighten the muscles of your abdomen and the muscles that surround your birth canal. °? Hold for a few seconds. °? Relax. °? Repeat until you have done this 5 times in a row. °· To prevent hemorrhoids from developing or getting worse: °? Drink enough fluid to keep your urine clear or pale yellow. °? Avoid straining when having a bowel movement. °? Take over-the-counter medicines and stool softeners as told by your health care provider. ° °BREAST CARE °· Wear a tight-fitting bra. °· Avoid taking over-the-counter pain medicine for breast discomfort. °· Apply ice to the breasts to help with discomfort as needed: °? Put ice in a plastic bag. °? Place a towel between your skin and the bag. °? Leave the ice on for 20 minutes or as told by your health care provider. ° °NUTRITION °· Eat a well-balanced diet. °· Do not try to lose weight quickly by cutting back on calories. °· Take your prenatal vitamins until your postpartum checkup or until your health care provider tells you to stop. ° °POSTPARTUM DEPRESSION °You may find yourself crying for no apparent reason and unable to cope with all of the changes that come with having a newborn. This mood is called postpartum depression. Postpartum depression happens because your hormone   levels change after delivery. If you have postpartum depression, get support from your partner, friends, and family. If the depression does not go away on its own after several weeks, contact your health care provider. BREAST SELF-EXAM Do a breast self-exam each month, at the same time of the month. If you are breastfeeding, check your breasts just after a feeding, when your breasts are less full. If you are breastfeeding and your period has started, check your breasts on day 5, 6, or 7 of your  period. Report any lumps, bumps, or discharge to your health care provider. Know that breasts are normally lumpy if you are breastfeeding. This is temporary, and it is not a health risk. INTIMACY AND SEXUALITY Avoid sexual activity for at least 3-4 weeks after delivery or until the brownish-red vaginal flow is completely gone. If you want to avoid pregnancy, use some form of birth control. You can get pregnant after delivery, even if you have not had your period. SEEK MEDICAL CARE IF:  You feel unable to cope with the changes that a child brings to your life, and these feelings do not go away after several weeks.  You notice a lump, a bump, or discharge on your breast.  SEEK IMMEDIATE MEDICAL CARE IF:  Blood soaks your pad in 1 hour or less.  You have: ? Severe pain or cramping in your lower abdomen. ? A bad-smelling vaginal discharge. ? A fever that is not controlled by medicine. ? A fever, and an area of your breast is red and sore. ? Pain or redness in your calf. ? Sudden, severe chest pain. ? Shortness of breath. ? Painful or bloody urination. ? Problems with your vision.  You vomit for 12 hours or longer.  You develop a severe headache.  You have serious thoughts about hurting yourself, your child, or anyone else.  This information is not intended to replace advice given to you by your health care provider. Make sure you discuss any questions you have with your health care provider. Document Released: 05/15/2000 Document Revised: 10/24/2015 Document Reviewed: 11/19/2014 Elsevier Interactive Patient Education  2017 [employer] Cesarean Delivery, Care After Refer to this sheet in the next few weeks. These instructions provide you with information about caring for yourself after your procedure. Your health care provider may also give you more specific instructions. Your treatment has been planned according to current medical practices, but problems sometimes occur. Call your  health care provider if you have any problems or questions after your procedure. What can I expect after the procedure? After the procedure, it is common to have:  A small amount of blood or clear fluid coming from the incision.  Some redness, swelling, and pain in your incision area.  Some abdominal pain and soreness.  Vaginal bleeding (lochia).  Pelvic cramps.  Fatigue.  Follow these instructions at home: Incision care   Follow instructions from your health care provider about how to take care of your incision. Make sure you: ? Wash your hands with soap and water before you change your bandage (dressing). If soap and water are not available, use hand sanitizer. ? If you have a dressing, change it as told by your health care provider. ? Leave stitches (sutures), skin staples, skin glue, or adhesive strips in place. These skin closures may need to stay in place for 2 weeks or longer. If adhesive strip edges start to loosen and curl up, you may trim the loose edges. Do not remove adhesive strips  completely unless your health care provider tells you to do that.  Check your incision area every day for signs of infection. Check for: ? More redness, swelling, or pain. ? More fluid or blood. ? Warmth. ? Pus or a bad smell.  When you cough or sneeze, hug a pillow. This helps with pain and decreases the chance of your incision opening up (dehiscing). Do this until your incision heals. Medicines  Take over-the-counter and prescription medicines only as told by your health care provider.  If you were prescribed an antibiotic medicine, take it as told by your health care provider. Do not stop taking the antibiotic until it is finished. Driving  Do not drive or operate heavy machinery while taking prescription pain medicine. Lifestyle  Do not drink alcohol. This is especially important if you are breastfeeding or taking pain medicine.  Do not use tobacco products, including  cigarettes, chewing tobacco, or e-cigarettes. If you need help quitting, ask your health care provider. Tobacco can delay wound healing. Eating and drinking  Drink at least 8 eight-ounce glasses of water every day unless told not to by your health care provider. If you breastfeed, you may need to drink more water than this.  Eat high-fiber foods every day. These foods may help prevent or relieve constipation. High-fiber foods include: ? Whole grain cereals and breads. ? Brown rice. ? Beans. ? Fresh fruits and vegetables. Activity  Return to your normal activities as told by your health care provider. Ask your health care provider what activities are safe for you.  Rest as much as possible. Try to rest or take a nap while your baby is sleeping.  Do not lift anything that is heavier than your baby or 10 lb (4.5 kg) as told by your health care provider.  Ask your health care provider when you can engage in sexual activity. This may depend on your: ? Risk of infection. ? Healing rate. ? Comfort and desire to engage in sexual activity. Bathing  Do not take baths, swim, or use a hot tub until your health care provider approves. Ask your health care provider if you can take showers. You may only be allowed to take sponge baths until your incision heals. General instructions  Do not use tampons or douches until your health care provider approves.  Wear: ? Loose, comfortable clothing. ? A supportive and well-fitting bra.  Watch for any blood clots that may pass from your vagina. These may look like clumps of dark red, brown, or black discharge.  Keep your perineum clean and dry as told by your health care provider.  Wipe from front to back when you use the toilet.  If possible, have someone help you care for your baby and help with household activities for a few days after you leave the hospital.  Keep all follow-up visits for you and your baby as told by your health care provider.  This is important. Contact a health care provider if:  You have: ? Bad-smelling vaginal discharge. ? Difficulty urinating. ? Pain when urinating. ? A sudden increase or decrease in the frequency of your bowel movements. ? More redness, swelling, or pain around your incision. ? More fluid or blood coming from your incision. ? Pus or a bad smell coming from your incision. ? A fever. ? A rash. ? Little or no interest in activities you used to enjoy. ? Questions about caring for yourself or your baby. ? Nausea.  Your incision feels warm to  the touch.  Your breasts turn red or become painful or hard.  You feel unusually sad or worried.  You vomit.  You pass large blood clots from your vagina. If you pass a blood clot, save it to show to your health care provider. Do not flush blood clots down the toilet without showing your health care provider.  You urinate more than usual.  You are dizzy or light-headed.  You have not breastfed and have not had a menstrual period for 12 weeks after delivery.  You stopped breastfeeding and have not had a menstrual period for 12 weeks after stopping breastfeeding. Get help right away if:  You have: ? Pain that does not go away or get better with medicine. ? Chest pain. ? Difficulty breathing. ? Blurred vision or spots in your vision. ? Thoughts about hurting yourself or your baby. ? New pain in your abdomen or in one of your legs. ? A severe headache.  You faint.  You bleed from your vagina so much that you fill two sanitary pads in one hour. This information is not intended to replace advice given to you by your health care provider. Make sure you discuss any questions you have with your health care provider. Document Released: 02/07/2002 Document Revised: 06/20/2016 Document Reviewed: 04/22/2015 Elsevier Interactive Patient Education  [employer].

## 2018-03-03 NOTE — Progress Notes (Signed)
03/03/2018 11:51 PM  BP (!) 145/75 (BP Location: Right Arm)   Pulse 92   Temp 99 F (37.2 C) (Oral)   Resp 20   Ht 5\' 6"  (167.6 cm)   Wt 89.8 kg   LMP 06/12/2017   SpO2 100%   Breastfeeding? Unknown   BMI 31.96 kg/m  Patient discharged per MD orders. Discharge instructions reviewed with patient and patient verbalized understanding. Prescriptions discussed with patient and to be picked up at [facility]. Patient and spouse remained in room with infant who is still currently admitted under special care nursery service at this time.  Ron Parker, RN

## 2018-03-04 ENCOUNTER — Encounter: Payer: 59 | Admitting: Advanced Practice Midwife

## 2018-03-04 ENCOUNTER — Ambulatory Visit: Payer: Self-pay

## 2018-03-04 NOTE — Lactation Note (Signed)
This note was copied from a baby's chart. Lactation Consultation Note  Patient Name: Boy Ariann Khaimov Today's Date: 03/04/2018     Maternal Data    Feeding Feeding Type: Breast Fed Nipple Type: Slow - flow  LATCH Score                   Interventions    Lactation Tools Discussed/Used     Consult Status  LC spoke with mother about lactation questions before discharge. Mother states that she feels infant is doing better and breastfeeding better at the breast but is still requiring the use of the nipple shield. LC explained that infant may still need the support of the shield for now while at the breast. LC talked to mother about adequate milk transfer, frequency of pumping and wet and dirty diapers and signs that she can tell if infant has had enough to eat. Mother states that her milk supply has slowly increased. LC explained that due to blood loss during the delivery, her body is focused on healing and she may experience lower milk supply than normal. Infant has a Pediatric appointment on Monday and LC has scheduled a lactation consult for Thursday of next week. LC encouraged mother to continue pump after breastfeeding. Mother states that she has a Medela pump at home from her insurance. Mother denies additional questions or concerns. LC gave mother information on the breastfeeding support group here at [facility].    Arlyss Gandy 03/04/2018, 10:47 AM

## 2018-03-07 ENCOUNTER — Ambulatory Visit (INDEPENDENT_AMBULATORY_CARE_PROVIDER_SITE_OTHER): Payer: 59 | Admitting: Obstetrics and Gynecology

## 2018-03-07 ENCOUNTER — Encounter: Payer: Self-pay | Admitting: Obstetrics and Gynecology

## 2018-03-07 VITALS — BP 136/82 | Ht 66.0 in | Wt 183.0 lb

## 2018-03-07 DIAGNOSIS — Z98891 History of uterine scar from previous surgery: Secondary | ICD-10-CM

## 2018-03-07 DIAGNOSIS — K5901 Slow transit constipation: Secondary | ICD-10-CM

## 2018-03-07 MED ORDER — METOCLOPRAMIDE HCL 10 MG PO TABS: 10.0000 mg | ORAL_TABLET | Freq: Every day | ORAL | 2 refills | Status: DC | PRN

## 2018-03-08 ENCOUNTER — Ambulatory Visit: Payer: Self-pay

## 2018-03-08 ENCOUNTER — Encounter: Payer: 59 | Admitting: Obstetrics and Gynecology

## 2018-03-08 ENCOUNTER — Other Ambulatory Visit: Payer: 59

## 2018-03-08 NOTE — Lactation Note (Signed)
This note was copied from a baby's chart. Lactation Consultation Note  Patient Name: Tiffany Conner Today's Date: 03/08/2018     Maternal Data    Feeding    LATCH Score                   Interventions    Lactation Tools Discussed/Used     Consult Status  Colon Branch was seen today for an outpatient lactation appointment along with his parents today. Mother states that infant is breastfeeding well and had a great pediatric appointment yesterday weighing 5 lbs. Colon Branch is now 1 week and 1 day old. Mother is concerned about him still requiring the nipple shield. LC explained that Colon Branch may be on the shield for a while until he is able to get more proficient with breastfeeding and can be weaned off at that time when he grows more. Mother breast-fed Colon Branch using the shield on both sides. Pre and post weight was obtained and he took 30 mL. Post weight was 2410 grams (5 lbs 5 oz). Mother states that Colon Branch is breastfeeding every 1-1.5 hours, is not taking a bottle, is satisfied after feedings and is having sufficient wet and dirty diapers. LC explained that Colon Branch is taking in appropriate amounts for his age, gaining weight and is on track with where he needs to be. Mother states that she would like to check in a few weeks from now regarding help with weaning from the shield. LC gave mother contact information so she can call to set up a lactation appointment when the time comes. Parents verbalized understanding and deny additional questions or concerns.    Arlyss Gandy 03/08/2018, 2:19 PM

## 2018-03-11 ENCOUNTER — Encounter: Payer: 59 | Admitting: Advanced Practice Midwife

## 2018-03-16 ENCOUNTER — Encounter: Payer: Self-pay | Admitting: Obstetrics and Gynecology

## 2018-03-16 ENCOUNTER — Ambulatory Visit (INDEPENDENT_AMBULATORY_CARE_PROVIDER_SITE_OTHER): Payer: 59 | Admitting: Obstetrics and Gynecology

## 2018-03-16 VITALS — BP 122/54 | HR 98 | Ht 66.0 in | Wt 175.0 lb

## 2018-03-16 DIAGNOSIS — Z9889 Other specified postprocedural states: Secondary | ICD-10-CM

## 2018-03-16 DIAGNOSIS — T148XXA Other injury of unspecified body region, initial encounter: Principal | ICD-10-CM

## 2018-03-16 DIAGNOSIS — L089 Local infection of the skin and subcutaneous tissue, unspecified: Secondary | ICD-10-CM

## 2018-03-16 MED ORDER — SULFAMETHOXAZOLE-TRIMETHOPRIM 800-160 MG PO TABS: 1.0000 | ORAL_TABLET | Freq: Two times a day (BID) | ORAL | 0 refills | Status: AC

## 2018-03-17 NOTE — Progress Notes (Signed)
  OBSTETRICS POSTPARTUM CLINIC PROGRESS NOTE  Subjective:     Tiffany Conner is a 27 y.o. G31P1001 female who presents for a postpartum visit. She is 2 weeks postpartum following a Term pregnancy and delivery by C-section.  I have fully reviewed the prenatal and intrapartum course. Anesthesia: spinal.  Postpartum course has been complicated by complicated by anemia and a hematoma requiring incision revision.  Baby is feeding by Breast.  Bleeding: patient has not  resumed menses.  Bowel function is normal. Bladder function is normal.  Patient is not sexually active. Contraception method desired is undecided.    The following portions of the patient's history were reviewed and updated as appropriate: allergies, current medications, past family history, past medical history, past social history, past surgical history and problem list.  Review of Systems Pertinent items are noted in HPI.  Objective:    BP (!) 122/54   Pulse 98   Ht 5\' 6"  (1.676 m)   Wt 175 lb (79.4 kg)   Breastfeeding? Yes   BMI 28.25 kg/m   General:  alert and no distress   Breasts:  inspection negative, no nipple discharge or bleeding, no masses or nodularity palpable  Lungs: clear to auscultation bilaterally  Heart:  regular rate and rhythm, S1, S2 normal, no murmur, click, rub or gallop  Abdomen: soft, non-tender; bowel sounds normal; no masses,  no organomegaly.   Well healed Pfannenstiel incision. Small area of purulent tissue on right aspect of incision. No induration. No erythema of the skin. Hematoma and bruising of skin above the incision.   Vulva:  normal  Vagina: normal vagina, no discharge, exudate, lesion, or erythema  Cervix:  no cervical motion tenderness and no lesions  Corpus: normal size, contour, position, consistency, mobility, non-tender  Adnexa:  normal adnexa and no mass, fullness, tenderness  Rectal Exam: Not performed.          Assessment:  Post Partum Care visit 1. Wound infection  Discussed warning signs of worsening infection and how to properly care for incision.   - sulfamethoxazole-trimethoprim (BACTRIM DS,SEPTRA DS) 800-160 MG tablet; Take 1 tablet by mouth 2 (two) times daily for 10 days.  Dispense: 20 tablet; Refill: 0   Plan:  See orders and Patient Instructions  Discussed at length birth control options.   Follow up in: 4 weeks or as needed.   Adelene Idler MD [facility], [facility] 03/17/18 3:16 PM

## 2018-03-22 NOTE — Progress Notes (Signed)
  OBSTETRICS POSTPARTUM CLINIC PROGRESS NOTE  Subjective:     Tiffany Conner is a 27 y.o. G2P1001 female who presents for a postpartum visit. She is 1 week postpartum following a Term pregnancy and delivery by C-section.  I have fully reviewed the prenatal and intrapartum course. Anesthesia: epidural.  Postpartum course has been complicated by complicated by wound revision and hematoma.  Baby is feeding by Breast.  Bleeding: patient has not  resumed menses.  Bowel function is normal, but she has experienced constipation. Bladder function is normal.  Patient is not sexually active. Contraception method desired is none.  Postpartum depression screening: negative.  The following portions of the patient's history were reviewed and updated as appropriate: allergies, current medications, past family history, past medical history, past social history, past surgical history and problem list.  Review of Systems Pertinent items are noted in HPI.  Objective:    BP 136/82   Ht 5\' 6"  (1.676 m)   Wt 183 lb (83 kg)   LMP 06/12/2017   BMI 29.54 kg/m   General:  alert and no distress   Breasts:  inspection negative, no nipple discharge or bleeding, no masses or nodularity palpable  Lungs: clear to auscultation bilaterally  Heart:  regular rate and rhythm, S1, S2 normal, no murmur, click, rub or gallop  Abdomen: soft, non-tender; bowel sounds normal; no masses,  no organomegaly.   Well healed Pfannenstiel incision   Vulva:  normal  Vagina: normal vagina, no discharge, exudate, lesion, or erythema  Cervix:  no cervical motion tenderness and no lesions  Corpus: normal size, contour, position, consistency, mobility, non-tender  Adnexa:  normal adnexa and no mass, fullness, tenderness  Rectal Exam: Not performed.          Assessment:  Post Partum Care visit 1. Slow transit constipation - metoCLOPramide (REGLAN) 10 MG tablet; Take 1 tablet (10 mg total) by mouth daily as needed (constipation).   Dispense: 30 tablet; Refill: 2   Plan:  See orders and Patient Instructions  Counseled about contraceptive options.  Counseled about wound management and lifting precautions. Discussed constipation and diet, hydration, and use of OTC medications. Encouraged use of PO iron for anemia related to postpartum hemorrhage.                                                                                                                                                                              Follow up in: 1 week or as needed.   Adelene Idler MD [facility], [facility] 03/22/18 7:05 PM

## 2018-04-13 ENCOUNTER — Ambulatory Visit: Payer: 59 | Admitting: Obstetrics and Gynecology

## 2018-04-21 ENCOUNTER — Ambulatory Visit (INDEPENDENT_AMBULATORY_CARE_PROVIDER_SITE_OTHER): Payer: 59 | Admitting: Obstetrics and Gynecology

## 2018-04-21 ENCOUNTER — Encounter: Payer: Self-pay | Admitting: Obstetrics and Gynecology

## 2018-04-21 VITALS — BP 130/70 | Ht 66.0 in | Wt 186.0 lb

## 2018-04-21 DIAGNOSIS — Z3202 Encounter for pregnancy test, result negative: Secondary | ICD-10-CM

## 2018-04-21 DIAGNOSIS — Z30011 Encounter for initial prescription of contraceptive pills: Secondary | ICD-10-CM

## 2018-04-21 DIAGNOSIS — N912 Amenorrhea, unspecified: Secondary | ICD-10-CM

## 2018-04-21 LAB — POCT URINE PREGNANCY: PREG TEST UR: NEGATIVE

## 2018-04-21 MED ORDER — NORETHINDRONE 0.35 MG PO TABS: 1.0000 | ORAL_TABLET | Freq: Every day | ORAL | 11 refills | Status: DC

## 2018-04-21 NOTE — Progress Notes (Signed)
Negative, Released to mychart 

## 2018-04-21 NOTE — Progress Notes (Signed)
  OBSTETRICS POSTPARTUM CLINIC PROGRESS NOTE  Subjective:     Tiffany Conner is a 27 y.o. 491P1001 female who presents for a postpartum visit. She is 7 weeks postpartum following a Term pregnancy and delivery by C-section.  I have fully reviewed the prenatal and intrapartum course. Anesthesia: epidural.  Postpartum course has been complicated by uncomplicated.  Baby is feeding by Breast.  Bleeding: patient has not  resumed menses.  Bowel function is normal. Bladder function is normal.  Patient is sexually active. Contraception method desired is oral progesterone-only contraceptive.  Postpartum depression screening: negative.   The following portions of the patient's history were reviewed and updated as appropriate: allergies, current medications, past family history, past medical history, past social history, past surgical history and problem list.  Review of Systems Pertinent items are noted in HPI.  Objective:    BP 130/70   Ht 5\' 6"  (1.676 m)   Wt 186 lb (84.4 kg)   Breastfeeding? Yes   BMI 30.02 kg/m   General:  alert and no distress   Breasts:  inspection negative, no nipple discharge or bleeding, no masses or nodularity palpable  Lungs: clear to auscultation bilaterally  Heart:  regular rate and rhythm, S1, S2 normal, no murmur, click, rub or gallop  Abdomen: soft, non-tender; bowel sounds normal; no masses,  no organomegaly.   Well healed Pfannenstiel incision   Vulva:  normal  Vagina: normal vagina, no discharge, exudate, lesion, or erythema  Cervix:  no cervical motion tenderness and no lesions  Corpus: normal size, contour, position, consistency, mobility, non-tender  Adnexa:  normal adnexa and no mass, fullness, tenderness  Rectal Exam: Not performed.          Assessment:  Post Partum Care visit There are no diagnoses linked to this encounter.  Plan:  See orders and Patient Instructions Contraceptive counseling for oral progesterone-only contraceptive    Pregnancy test was negative today in office.  Resume all normal activities Follow up in: 1 year or as needed.   Tiffany Idlerhristanna Kritika Stukes MD [facility], [facility] 04/21/18 11:02 AM

## 2018-05-04 ENCOUNTER — Telehealth: Payer: Self-pay

## 2018-05-04 NOTE — Telephone Encounter (Signed)
Pt recently had c/s.  In order for her to get 8wks paid leave [employer] needs additional information faxed to [phone].  They need medical records from delivery, 6wk pp visit, and any restrictions she has.  Pt's cb# [phone].

## 2018-05-04 NOTE — Telephone Encounter (Signed)
Do I need to have her sign anything or should it be fine?

## 2018-05-05 NOTE — Telephone Encounter (Signed)
Should be fine to send what they need. She don't have to sign anything.

## 2018-05-06 NOTE — Telephone Encounter (Signed)
I have faxed the requested information to the given number

## 2018-05-10 NOTE — Telephone Encounter (Signed)
Pt calling again (called last Thurs) stating [employer] needs more information so she can possibly get pain for 8w instead of 6w.  They need medical records from delivery, 6wpp visit and any restrictions/limitations pt has.  Please call pt.  [phone]

## 2018-05-10 NOTE — Telephone Encounter (Signed)
Called pt and told her I had faxed the requested information on Friday of last week. I told pt if they have not received anything to please give me a call back and I will gladly refax the paperwork again.

## 2018-05-23 NOTE — Telephone Encounter (Signed)
She can return to work at 10 weeks

## 2018-05-23 NOTE — Telephone Encounter (Signed)
Pt needs a doctors note stating she has 10 weeks from her delivery date to be out of work. I will type up the note,however it will need to be signed by Dr.schuman on Friday. I am not here that day and am being covered by [employer] I will let her know to look out for a note from [place] [facility].

## 2018-05-23 NOTE — Telephone Encounter (Signed)
Pt calling to see what her return to work time is.  Is she returning at 8wks or 10wks pp?  [facility] needs to know so she will get pain.  [phone]

## 2018-05-23 NOTE — Telephone Encounter (Signed)
Please advise on what you think is best. Thank you!

## 2018-05-27 NOTE — Telephone Encounter (Signed)
Dr. Jerene PitchSchuman has signed letter and I have faxed it already.

## 2018-06-06 ENCOUNTER — Telehealth: Payer: Self-pay

## 2018-06-06 NOTE — Telephone Encounter (Signed)
Pt needs her medical records sent to her Disability Company after her C-Section. They have the 29th surgery (Date of C-Section) but not records from the 30th where they had to go back in for complications. Fax#[phone]

## 2018-06-08 NOTE — Telephone Encounter (Signed)
Notes have been faxed to appropriate people.

## 2018-06-09 NOTE — Telephone Encounter (Signed)
Left vm with pt to call me if there are any issues and if the papers need to be refaxed

## 2019-04-05 ENCOUNTER — Other Ambulatory Visit: Payer: Self-pay

## 2019-04-05 DIAGNOSIS — Z20822 Contact with and (suspected) exposure to covid-19: Secondary | ICD-10-CM

## 2019-04-06 LAB — NOVEL CORONAVIRUS, NAA: SARS-CoV-2, NAA: NOT DETECTED

## 2019-04-10 ENCOUNTER — Other Ambulatory Visit: Payer: Self-pay

## 2019-04-10 ENCOUNTER — Other Ambulatory Visit: Payer: Self-pay | Admitting: Obstetrics and Gynecology

## 2019-04-10 DIAGNOSIS — Z30011 Encounter for initial prescription of contraceptive pills: Secondary | ICD-10-CM

## 2019-04-10 MED ORDER — NORETHINDRONE 0.35 MG PO TABS: 1.0000 | ORAL_TABLET | Freq: Every day | ORAL | 0 refills | Status: DC

## 2019-04-10 NOTE — Telephone Encounter (Signed)
Pt calling for refill of bcp.  [phone] Pt aware refill eRx'd.

## 2019-04-24 ENCOUNTER — Ambulatory Visit (INDEPENDENT_AMBULATORY_CARE_PROVIDER_SITE_OTHER): Payer: BLUE CROSS/BLUE SHIELD | Admitting: Obstetrics and Gynecology

## 2019-04-24 ENCOUNTER — Other Ambulatory Visit: Payer: Self-pay

## 2019-04-24 ENCOUNTER — Encounter: Payer: Self-pay | Admitting: Obstetrics and Gynecology

## 2019-04-24 VITALS — BP 130/70 | Ht 66.0 in | Wt 168.0 lb

## 2019-04-24 DIAGNOSIS — Z0000 Encounter for general adult medical examination without abnormal findings: Secondary | ICD-10-CM

## 2019-04-24 DIAGNOSIS — Z30019 Encounter for initial prescription of contraceptives, unspecified: Secondary | ICD-10-CM

## 2019-04-24 DIAGNOSIS — Z01419 Encounter for gynecological examination (general) (routine) without abnormal findings: Secondary | ICD-10-CM

## 2019-04-24 DIAGNOSIS — Z130 Encounter for screening for diseases of the blood and blood-forming organs and certain disorders involving the immune mechanism: Secondary | ICD-10-CM

## 2019-04-24 MED ORDER — LEVONORGEST-ETH ESTRAD 91-DAY 0.15-0.03 MG PO TABS: 1.0000 | ORAL_TABLET | Freq: Every day | ORAL | 4 refills | Status: DC

## 2019-04-24 NOTE — Progress Notes (Signed)
Gynecology Annual Exam   PCP: Lucille Passy, MD  Chief Complaint:  Chief Complaint  Patient presents with  . Gynecologic Exam    History of Present Illness: Patient is a 28 y.o. G1P1001 presents for annual exam. The patient has no complaints today.   LMP: No LMP recorded. Average Interval: irregular. She has not had a period since her delivery. She recently stopped breast feeding in the last 3 weeks. She is taking a POP.  Duration of flow: 0 days Heavy Menses: no Clots: no Intermenstrual Bleeding: no Postcoital Bleeding: no Dysmenorrhea: no  The patient is sexually active. She currently uses oral progesterone-only contraceptive for contraception. She denies dyspareunia.  The patient does perform self breast exams.  There is notable family history of breast or ovarian cancer in her family.  The patient wears seatbelts: yes.   The patient has regular exercise: yes.    The patient denies current symptoms of depression.    Review of Systems: ROS  Past Medical History:  Past Medical History:  Diagnosis Date  . Heart murmur   . VSD (ventricular septal defect)     Past Surgical History:  Past Surgical History:  Procedure Laterality Date  . CESAREAN SECTION N/A 02/27/2018   Procedure: CESAREAN SECTION;  Surgeon: Malachy Mood, MD;  Location: [facility] ORS;  Service: Obstetrics;  Laterality: N/A;  . MYRINGOTOMY    . TIBIA FRACTURE SURGERY      Gynecologic History:  No LMP recorded. Contraception: oral progesterone-only contraceptive Last Pap: Results were: NIL 2018  Obstetric History: G1P1001  Family History:  Family History  Problem Relation Age of Onset  . Ovarian cancer Other     Social History:  Social History   Socioeconomic History  . Marital status: Married    Spouse name: Quita Skye  . Number of children: Not on file  . Years of education: 62  . Highest education level: Not on file  Occupational History  . 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 Financial resource strain: Not on file  . Food insecurity    Worry: Not on file    Inability: Not on file  . Transportation needs    Medical: Not on file    Non-medical: Not on file  Tobacco Use  . Smoking status: Never Smoker  . Smokeless tobacco: Never Used  Substance and Sexual Activity  . Alcohol use: No    Frequency: Never  . Drug use: No  . Sexual activity: Yes    Birth control/protection: Pill  Lifestyle  . Physical activity    Days per week: Not on file    Minutes per session: Not on file  . Stress: Not on file  Relationships  . Social Herbalist on phone: Not on file    Gets together: Not on file    Attends religious service: Not on file    Active member of club or organization: Not on file    Attends meetings of clubs or organizations: Not on file    Relationship status: Not on file  . Intimate partner violence    Fear of current or ex partner: Not on file    Emotionally abused: Not on file    Physically abused: Not on file    Forced sexual activity: Not on file  Other Topics Concern  . Not on file  Social History Narrative   Married.   Works as a Warehouse manager for [employer]    Graduate  of [employer].   Enjoys reading exercise.     Allergies:  No Known Allergies  Medications: Prior to Admission medications   Medication Sig Start Date End Date Taking? Authorizing Provider  norethindrone (MICRONOR) 0.35 MG tablet TAKE 1 TABLET BY MOUTH EVERY DAY 04/10/19  Yes Elijan Googe, Jaquelyn Bitter, MD    Physical Exam Vitals: Blood pressure 130/70, height 5\' 6"  (1.676 m), weight 168 lb (76.2 kg), not currently breastfeeding.  General: NAD HEENT: normocephalic, anicteric Thyroid: no enlargement, no palpable nodules Pulmonary: No increased work of breathing, CTAB Cardiovascular: RRR, distal pulses 2+ Breast: Breast symmetrical, no tenderness, palpable nodules  At 6 o'clock under the nipple. Patient reports this is new since she stopped breast feeding this  month. No skin or nipple retraction present, no nipple discharge.  No axillary or supraclavicular lymphadenopathy. Abdomen: NABS, soft, non-tender, non-distended.  Umbilicus without lesions.  No hepatomegaly, splenomegaly or masses palpable. No evidence of hernia  Genitourinary:  External: Normal external female genitalia.  Normal urethral meatus, normal Bartholin's and Skene's glands.    Vagina: Normal vaginal mucosa, no evidence of prolapse.    Cervix: Grossly normal in appearance, no bleeding  Uterus: Non-enlarged, mobile, normal contour.  No CMT  Adnexa: ovaries non-enlarged, no adnexal masses  Rectal: deferred  Lymphatic: no evidence of inguinal lymphadenopathy Extremities: no edema, erythema, or tenderness Neurologic: Grossly intact Psychiatric: mood appropriate, affect full  Female chaperone present for pelvic and breast  portions of the physical exam    Assessment: 28 y.o. G1P1001 routine annual exam  Plan: Problem List Items Addressed This Visit    None    Visit Diagnoses    Screening for deficiency anemia    -  Primary   Relevant Orders   CBC   Healthcare maintenance       Encounter for female birth control       Relevant Medications   levonorgestrel-ethinyl estradiol (SEASONALE) 0.15-0.03 MG tablet      2) STI screening  was offered and declined.  2)  ASCCP guidelines and rational discussed.  Patient opts for every 3 years screening interval  3) Contraception - the patient is currently using  oral progesterone-only contraceptive.  She is interested in changing to an extended cycle OCP  4) Routine healthcare maintenance including cholesterol, diabetes screening discussed managed by PCP  5) Breast lump, likely milk duct given recent breastfeeding status, recommended cold compresses, if no improvement in 2-3 weeks will call and can have 12-07-1996.  6) CBC today for history of anemia- discussed iron rich foods.   6)  Return in about 1 year (around 04/23/2020) for  annual.  04/25/2020 MD [facility] OB[facility]N, [facility] 04/24/2019 3:59 PM

## 2019-04-24 NOTE — Patient Instructions (Signed)
Institute of [place] for Calcium and Vitamin D  Age (yr) Calcium Recommended Dietary Allowance (mg/day) Vitamin D Recommended Dietary Allowance (international units/day)  9-18 1,300 600  19-50 1,000 600  51-70 1,200 600  71 and older 1,200 800  Data from Institute of Medicine. Dietary reference intakes: calcium, vitamin D. [place], [place]: [employer]; 2011.     Iron-Rich Diet  Iron is a mineral that helps your body to produce hemoglobin. Hemoglobin is a protein in red blood cells that carries oxygen to your body's tissues. Eating too little iron may cause you to feel weak and tired, and it can increase your risk of infection. Iron is naturally found in many foods, and many foods have iron added to them (iron-fortified foods). You may need to follow an iron-rich diet if you do not have enough iron in your body due to certain medical conditions. The amount of iron that you need each day depends on your age, your sex, and any medical conditions you have. Follow instructions from your health care provider or a diet and nutrition specialist (dietitian) about how much iron you should eat each day. What are tips for following this plan? Reading food labels  Check food labels to see how many milligrams (mg) of iron are in each serving. Cooking  Cook foods in pots and pans that are made from iron.  Take these steps to make it easier for your body to absorb iron from certain foods: ? Soak beans overnight before cooking. ? Soak whole grains overnight and drain them before using. ? Ferment flours before baking, such as by using yeast in bread dough. Meal planning  When you eat foods that contain iron, you should eat them with foods that are high in vitamin C. These include oranges, peppers, tomatoes, potatoes, and mango. Vitamin C helps your body to absorb iron. General information  Take iron supplements only as told by your health care  provider. An overdose of iron can be life-threatening. If you were prescribed iron supplements, take them with orange juice or a vitamin C supplement.  When you eat iron-fortified foods or take an iron supplement, you should also eat foods that naturally contain iron, such as meat, poultry, and fish. Eating naturally iron-rich foods helps your body to absorb the iron that is added to other foods or contained in a supplement.  Certain foods and drinks prevent your body from absorbing iron properly. Avoid eating these foods in the same meal as iron-rich foods or with iron supplements. These foods include: ? Coffee, black tea, and red wine. ? Milk, dairy products, and foods that are high in calcium. ? Beans and soybeans. ? Whole grains. What foods should I eat? Fruits Prunes. Raisins. Eat fruits high in vitamin C, such as oranges, grapefruits, and strawberries, alongside iron-rich foods. Vegetables Spinach (cooked). Green peas. Broccoli. Fermented vegetables. Eat vegetables high in vitamin C, such as leafy greens, potatoes, bell peppers, and tomatoes, alongside iron-rich foods. Grains Iron-fortified breakfast cereal. Iron-fortified whole-wheat bread. Enriched rice. Sprouted grains. Meats and other proteins Beef liver. Oysters. Beef. Shrimp. [place]. Chicken. [place]. Sardines. Chickpeas. Nuts. Tofu. Pumpkin seeds. Beverages Tomato juice. Fresh orange juice. Prune juice. Hibiscus tea. Fortified instant breakfast shakes. Sweets and desserts Blackstrap molasses. Seasonings and condiments Tahini. Fermented soy sauce. Other foods Wheat germ. The items listed above may not be a complete list of recommended foods and beverages. Contact a dietitian for more information. What foods should I avoid? Grains Whole grains.  Bran cereal. Bran flour. Oats. Meats and other proteins Soybeans. Products made from soy protein. Black beans. Lentils. Mung beans. Split peas. Dairy Milk. Cream. Cheese. Yogurt.  Cottage cheese. Beverages Coffee. Black tea. Red wine. Sweets and desserts Cocoa. Chocolate. Ice cream. Other foods Basil. Oregano. Large amounts of parsley. The items listed above may not be a complete list of foods and beverages to avoid. Contact a dietitian for more information. Summary  Iron is a mineral that helps your body to produce hemoglobin. Hemoglobin is a protein in red blood cells that carries oxygen to your body's tissues.  Iron is naturally found in many foods, and many foods have iron added to them (iron-fortified foods).  When you eat foods that contain iron, you should eat them with foods that are high in vitamin C. Vitamin C helps your body to absorb iron.  Certain foods and drinks prevent your body from absorbing iron properly, such as whole grains and dairy products. You should avoid eating these foods in the same meal as iron-rich foods or with iron supplements. This information is not intended to replace advice given to you by your health care provider. Make sure you discuss any questions you have with your health care provider. Document Released: 12/30/2004 Document Revised: 04/30/2017 Document Reviewed: 04/13/2017 Elsevier Patient Education  2020 [employer].  Exercising to Stay Healthy To become healthy and stay healthy, it is recommended that you do moderate-intensity and vigorous-intensity exercise. You can tell that you are exercising at a moderate intensity if your heart starts beating faster and you start breathing faster but can still hold a conversation. You can tell that you are exercising at a vigorous intensity if you are breathing much harder and faster and cannot hold a conversation while exercising. Exercising regularly is important. It has many health benefits, such as:  Improving overall fitness, flexibility, and endurance.  Increasing bone density.  Helping with weight control.  Decreasing body fat.  Increasing muscle strength.  Reducing  stress and tension.  Improving overall health. How often should I exercise? Choose an activity that you enjoy, and set realistic goals. Your health care provider can help you make an activity plan that works for you. Exercise regularly as told by your health care provider. This may include:  Doing strength training two times a week, such as: ? Lifting weights. ? Using resistance bands. ? Push-ups. ? Sit-ups. ? Yoga.  Doing a certain intensity of exercise for a given amount of time. Choose from these options: ? A total of 150 minutes of moderate-intensity exercise every week. ? A total of 75 minutes of vigorous-intensity exercise every week. ? A mix of moderate-intensity and vigorous-intensity exercise every week. Children, pregnant women, people who have not exercised regularly, people who are overweight, and older adults may need to talk with a health care provider about what activities are safe to do. If you have a medical condition, be sure to talk with your health care provider before you start a new exercise program. What are some exercise ideas? Moderate-intensity exercise ideas include:  Walking 1 mile (1.6 km) in about 15 minutes.  Biking.  Hiking.  Golfing.  Dancing.  Water aerobics. Vigorous-intensity exercise ideas include:  Walking 4.5 miles (7.2 km) or more in about 1 hour.  Jogging or running 5 miles (8 km) in about 1 hour.  Biking 10 miles (16.1 km) or more in about 1 hour.  Lap swimming.  Roller-skating or in-line skating.  Cross-country skiing.  Vigorous competitive  sports, such as football, basketball, and soccer.  Jumping rope.  Aerobic dancing. What are some everyday activities that can help me to get exercise?  Yard work, such as: ? Pushing a Surveyor, mininglawn mower. ? Raking and bagging leaves.  Washing your car.  Pushing a stroller.  Shoveling snow.  Gardening.  Washing windows or floors. How can I be more active in my day-to-day  activities?  Use stairs instead of an elevator.  Take a walk during your lunch break.  If you drive, park your car farther away from your work or school.  If you take public transportation, get off one stop early and walk the rest of the way.  Stand up or walk around during all of your indoor phone calls.  Get up, stretch, and walk around every 30 minutes throughout the day.  Enjoy exercise with a friend. Support to continue exercising will help you keep a regular routine of activity. What guidelines can I follow while exercising?  Before you start a new exercise program, talk with your health care provider.  Do not exercise so much that you hurt yourself, feel dizzy, or get very short of breath.  Wear comfortable clothes and wear shoes with good support.  Drink plenty of water while you exercise to prevent dehydration or heat stroke.  Work out until your breathing and your heartbeat get faster. Where to find more information  U.S. Department of Health and Human Services: ThisPath.fiwww.hhs.gov  Centers for Disease Control and Prevention (CDC): FootballExhibition.com.brwww.cdc.gov Summary  Exercising regularly is important. It will improve your overall fitness, flexibility, and endurance.  Regular exercise also will improve your overall health. It can help you control your weight, reduce stress, and improve your bone density.  Do not exercise so much that you hurt yourself, feel dizzy, or get very short of breath.  Before you start a new exercise program, talk with your health care provider. This information is not intended to replace advice given to you by your health care provider. Make sure you discuss any questions you have with your health care provider. Document Released: 06/20/2010 Document Revised: 04/30/2017 Document Reviewed: 04/08/2017 Elsevier Patient Education  2020 [employer]  Budget-Friendly Healthy Eating There are many ways to save money at the grocery store and continue to eat healthy.  You can be successful if you:  Plan meals according to your budget.  Make a grocery list and only purchase food according to your grocery list.  Prepare food yourself. What are tips for following this plan?  Reading food labels  Compare food labels between brand name foods and the store brand. Often the nutritional value is the same, but the store brand is lower cost.  Look for products that do not have added sugar, fat, or salt (sodium). These often cost the same but are healthier for you. Products may be labeled as: ? Sugar-free. ? Nonfat. ? Low-fat. ? Sodium-free. ? Low-sodium.  Look for lean ground beef labeled as at least 92% lean and 8% fat. Shopping  Buy only the items on your grocery list and go only to the areas of the store that have the items on your list.  Use coupons only for foods and brands you normally buy. Avoid buying items you wouldn't normally buy simply because they are on sale.  Check online and in newspapers for weekly deals.  Buy healthy items from the bulk bins when available, such as herbs, spices, flour, pasta, nuts, and dried fruit.  Buy fruits and vegetables  that are in season. Prices are usually lower on in-season produce.  Look at the unit price on the price tag. Use it to compare different brands and sizes to find out which item is the best deal.  Choose healthy items that are often low-cost, such as carrots, potatoes, apples, bananas, and oranges. Dried or canned beans are a low-cost protein source.  Buy in bulk and freeze extra food. Items you can buy in bulk include meats, fish, poultry, frozen fruits, and frozen vegetables.  Avoid buying "ready-to-eat" foods, such as pre-cut fruits and vegetables and pre-made salads.  If possible, shop around to discover where you can find the best prices. Consider other retailers such as dollar stores, larger [employer], local fruit and vegetable stands, and farmers markets.  Do not shop when you  are hungry. If you shop while hungry, it may be hard to stick to your list and budget.  Resist impulse buying. Use your grocery list as your official plan for the week.  Buy a variety of vegetables and fruits by purchasing fresh, frozen, and canned items.  Look at the top and bottom shelves for deals. Foods at eye level (eye level of an adult or child) are usually more expensive.  Be efficient with your time when shopping. The more time you spend at the store, the more money you are likely to spend.  To save money when choosing more expensive foods like meats and dairy: ? Choose cheaper cuts of meat, such as bone-in chicken thighs and drumsticks instead of skinless and boneless chicken. When you are ready to prepare the chicken, you can remove the skin yourself to make it healthier. ? Choose lean meats like chicken or [place] instead of beef. ? Choose canned seafood, such as tuna, salmon, or sardines. ? Buy eggs as a low-cost source of protein. ? Buy dried beans and peas, such as lentils, split peas, or kidney beans instead of meats. Dried beans and peas are a good alternative source of protein. ? Buy the larger tubs of yogurt instead of individual-sized containers.  Choose water instead of sodas and other sweetened beverages.  Avoid buying chips, cookies, and other "junk food." These items are usually expensive and not healthy. Cooking  Make extra food and freeze the extras in meal-sized containers or in individual portions for fast meals and snacks.  Pre-cook on days when you have extra time to prepare meals in advance. You can keep these meals in the fridge or freezer and reheat for a quick meal.  When you come home from the grocery store, wash, peel, and cut fruits and vegetables so they are ready to use and eat. This will help reduce food waste. Meal planning  Do not eat out or get fast food. Prepare food at home.  Make a grocery list and make sure to bring it with you to the  store. If you have a smart phone, you could use your phone to create your shopping list.  Plan meals and snacks according to a grocery list and budget you create.  Use leftovers in your meal plan for the week.  Look for recipes where you can cook once and make enough food for two meals.  Include budget-friendly meals like stews, casseroles, and stir-fry dishes.  Try some meatless meals or try "no cook" meals like salads.  Make sure that half your plate is filled with fruits or vegetables. Choose from fresh, frozen, or canned fruits and vegetables. If eating canned, remember to  rinse them before eating. This will remove any excess salt added for packaging. Summary  Eating healthy on a budget is possible if you plan your meals according to your budget, purchase according to your budget and grocery list, and prepare food yourself.  Tips for buying more food on a limited budget include buying generic brands, using coupons only for foods you normally buy, and buying healthy items from the bulk bins when available.  Tips for buying cheaper food to replace expensive food include choosing cheaper, lean cuts of meat, and buying dried beans and peas. This information is not intended to replace advice given to you by your health care provider. Make sure you discuss any questions you have with your health care provider. Document Released: 01/19/2014 Document Revised: 05/19/2017 Document Reviewed: 05/19/2017 Elsevier Patient Education  2020 [employer].

## 2019-04-25 LAB — CBC
Hematocrit: 41.6 % (ref 34.0–46.6)
Hemoglobin: 13.8 g/dL (ref 11.1–15.9)
MCH: 30.2 pg (ref 26.6–33.0)
MCHC: 33.2 g/dL (ref 31.5–35.7)
MCV: 91 fL (ref 79–97)
Platelets: 270 10*3/uL (ref 150–450)
RBC: 4.57 x10E6/uL (ref 3.77–5.28)
RDW: 11.8 % (ref 11.7–15.4)
WBC: 8.1 10*3/uL (ref 3.4–10.8)

## 2019-04-25 NOTE — Progress Notes (Signed)
WNL- released to mychart

## 2019-07-02 IMAGING — US US MFM UA DOPPLER RE-EVAL
1 series · 10 of 10 positions shown · non-contrast
Comparison: none

[Series 1: us mfm ua doppler re-eval · 0.25mm/px · 10 acquisitions, 10 frames shown]
[im 1/10]
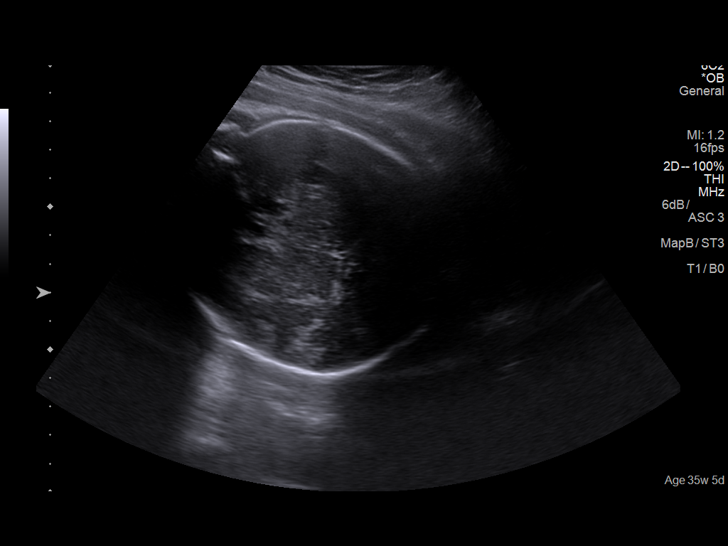
[im 2/10]
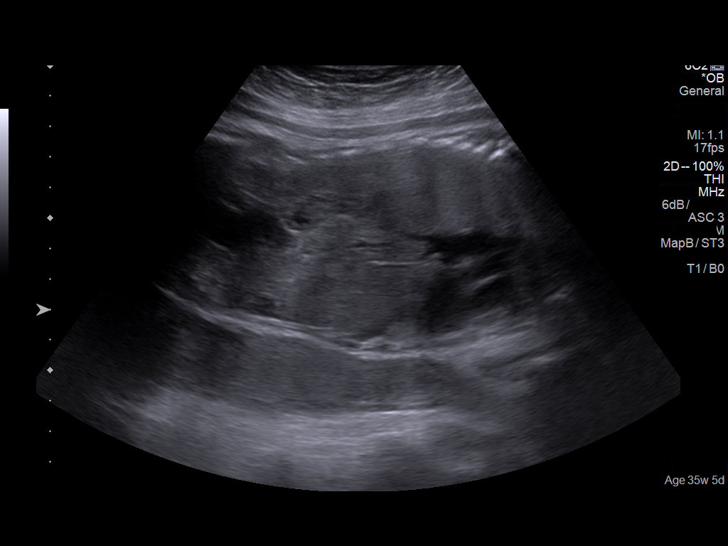
[im 3/10]
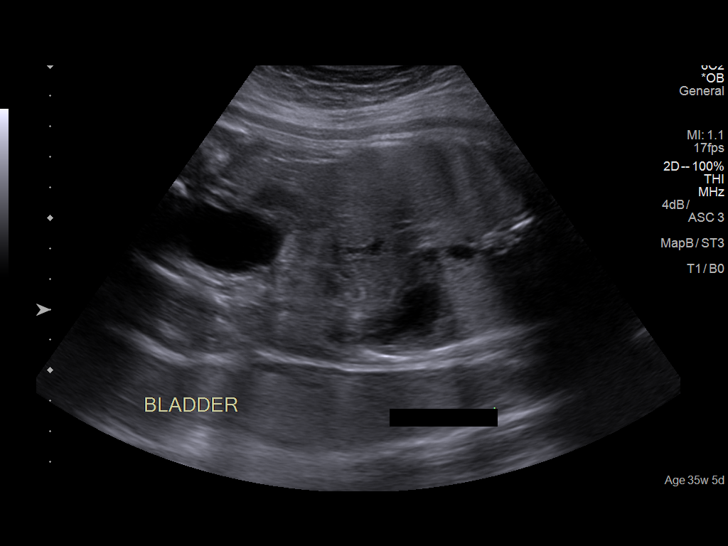
[im 4/10]
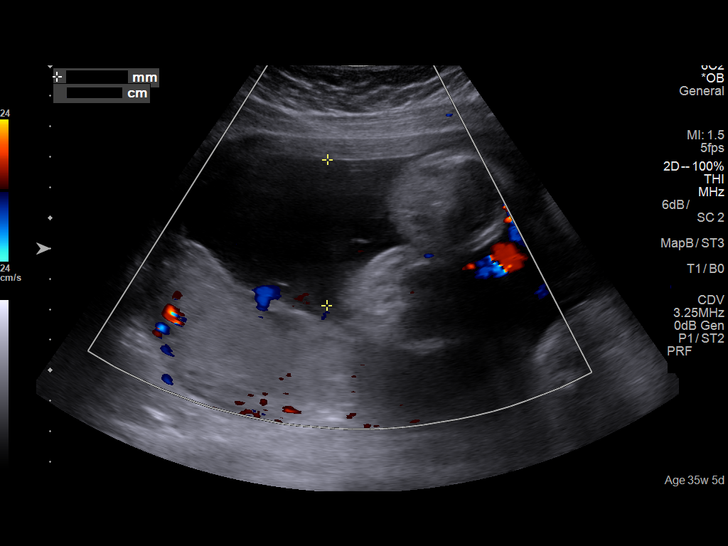
[im 5/10]
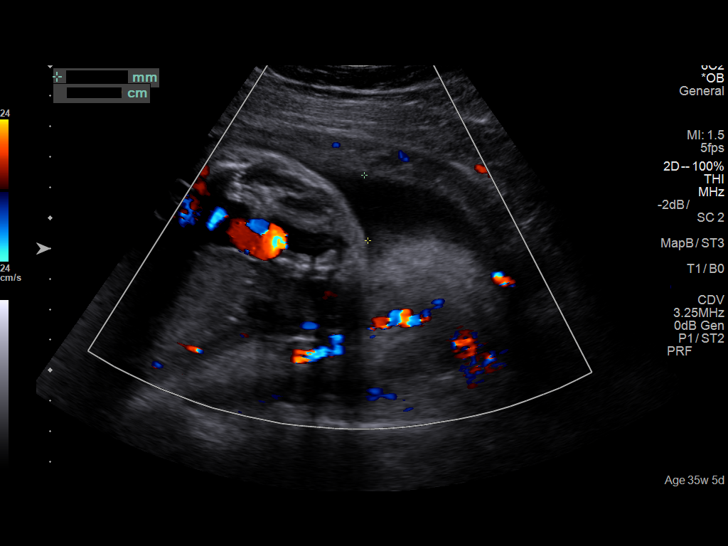
[im 6/10]
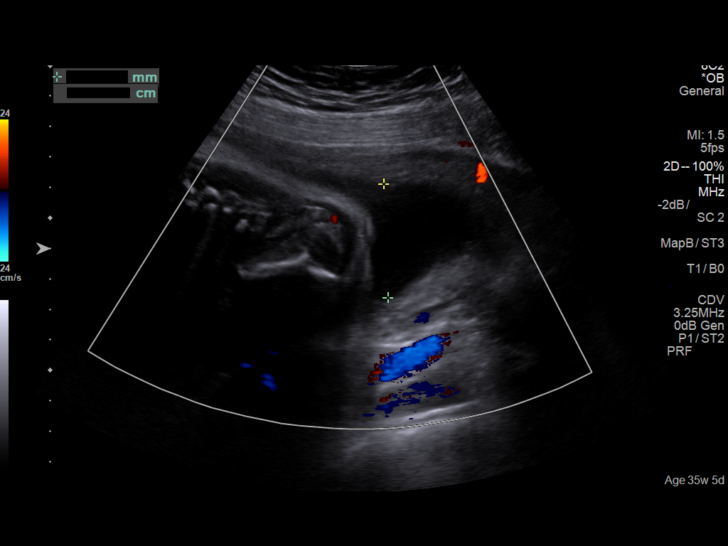
[im 7/10]
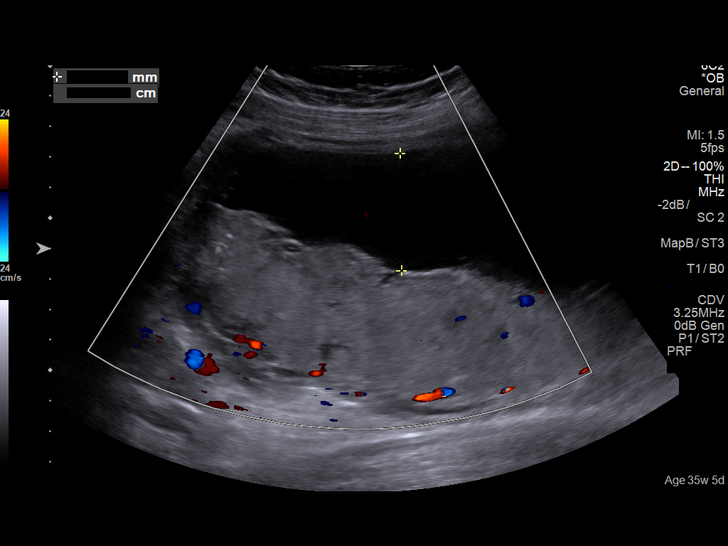
[im 8/10]
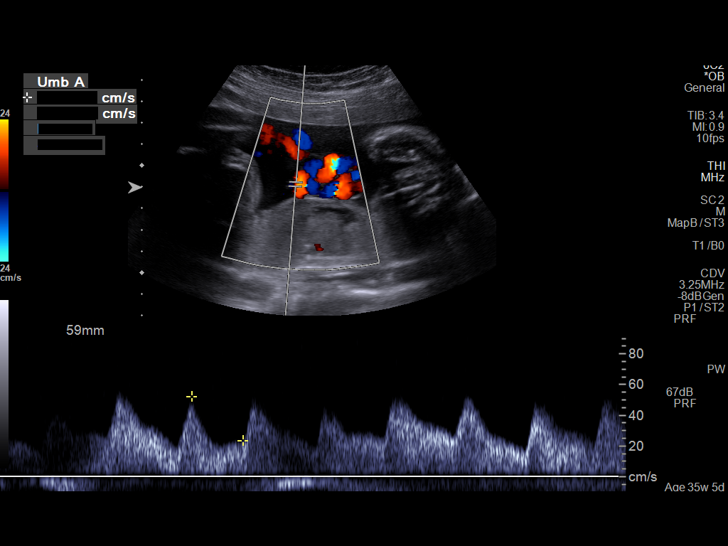
[im 9/10]
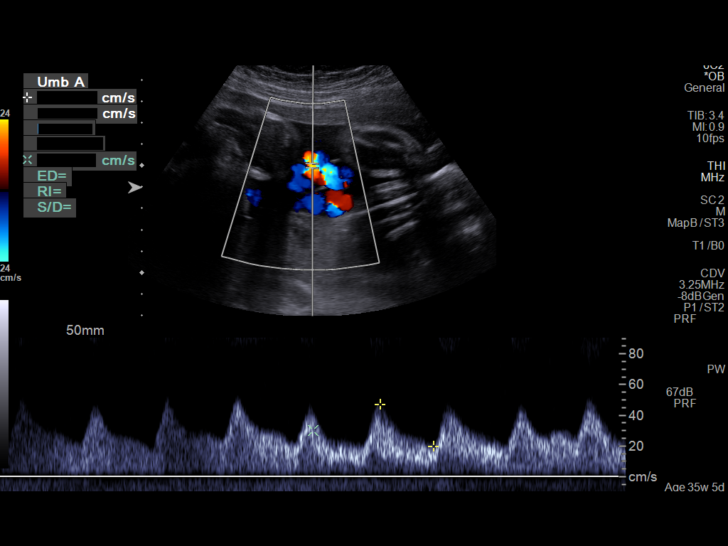
[im 10/10]
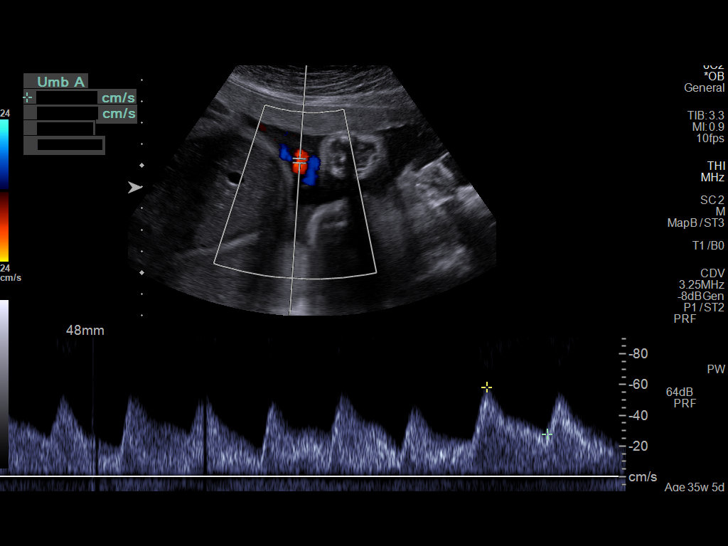

[10 of 10 positions shown; findings below may reference images not displayed]

Canned report from images found in remote index.

Refer to host system for actual result text.

## 2019-07-09 IMAGING — US US MFM FETAL BPP W/O NON-STRESS
1 series · 14 of 22 positions shown · non-contrast
Comparison: none

[Series 1: us mfm fetal bpp w/o non-stress · 0.22mm/px · 14 of 22 slices shown]
[im 1/22]
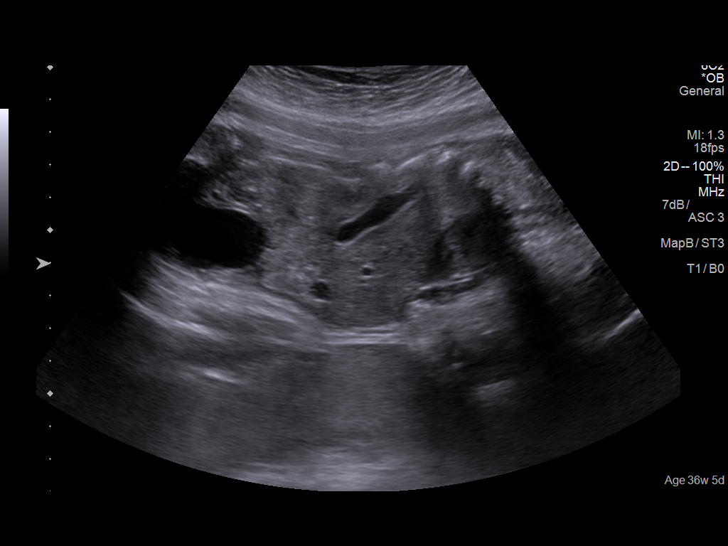
[im 3/22]
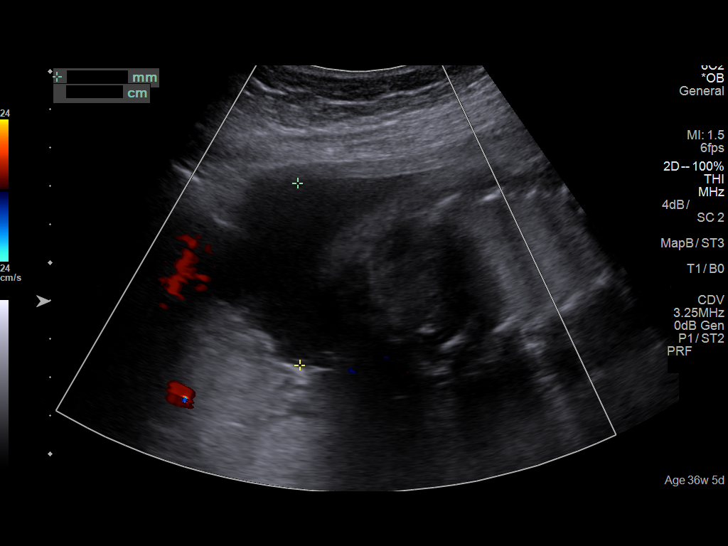
[im 4/22]
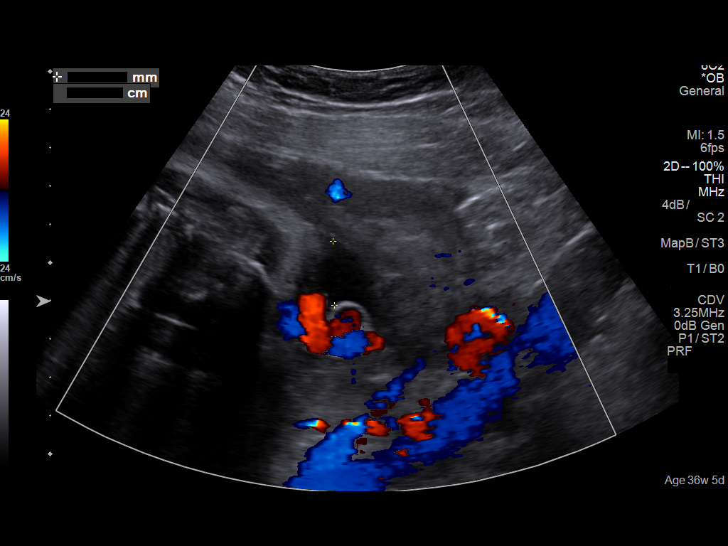
[im 6/22]
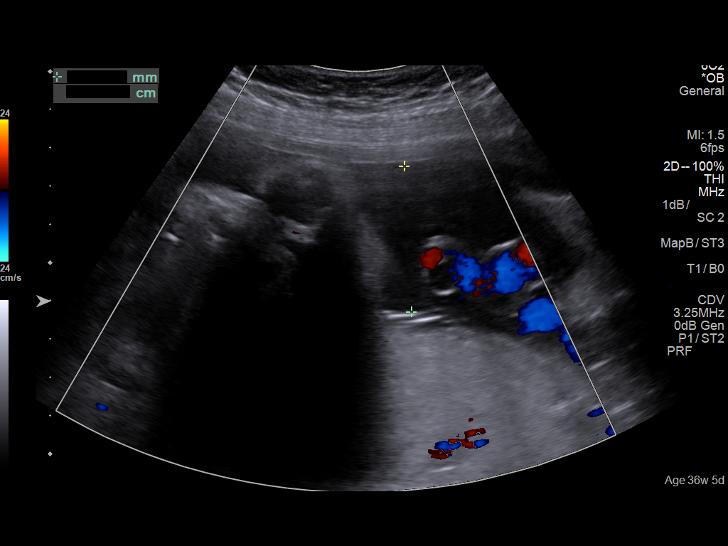
[im 8/22]
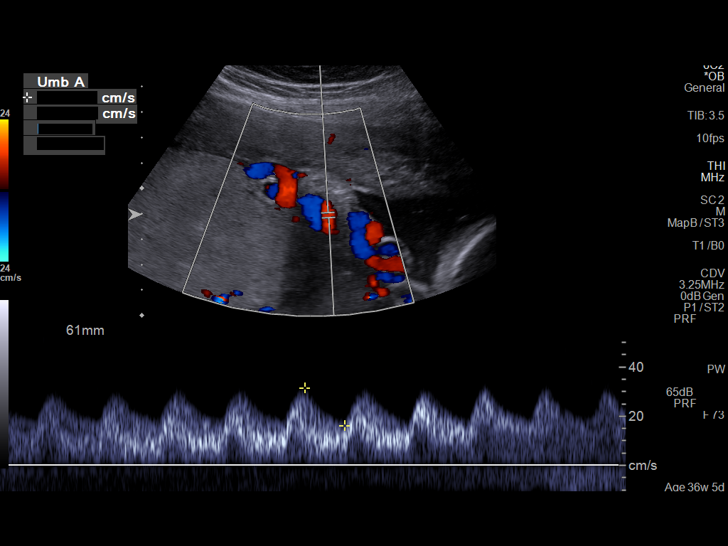
[im 9/22]
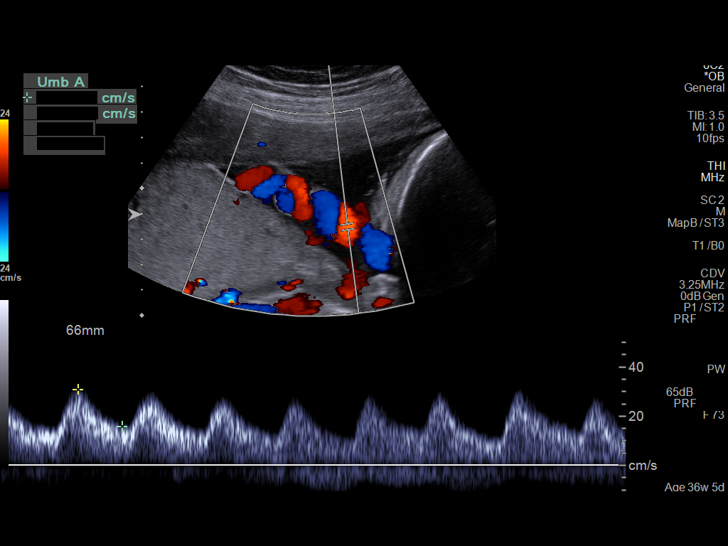
[im 11/22]
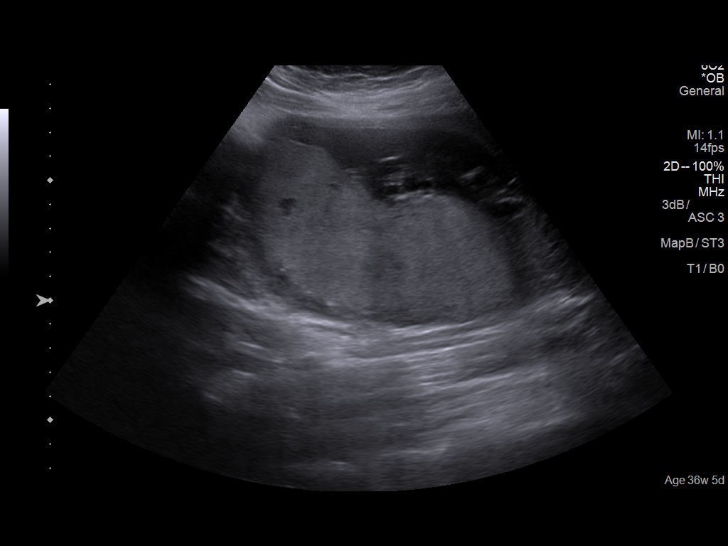
[im 12/22]
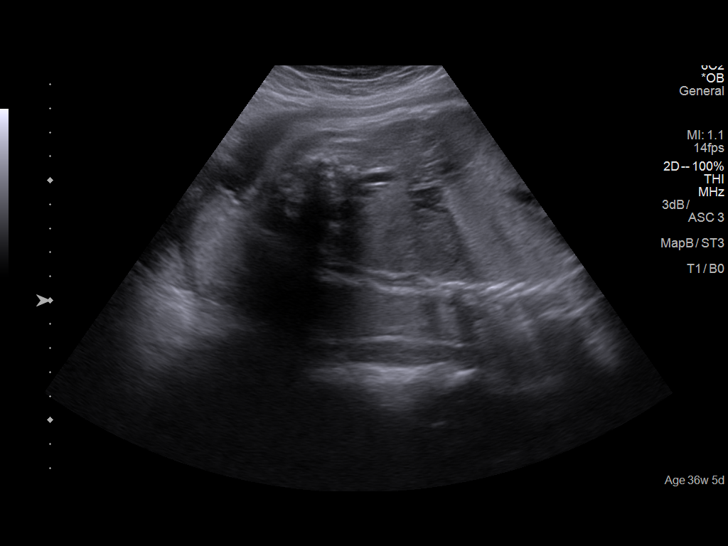
[im 14/22]
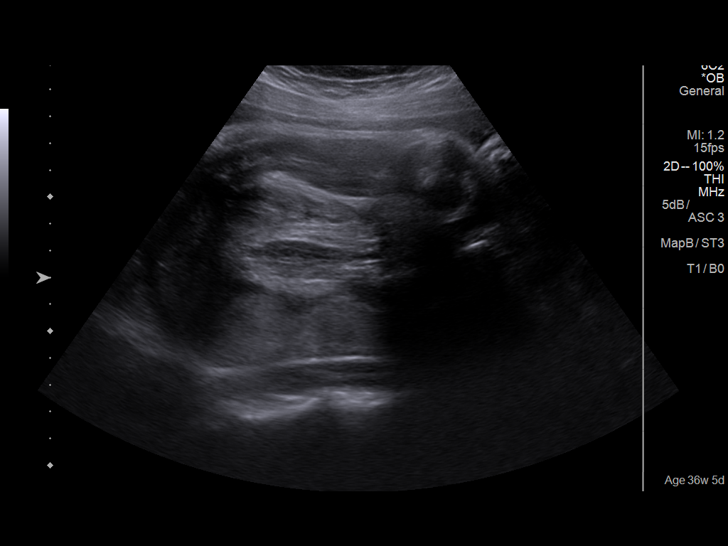
[im 15/22]
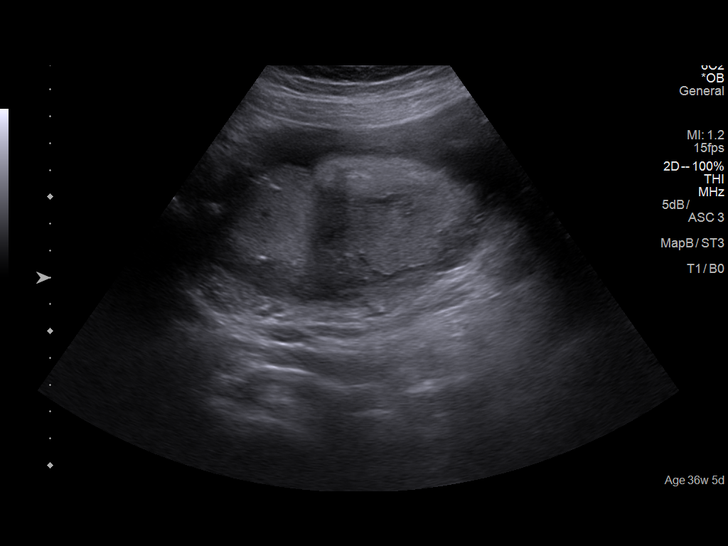
[im 17/22]
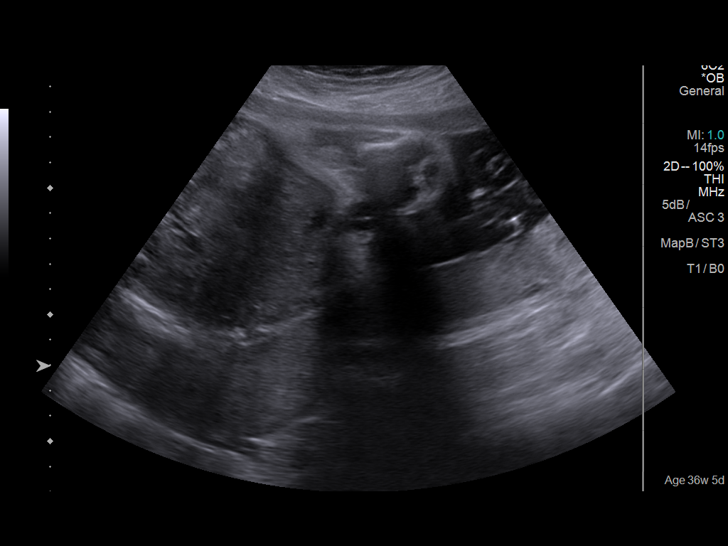
[im 19/22]
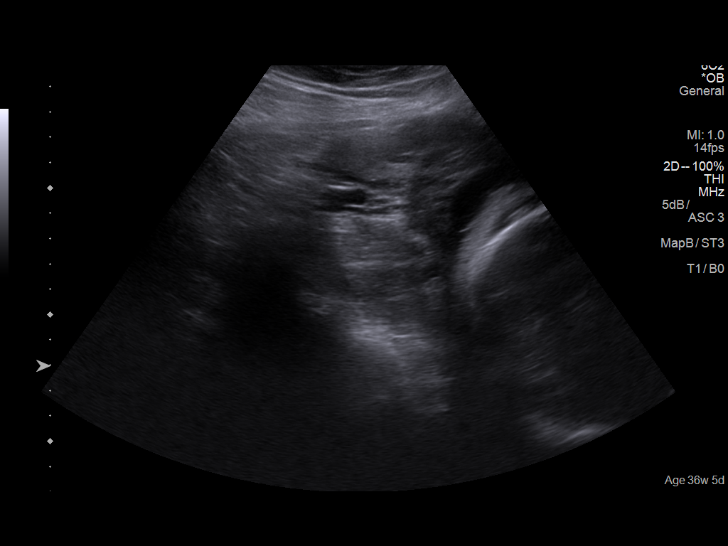
[im 20/22]
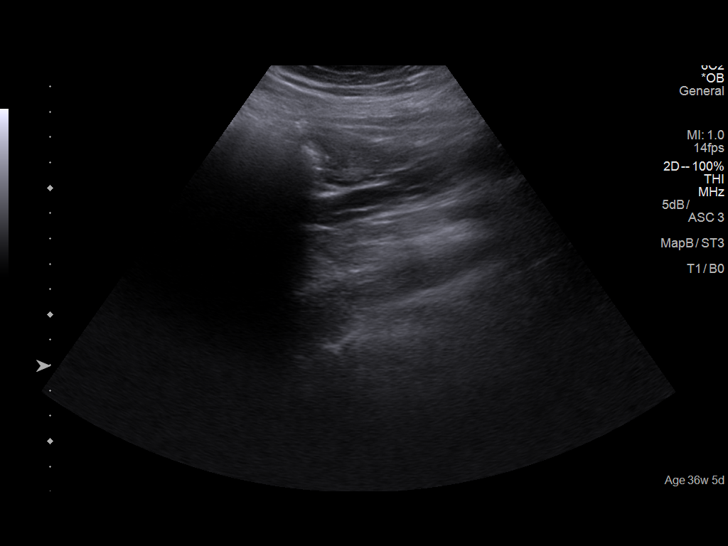
[im 22/22]
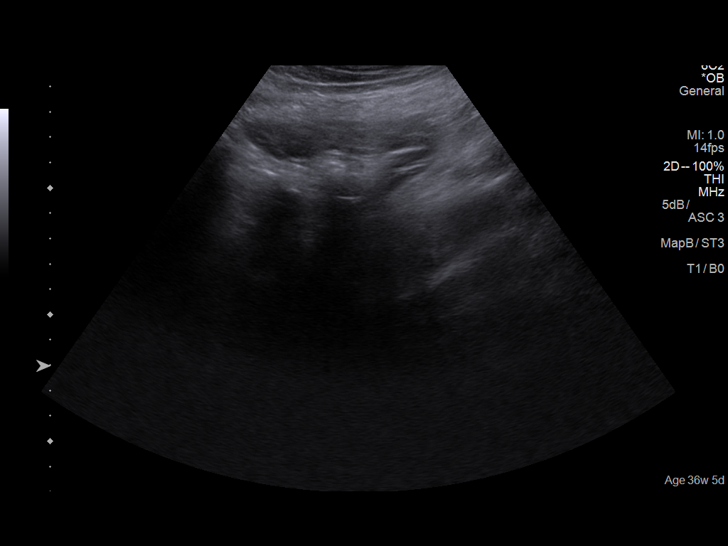

[14 of 22 positions shown; findings below may reference images not displayed]

Canned report from images found in remote index.

Refer to host system for actual result text.

## 2019-08-18 ENCOUNTER — Encounter: Payer: Self-pay | Admitting: Family

## 2019-08-18 ENCOUNTER — Other Ambulatory Visit: Payer: Self-pay

## 2019-08-18 ENCOUNTER — Ambulatory Visit (INDEPENDENT_AMBULATORY_CARE_PROVIDER_SITE_OTHER): Payer: BLUE CROSS/BLUE SHIELD | Admitting: Family

## 2019-08-18 VITALS — BP 138/60 | HR 76 | Ht 66.0 in | Wt 168.2 lb

## 2019-08-18 DIAGNOSIS — R030 Elevated blood-pressure reading, without diagnosis of hypertension: Secondary | ICD-10-CM | POA: Diagnosis not present

## 2019-08-18 DIAGNOSIS — Q210 Ventricular septal defect: Secondary | ICD-10-CM | POA: Diagnosis not present

## 2019-08-18 NOTE — Patient Instructions (Signed)
Medication Instructions:  Your physician recommends that you continue on your current medications as directed. Please refer to the Current Medication list given to you today.  *If you need a refill on your cardiac medications before your next appointment, please call your pharmacy*   Lab Work: None If you have labs (blood work) drawn today and your tests are completely normal, you will receive your results only by: Marland Kitchen MyChart Message (if you have MyChart) OR . A paper copy in the mail If you have any lab test that is abnormal or we need to change your treatment, we will call you to review the results.   Testing/Procedures: Your physician has requested that you have an echocardiogram. Echocardiography is a painless test that uses sound waves to create images of your heart. It provides your doctor with information about the size and shape of your heart and how well your heart's chambers and valves are working. This procedure takes approximately one hour. There are no restrictions for this procedure.     Follow-Up: At [facility], you and your health needs are our priority.  As part of our continuing mission to provide you with exceptional heart care, we have created designated Provider Care Teams.  These Care Teams include your primary Cardiologist (physician) and Advanced Practice Providers (APPs -  Physician Assistants and Nurse Practitioners) who all work together to provide you with the care you need, when you need it.  We recommend signing up for the patient portal called "MyChart".  Sign up information is provided on this After Visit Summary.  MyChart is used to connect with patients for Virtual Visits (Telemedicine).  Patients are able to view lab/test results, encounter notes, upcoming appointments, etc.  Non-urgent messages can be sent to your provider as well.   To learn more about what you can do with MyChart, go to [email or web address].    Your next appointment:   1  year(s)  The format for your next appointment:   In Person  Provider:    You may see Julien Nordmann, MD or one of the following Advanced Practice Providers on your designated Care Team:    Nicolasa Ducking, NP  Eula Listen, PA-C  Marisue Ivan, PA-C    Other Instructions  Echocardiogram An echocardiogram is a procedure that uses painless sound waves (ultrasound) to produce an image of the heart. Images from an echocardiogram can provide important information about:  Signs of coronary artery disease (CAD).  Aneurysm detection. An aneurysm is a weak or damaged part of an artery wall that bulges out from the normal force of blood pumping through the body.  Heart size and shape. Changes in the size or shape of the heart can be associated with certain conditions, including heart failure, aneurysm, and CAD.  Heart muscle function.  Heart valve function.  Signs of a past heart attack.  Fluid buildup around the heart.  Thickening of the heart muscle.  A tumor or infectious growth around the heart valves. Tell a health care provider about:  Any allergies you have.  All medicines you are taking, including vitamins, herbs, eye drops, creams, and over-the-counter medicines.  Any blood disorders you have.  Any surgeries you have had.  Any medical conditions you have.  Whether you are pregnant or may be pregnant. What are the risks? Generally, this is a safe procedure. However, problems may occur, including:  Allergic reaction to dye (contrast) that may be used during the procedure. What happens before the procedure? No specific  preparation is needed. You may eat and drink normally. What happens during the procedure?   An IV tube may be inserted into one of your veins.  You may receive contrast through this tube. A contrast is an injection that improves the quality of the pictures from your heart.  A gel will be applied to your chest.  A wand-like tool  (transducer) will be moved over your chest. The gel will help to transmit the sound waves from the transducer.  The sound waves will harmlessly bounce off of your heart to allow the heart images to be captured in real-time motion. The images will be recorded on a computer. The procedure may vary among health care providers and hospitals. What happens after the procedure?  You may return to your normal, everyday life, including diet, activities, and medicines, unless your health care provider tells you not to do that. Summary  An echocardiogram is a procedure that uses painless sound waves (ultrasound) to produce an image of the heart.  Images from an echocardiogram can provide important information about the size and shape of your heart, heart muscle function, heart valve function, and fluid buildup around your heart.  You do not need to do anything to prepare before this procedure. You may eat and drink normally.  After the echocardiogram is completed, you may return to your normal, everyday life, unless your health care provider tells you not to do that. This information is not intended to replace advice given to you by your health care provider. Make sure you discuss any questions you have with your health care provider. Document Revised: 09/08/2018 Document Reviewed: 06/20/2016 Elsevier Patient Education  [place].

## 2019-08-18 NOTE — Progress Notes (Signed)
Office Visit    Patient Name: Tiffany Conner Date of Encounter: 08/18/2019  Primary Care Provider:  Lucille Passy, MD Primary Cardiologist:  Ida Rogue, MD Electrophysiologist:  None   Chief Complaint    Tiffany Conner is a 29 y.o. female with a hx of supracristal VSD, HTN presents today for follow up of VSD.   Past Medical History    Past Medical History:  Diagnosis Date  . Heart murmur   . VSD (ventricular septal defect)    Past Surgical History:  Procedure Laterality Date  . CESAREAN SECTION N/A 02/27/2018   Procedure: CESAREAN SECTION;  Surgeon: Malachy Mood, MD;  Location: [facility] ORS;  Service: Obstetrics;  Laterality: N/A;  . MYRINGOTOMY    . TIBIA FRACTURE SURGERY      Allergies  No Known Allergies  History of Present Illness    Tiffany Conner is a 29 y.o. female with a hx of supracristal VSD, HTN last seen 12/06/2017 by Dr. Rockey Situ.  Previously seen by pediatric cardiology up to 2011. Echo in 2006, 2011, and 2017. Per Dr. Donivan Scull last note she has a very small supracristal VSD, normal right heart size and function with no elevated right heart pressures.   Has an 5 old son at home and also works from home. She works in Pharmacologist.   No chest pain, pressure, tightness.  Reports shortness of breath nor dyspnea on exertion. No lightheadedness, dizziness, near syncope.   Tells me her blood pressures normally elevated when she is seen in the office today.  She checks her blood pressure intermittently at work with readings typically BP 120s/70s.   Exercises regularly by following YouTube videos for cardiovascular and weight lifting exercises. Endorses following a healthy diet.   EKGs/Labs/Other Studies Reviewed:   The following studies were reviewed today:  Echo 10/2015 Left ventricle: The cavity size was normal. Systolic function was    normal. The estimated ejection fraction was in the range of 55%    to 60%. Wall motion was  normal; there were no regional wall    motion abnormalities. Left ventricular diastolic function    parameters were normal.  - Left atrium: The atrium was normal in size.  - Right ventricle: Systolic function was normal.  - Pulmonary arteries: Systolic pressure was within the normal    range.   EKG:  EKG is ordered today.  The ekg ordered today demonstrates NSR 76 bpm with no acute ST/T wave changes.   Recent Labs: 04/24/2019: Hemoglobin 13.8; Platelets 270  Recent Lipid Panel No results found for: CHOL, TRIG, HDL, CHOLHDL, VLDL, LDLCALC, LDLDIRECT  Home Medications   Current Meds  Medication Sig  . levonorgestrel-ethinyl estradiol (SEASONALE) 0.15-0.03 MG tablet Take 1 tablet by mouth daily.  . Multiple Vitamins-Minerals (MULTIVITAMIN WITH MINERALS) tablet Take 1 tablet by mouth daily.     Review of Systems      Review of Systems  Constitution: Negative for chills, fever and malaise/fatigue.  Cardiovascular: Negative for chest pain, dyspnea on exertion, leg swelling, near-syncope, orthopnea, palpitations and syncope.  Respiratory: Negative for cough, shortness of breath and wheezing.   Gastrointestinal: Negative for nausea and vomiting.  Neurological: Negative for dizziness, light-headedness and weakness.   All other systems reviewed and are otherwise negative except as noted above.  Physical Exam    VS:  BP 138/60 (BP Location: Left Arm, Patient Position: Sitting, Cuff Size: Normal)   Pulse 76   Ht 5\' 6"  (1.676 m)   Wt 168  lb 4 oz (76.3 kg)   SpO2 99%   BMI 27.16 kg/m  , BMI Body mass index is 27.16 kg/m. GEN: Well nourished, well developed, in no acute distress. HEENT: normal. Neck: Supple, no JVD, carotid bruits, or masses. Cardiac: RRR, no rubs, or gallops. Gr 2/6 murmur parasternal. No clubbing, cyanosis, edema.  Radials/DP/PT 2+ and equal bilaterally.  Respiratory:  Respirations regular and unlabored, clear to auscultation bilaterally. GI: Soft, nontender,  nondistended, BS + x 4. MS: No deformity or atrophy. Skin: Warm and dry, no rash. Neuro:  Strength and sensation are intact. Psych: Normal affect.  Assessment & Plan    1. Supracristal VSD - Known hx. Last echo 10/2015 with LVEF 55-60%, very small supracristal VSD with small doppler flow, RV and LV normal size and function, no significant valvular abnormalities. Repeat echocardiogram for monitoring and re-evaluation of chamber size, aortic valve.  2. Elevated blood pressure - BP mildly elevated today 138/60. Recent office visit with OBGYN 130/70. Recommend home monitoring. She will report BP consistently >130/80.  Disposition: Echocardiogram for monitoring of VSD. Follow up in 1 year(s) with Dr. Mariah Milling or APP.    Alver Sorrow, NP 08/18/2019, 4:32 PM

## 2019-09-25 ENCOUNTER — Ambulatory Visit (INDEPENDENT_AMBULATORY_CARE_PROVIDER_SITE_OTHER): Payer: BLUE CROSS/BLUE SHIELD | Admitting: Primary Care

## 2019-09-25 ENCOUNTER — Other Ambulatory Visit: Payer: Self-pay

## 2019-09-25 ENCOUNTER — Encounter: Payer: Self-pay | Admitting: Primary Care

## 2019-09-25 DIAGNOSIS — K649 Unspecified hemorrhoids: Secondary | ICD-10-CM | POA: Diagnosis not present

## 2019-09-25 NOTE — Progress Notes (Signed)
Subjective:    Patient ID: Tiffany Conner, female    DOB: [birth year], 29 y.o.   MRN: [id number]  HPI  This visit occurred during the SARS-CoV-2 public health emergency.  Safety protocols were in place, including screening questions prior to the visit, additional usage of staff PPE, and extensive cleaning of exam room while observing appropriate contact time as indicated for disinfecting solutions.   Tiffany Conner is a 29 year old female who presents today with a chief complaint of hemorrhoids.  Chronic and intermittent for the last 5-6 years, worse over the last 6 months. Symptoms include itching, irritation, discomfort, sometimes with bright red rectal bleed. She denies constipation, firm stools.   She used to lift heavy weights at the gym, thought this was causing the hemorrhoids, but over the last year she has been lifting lighter weights. She's tried preparation H, witch hazel without improvement. She's been using Vaseline for comfort.  She has not tried suppositories.   Review of Systems  Gastrointestinal: Negative for blood in stool and constipation.       Hemorrhoids  Skin: Negative for color change.       Past Medical History:  Diagnosis Date  . Heart murmur   . VSD (ventricular septal defect)      Social History   Socioeconomic History  . Marital status: Married    Spouse name: Quita Skye  . Number of children: Not on file  . Years of education: 75  . Highest education level: Not on file  Occupational History  . Occupation: [employer]  Tobacco Use  . Smoking status: Never Smoker  . Smokeless tobacco: Never Used  Substance and Sexual Activity  . Alcohol use: No  . Drug use: No  . Sexual activity: Yes    Birth control/protection: Pill  Other Topics Concern  . Not on file  Social History Narrative   Married.   Works as a Warehouse manager for [employer]    Graduate of [employer].   Enjoys reading exercise.    Social Determinants of Health   Financial Resource  Strain:   . Difficulty of Paying Living Expenses:   Food Insecurity:   . Worried About Charity fundraiser in the Last Year:   . Arboriculturist in the Last Year:   Transportation Needs:   . Film/video editor (Medical):   Marland Kitchen Lack of Transportation (Non-Medical):   Physical Activity:   . Days of Exercise per Week:   . Minutes of Exercise per Session:   Stress:   . Feeling of Stress :   Social Connections:   . Frequency of Communication with Friends and Family:   . Frequency of Social Gatherings with Friends and Family:   . Attends Religious Services:   . Active Member of Clubs or Organizations:   . Attends Archivist Meetings:   Marland Kitchen Marital Status:   Intimate Partner Violence:   . Fear of Current or Ex-Partner:   . Emotionally Abused:   Marland Kitchen Physically Abused:   . Sexually Abused:     Past Surgical History:  Procedure Laterality Date  . CESAREAN SECTION N/A 02/27/2018   Procedure: CESAREAN SECTION;  Surgeon: Malachy Mood, MD;  Location: [facility] ORS;  Service: Obstetrics;  Laterality: N/A;  . MYRINGOTOMY    . TIBIA FRACTURE SURGERY      Family History  Problem Relation Age of Onset  . Ovarian cancer Other     No Known Allergies  Current Outpatient Medications on File  Prior to Visit  Medication Sig Dispense Refill  . levonorgestrel-ethinyl estradiol (SEASONALE) 0.15-0.03 MG tablet Take 1 tablet by mouth daily. 1 Package 4  . Multiple Vitamins-Minerals (MULTIVITAMIN WITH MINERALS) tablet Take 1 tablet by mouth daily.     No current facility-administered medications on file prior to visit.    BP 134/82   Pulse 81   Temp (!) 96 F (35.6 C) (Temporal)   Ht 5\' 6"  (1.676 m)   Wt 166 lb 4 oz (75.4 kg)   SpO2 98%   BMI 26.83 kg/m    Objective:   Physical Exam  Constitutional: She appears well-nourished.  Cardiovascular: Normal rate and regular rhythm.  Respiratory: Effort normal and breath sounds normal.  Genitourinary: Rectum:     External hemorrhoid  present.     Genitourinary Comments: Several, small, non thrombosed hemorrhoids to anus.            Assessment & Plan:

## 2019-09-25 NOTE — Assessment & Plan Note (Signed)
Perhaps anal tags? Several non thrombosed hemorrhoid like appearing masses to anus.   Recommended suppositories to start. If no improvement then likely has anal tags which can be removed. Consider GI vs general surgery.

## 2019-09-25 NOTE — Patient Instructions (Signed)
Try over the counter suppositories for hemorrhoids for about one week.  Please update me if no improvement.  It was a pleasure to see you today!   Hemorrhoids Hemorrhoids are swollen veins in and around the rectum or anus. There are two types of hemorrhoids:  Internal hemorrhoids. These occur in the veins that are just inside the rectum. They may poke through to the outside and become irritated and painful.  External hemorrhoids. These occur in the veins that are outside the anus and can be felt as a painful swelling or hard lump near the anus. Most hemorrhoids do not cause serious problems, and they can be managed with home treatments such as diet and lifestyle changes. If home treatments do not help the symptoms, procedures can be done to shrink or remove the hemorrhoids. What are the causes? This condition is caused by increased pressure in the anal area. This pressure may result from various things, including:  Constipation.  Straining to have a bowel movement.  Diarrhea.  Pregnancy.  Obesity.  Sitting for long periods of time.  Heavy lifting or other activity that causes you to strain.  Anal sex.  Riding a bike for a long period of time. What are the signs or symptoms? Symptoms of this condition include:  Pain.  Anal itching or irritation.  Rectal bleeding.  Leakage of stool (feces).  Anal swelling.  One or more lumps around the anus. How is this diagnosed? This condition can often be diagnosed through a visual exam. Other exams or tests may also be done, such as:  An exam that involves feeling the rectal area with a gloved hand (digital rectal exam).  An exam of the anal canal that is done using a small tube (anoscope).  A blood test, if you have lost a significant amount of blood.  A test to look inside the colon using a flexible tube with a camera on the end (sigmoidoscopy or colonoscopy). How is this treated? This condition can usually be treated at  home. However, various procedures may be done if dietary changes, lifestyle changes, and other home treatments do not help your symptoms. These procedures can help make the hemorrhoids smaller or remove them completely. Some of these procedures involve surgery, and others do not. Common procedures include:  Rubber band ligation. Rubber bands are placed at the base of the hemorrhoids to cut off their blood supply.  Sclerotherapy. Medicine is injected into the hemorrhoids to shrink them.  Infrared coagulation. A type of light energy is used to get rid of the hemorrhoids.  Hemorrhoidectomy surgery. The hemorrhoids are surgically removed, and the veins that supply them are tied off.  Stapled hemorrhoidopexy surgery. The surgeon staples the base of the hemorrhoid to the rectal wall. Follow these instructions at home: Eating and drinking   Eat foods that have a lot of fiber in them, such as whole grains, beans, nuts, fruits, and vegetables.  Ask your health care provider about taking products that have added fiber (fiber supplements).  Reduce the amount of fat in your diet. You can do this by eating low-fat dairy products, eating less red meat, and avoiding processed foods.  Drink enough fluid to keep your urine pale yellow. Managing pain and swelling   Take warm sitz baths for 20 minutes, 3-4 times a day to ease pain and discomfort. You may do this in a bathtub or using a portable sitz bath that fits over the toilet.  If directed, apply ice to the affected  area. Using ice packs between sitz baths may be helpful. ? Put ice in a plastic bag. ? Place a towel between your skin and the bag. ? Leave the ice on for 20 minutes, 2-3 times a day. General instructions  Take over-the-counter and prescription medicines only as told by your health care provider.  Use medicated creams or suppositories as told.  Get regular exercise. Ask your health care provider how much and what kind of exercise is  best for you. In general, you should do moderate exercise for at least 30 minutes on most days of the week (150 minutes each week). This can include activities such as walking, biking, or yoga.  Go to the bathroom when you have the urge to have a bowel movement. Do not wait.  Avoid straining to have bowel movements.  Keep the anal area dry and clean. Use wet toilet paper or moist towelettes after a bowel movement.  Do not sit on the toilet for long periods of time. This increases blood pooling and pain.  Keep all follow-up visits as told by your health care provider. This is important. Contact a health care provider if you have:  Increasing pain and swelling that are not controlled by treatment or medicine.  Difficulty having a bowel movement, or you are unable to have a bowel movement.  Pain or inflammation outside the area of the hemorrhoids. Get help right away if you have:  Uncontrolled bleeding from your rectum. Summary  Hemorrhoids are swollen veins in and around the rectum or anus.  Most hemorrhoids can be managed with home treatments such as diet and lifestyle changes.  Taking warm sitz baths can help ease pain and discomfort.  In severe cases, procedures or surgery can be done to shrink or remove the hemorrhoids. This information is not intended to replace advice given to you by your health care provider. Make sure you discuss any questions you have with your health care provider. Document Revised: 10/14/2018 Document Reviewed: 10/07/2017 Elsevier Patient Education  [place].

## 2019-10-06 ENCOUNTER — Ambulatory Visit (INDEPENDENT_AMBULATORY_CARE_PROVIDER_SITE_OTHER): Payer: BLUE CROSS/BLUE SHIELD

## 2019-10-06 ENCOUNTER — Other Ambulatory Visit: Payer: Self-pay

## 2019-10-06 DIAGNOSIS — Q210 Ventricular septal defect: Secondary | ICD-10-CM | POA: Diagnosis not present

## 2020-01-30 ENCOUNTER — Encounter: Payer: Self-pay | Admitting: Obstetrics

## 2020-01-30 ENCOUNTER — Other Ambulatory Visit: Payer: Self-pay

## 2020-01-30 ENCOUNTER — Ambulatory Visit (INDEPENDENT_AMBULATORY_CARE_PROVIDER_SITE_OTHER): Payer: BLUE CROSS/BLUE SHIELD | Admitting: Obstetrics

## 2020-01-30 ENCOUNTER — Other Ambulatory Visit (HOSPITAL_COMMUNITY)
Admission: RE | Admit: 2020-01-30 | Discharge: 2020-01-30 | Disposition: A | Discharge status: Home / Self Care | Payer: BLUE CROSS/BLUE SHIELD | Source: Ambulatory Visit | Attending: Obstetrics | Admitting: Obstetrics

## 2020-01-30 VITALS — BP 118/70 | Wt 172.0 lb

## 2020-01-30 DIAGNOSIS — Z3481 Encounter for supervision of other normal pregnancy, first trimester: Secondary | ICD-10-CM | POA: Insufficient documentation

## 2020-01-30 DIAGNOSIS — Z3A08 8 weeks gestation of pregnancy: Secondary | ICD-10-CM

## 2020-01-30 DIAGNOSIS — Z3480 Encounter for supervision of other normal pregnancy, unspecified trimester: Secondary | ICD-10-CM | POA: Insufficient documentation

## 2020-01-30 NOTE — Progress Notes (Signed)
New Obstetric Patient H&P    Chief Complaint: "Desires prenatal care"   History of Present Illness: Patient is a 29 y.o. G2P1001 Not Hispanic or Latino female, LMP 11/30/2019 presents with amenorrhea and positive home pregnancy test. Based on her  LMP, her EDD is Estimated Date of Delivery: 09/05/20 and her EGA is [redacted]w[redacted]d. Cycles are 5. days, regular, and occur approximately every : 28 days. Her last pap smear was 3 years ago and was no abnormalities.    She had a urine pregnancy test which was positive about 2 week(s)  ago. Her last menstrual period was normal and lasted for  about 5 day(s). Since her LMP she claims she has experienced nausea and breast tenderness. She denies vaginal bleeding. Her past medical history is noncontributory. Her prior pregnancies are notable for IUGR  Since her LMP, she admits to the use of tobacco products  no She claims she has gained   no pounds since the start of her pregnancy.  There are cats in the home in the home  yes If yes Indoor She admits close contact with children on a regular basis  yes  She has had chicken pox in the past no She has had Tuberculosis exposures, symptoms, or previously tested positive for TB   no Current or past history of domestic violence. no  Genetic Screening/Teratology Counseling: (Includes patient, baby's father, or anyone in either family with:)   1. Patient's age >/= 55 at [facility]  no 2. Thalassemia ([place], [place], Mediterranean, or Asian background): MCV<80  no 3. Neural tube defect (meningomyelocele, spina bifida, anencephaly)  no 4. Congenital heart defect  no  5. Down syndrome  no 6. Tay-Sachs (Jewish, [place]  no 7. Canavan's Disease  no 8. Sickle cell disease or trait (African)  no  9. Hemophilia or other blood disorders  no  10. Muscular dystrophy  no  11. Cystic fibrosis  no  12. Huntington's Chorea  no  13. Mental retardation/autism  no 14. Other inherited genetic or chromosomal disorder   no 15. Maternal metabolic disorder (DM, PKU, etc)  no 16. Patient or FOB with a child with a birth defect not listed above no  16a. Patient or FOB with a birth defect themselves no 17. Recurrent pregnancy loss, or stillbirth  no  18. Any medications since LMP other than prenatal vitamins (include vitamins, supplements, OTC meds, drugs, alcohol)  Yes- Ketoconazole for hr scalp 19. Any other genetic/environmental exposure to discuss  no  Infection History:   1. Lives with someone with TB or TB exposed  no  2. Patient or partner has history of genital herpes  no 3. Rash or viral illness since LMP  no 4. History of STI (GC, CT, HPV, syphilis, HIV)  no 5. History of recent travel :  no  Other pertinent information:  no     Review of Systems:10 point review of systems negative unless otherwise noted in HPI  Past Medical History:  Past Medical History:  Diagnosis Date  . Heart murmur   . VSD (ventricular septal defect)     Past Surgical History:  Past Surgical History:  Procedure Laterality Date  . CESAREAN SECTION N/A 02/27/2018   Procedure: CESAREAN SECTION;  Surgeon: Vena [place], MD;  Location: [facility] ORS;  Service: Obstetrics;  Laterality: N/A;  . MYRINGOTOMY    . TIBIA FRACTURE SURGERY      Gynecologic History: Patient's last menstrual period was 11/30/2019.  Obstetric History: G2P1001  Family History:  Family History  Problem Relation Age of Onset  . Ovarian cancer Other     Social History:  Social History   Socioeconomic History  . Marital status: Married    Spouse name: Madelaine Bhat  . Number of children: Not on file  . Years of education: 42  . Highest education level: Not on file  Occupational History  . Occupation: ACC COORDINATOR  Tobacco Use  . Smoking status: Never Smoker  . Smokeless tobacco: Never Used  Vaping Use  . Vaping Use: Never used  Substance and Sexual Activity  . Alcohol use: No  . Drug use: No  . Sexual activity: Yes    Birth  control/protection: Pill  Other Topics Concern  . Not on file  Social History Narrative   Married.   Works as a Nurse, adult for [employer].    Graduate of [employer].   Enjoys reading exercise.    Social Determinants of Health   Financial Resource Strain:   . Difficulty of Paying Living Expenses: Not on file  Food Insecurity:   . Worried About Programme researcher, broadcasting/film/video in the Last Year: Not on file  . Ran Out of Food in the Last Year: Not on file  Transportation Needs:   . Lack of Transportation (Medical): Not on file  . Lack of Transportation (Non-Medical): Not on file  Physical Activity:   . Days of Exercise per Week: Not on file  . Minutes of Exercise per Session: Not on file  Stress:   . Feeling of Stress : Not on file  Social Connections:   . Frequency of Communication with Friends and Family: Not on file  . Frequency of Social Gatherings with Friends and Family: Not on file  . Attends Religious Services: Not on file  . Active Member of Clubs or Organizations: Not on file  . Attends Banker Meetings: Not on file  . Marital Status: Not on file  Intimate Partner Violence:   . Fear of Current or Ex-Partner: Not on file  . Emotionally Abused: Not on file  . Physically Abused: Not on file  . Sexually Abused: Not on file    Allergies:  No Known Allergies  Medications: Prior to Admission medications   Medication Sig Start Date End Date Taking? Authorizing Provider  Multiple Vitamins-Minerals (MULTIVITAMIN WITH MINERALS) tablet Take 1 tablet by mouth daily.    [provider]    Physical Exam Vitals: Blood pressure 118/70, weight 172 lb (78 kg), last menstrual period 11/30/2019, not currently breastfeeding.  General: NAD HEENT: normocephalic, anicteric Thyroid: no enlargement, no palpable nodules Pulmonary: No increased work of breathing, CTAB Cardiovascular: RRR, distal pulses 2+ Abdomen: NABS, soft, non-tender, non-distended.  Umbilicus without  lesions.  No hepatomegaly, splenomegaly or masses palpable. No evidence of hernia  Genitourinary:  External: Normal external female genitalia.  Normal urethral meatus, normal  Bartholin's and Skene's glands.    Vagina: Normal vaginal mucosa, no evidence of prolapse.    Cervix: Grossly normal in appearance, no bleeding  Uterus: slightly enlarged, mobile, normal contour.  No CMT  Adnexa: ovaries non-enlarged, no adnexal masses  Rectal: deferred Extremities: no edema, erythema, or tenderness Neurologic: Grossly intact Psychiatric: mood appropriate, affect full   Assessment: 29 y.o. G2P1001 at [redacted]w[redacted]d presenting to initiate prenatal care  Plan: 1) Avoid alcoholic beverages. 2) Patient encouraged not to smoke.  3) Discontinue the use of all non-medicinal drugs and chemicals.  4) Take prenatal vitamins daily.  5) Nutrition, food  safety (fish, cheese advisories, and high nitrite foods) and exercise discussed. 6) Hospital and practice style discussed with cross coverage system.  7) Genetic Screening, such as with 1st Trimester Screening, cell free fetal DNA, AFP testing, and Ultrasound, as well as with amniocentesis and CVS as appropriate, is discussed with patient. At the conclusion of today's visit patient requested genetic testing. She will have the Inheritest, and will discuss the MaternT testing with her spouse. 8) Patient is asked about travel to areas at risk for the Zika virus, and counseled to avoid travel and exposure to mosquitoes or sexual partners who may have themselves been exposed to the virus. Testing is discussed, and will be ordered as appropriate.

## 2020-02-01 LAB — CYTOLOGY - PAP
Chlamydia: NEGATIVE
Comment: NEGATIVE
Comment: NEGATIVE
Comment: NORMAL
Diagnosis: NEGATIVE
Neisseria Gonorrhea: NEGATIVE
Trichomonas: NEGATIVE

## 2020-02-06 ENCOUNTER — Encounter: Payer: Self-pay | Admitting: Obstetrics

## 2020-02-14 ENCOUNTER — Other Ambulatory Visit: Payer: Self-pay

## 2020-02-14 ENCOUNTER — Other Ambulatory Visit: Payer: Self-pay | Admitting: Obstetrics and Gynecology

## 2020-02-14 ENCOUNTER — Other Ambulatory Visit (INDEPENDENT_AMBULATORY_CARE_PROVIDER_SITE_OTHER): Payer: BLUE CROSS/BLUE SHIELD

## 2020-02-14 ENCOUNTER — Ambulatory Visit (INDEPENDENT_AMBULATORY_CARE_PROVIDER_SITE_OTHER): Payer: BLUE CROSS/BLUE SHIELD | Admitting: Obstetrics and Gynecology

## 2020-02-14 ENCOUNTER — Encounter: Payer: Self-pay | Admitting: Obstetrics and Gynecology

## 2020-02-14 VITALS — BP 118/72 | Ht 66.0 in | Wt 175.6 lb

## 2020-02-14 DIAGNOSIS — Z3491 Encounter for supervision of normal pregnancy, unspecified, first trimester: Secondary | ICD-10-CM

## 2020-02-14 DIAGNOSIS — Z3A11 11 weeks gestation of pregnancy: Secondary | ICD-10-CM

## 2020-02-14 DIAGNOSIS — Z3A10 10 weeks gestation of pregnancy: Secondary | ICD-10-CM

## 2020-02-14 DIAGNOSIS — Z1379 Encounter for other screening for genetic and chromosomal anomalies: Secondary | ICD-10-CM

## 2020-02-14 DIAGNOSIS — Z3480 Encounter for supervision of other normal pregnancy, unspecified trimester: Secondary | ICD-10-CM

## 2020-02-14 DIAGNOSIS — J0100 Acute maxillary sinusitis, unspecified: Secondary | ICD-10-CM

## 2020-02-14 DIAGNOSIS — Z3A08 8 weeks gestation of pregnancy: Secondary | ICD-10-CM

## 2020-02-14 MED ORDER — AZITHROMYCIN 250 MG PO TABS: ORAL_TABLET | ORAL | 0 refills | Status: AC

## 2020-02-14 NOTE — Patient Instructions (Signed)

## 2020-02-14 NOTE — Progress Notes (Signed)
    Routine Prenatal Care Visit  Subjective  Tiffany Conner is a 29 y.o. G2P1001 at [redacted]w[redacted]d being seen today for ongoing prenatal care.  She is currently monitored for the following issues for this low-risk pregnancy and has PSORIASIS; VSD; White coat syndrome without diagnosis of hypertension; Hemorrhoids; and Supervision of other normal pregnancy, antepartum on their problem list.  ----------------------------------------------------------------------------------- Patient reports no complaints.   Contractions: Not present. Vag. Bleeding: None.  Movement: Absent. Denies leaking of fluid.  ----------------------------------------------------------------------------------- The following portions of the patient's history were reviewed and updated as appropriate: allergies, current medications, past family history, past medical history, past social history, past surgical history and problem list. Problem list updated.   Objective  Blood pressure 118/72, height 5\' 6"  (1.676 m), weight 175 lb 9.6 oz (79.7 kg), last menstrual period 11/30/2019, not currently breastfeeding. Pregravid weight Pregravid weight not on file Total Weight Gain Not found. Urinalysis:      Fetal Status: Fetal Heart Rate (bpm): 160   Movement: Absent     General:  Alert, oriented and cooperative. Patient is in no acute distress.  Skin: Skin is warm and dry. No rash noted.   Cardiovascular: Normal heart rate noted  Respiratory: Normal respiratory effort, no problems with respiration noted  Abdomen: Soft, gravid, appropriate for gestational age. Pain/Pressure: Absent     Pelvic:  Cervical exam deferred        Extremities: Normal range of motion.     Mental Status: Normal mood and affect. Normal behavior. Normal judgment and thought content.     Assessment   29 y.o. G2P1001 at [redacted]w[redacted]d by  09/05/2020, by Last Menstrual Period presenting for routine prenatal visit  Plan   pregnancy 2 Problems (from 01/30/20 to present)      Problem Noted Resolved   Supervision of other normal pregnancy, antepartum 01/30/2020 by 02/01/2020, CNM No   Overview Addendum 02/14/2020 12:37 PM by 02/16/2020, MD     Nursing Staff Provider  Office Location  [facility] Dating   LMP = 10 Wk Natale Milch  Language  English Anatomy US    Flu Vaccine   Genetic Screen  NIPS:   AFP:   First Screen:    TDaP vaccine    Hgb A1C or  GTT Early : Third trimester :   Rhogam     LAB RESULTS   Feeding Plan  Blood Type     Contraception  Antibody    Circumcision  Rubella    Pediatrician   RPR     Support Person  HBsAg     Prenatal Classes  HIV      Varicella   BTL Consent  GBS  (For PCN allergy, check sensitivities)        VBAC Consent  Pap  NIL 2021    Hgb Electro      CF      SMA                  Previous Version       Gestational age appropriate obstetric precautions including but not limited to vaginal bleeding, contractions, leaking of fluid and fetal movement were reviewed in detail with the patient.    Return in about 2 weeks (around 02/28/2020) for ROB and 03/01/2020 for NTm first trimester testing.  [place] MD [facility], [facility] 02/14/2020, 12:38 PM

## 2020-02-15 LAB — RPR+RH+ABO+RUB AB+AB SCR+CB...
Antibody Screen: NEGATIVE
HIV Screen 4th Generation wRfx: NONREACTIVE
Hematocrit: 38.8 % (ref 34.0–46.6)
Hemoglobin: 12.7 g/dL (ref 11.1–15.9)
Hepatitis B Surface Ag: NEGATIVE
MCH: 30.2 pg (ref 26.6–33.0)
MCHC: 32.7 g/dL (ref 31.5–35.7)
MCV: 92 fL (ref 79–97)
Platelets: 322 10*3/uL (ref 150–450)
RBC: 4.21 x10E6/uL (ref 3.77–5.28)
RDW: 11.2 % — ABNORMAL LOW (ref 11.7–15.4)
RPR Ser Ql: NONREACTIVE
Rh Factor: POSITIVE
Rubella Antibodies, IGG: 1.48 index (ref >0.99)
Varicella zoster IgG: 1042 index (ref >165)
WBC: 7.8 10*3/uL (ref 3.4–10.8)

## 2020-02-29 LAB — INHERITEST CORE(CF97,SMA,FRAX)

## 2020-03-01 ENCOUNTER — Other Ambulatory Visit: Payer: Self-pay

## 2020-03-01 ENCOUNTER — Ambulatory Visit (INDEPENDENT_AMBULATORY_CARE_PROVIDER_SITE_OTHER): Payer: BLUE CROSS/BLUE SHIELD | Admitting: Advanced Practice Midwife

## 2020-03-01 ENCOUNTER — Ambulatory Visit (INDEPENDENT_AMBULATORY_CARE_PROVIDER_SITE_OTHER): Payer: BLUE CROSS/BLUE SHIELD

## 2020-03-01 ENCOUNTER — Encounter: Payer: Self-pay | Admitting: Advanced Practice Midwife

## 2020-03-01 ENCOUNTER — Other Ambulatory Visit: Payer: Self-pay | Admitting: Advanced Practice Midwife

## 2020-03-01 VITALS — BP 124/60 | Wt 182.0 lb

## 2020-03-01 DIAGNOSIS — Z1379 Encounter for other screening for genetic and chromosomal anomalies: Secondary | ICD-10-CM

## 2020-03-01 DIAGNOSIS — Z3481 Encounter for supervision of other normal pregnancy, first trimester: Secondary | ICD-10-CM

## 2020-03-01 DIAGNOSIS — Z3A13 13 weeks gestation of pregnancy: Secondary | ICD-10-CM

## 2020-03-01 DIAGNOSIS — Z3480 Encounter for supervision of other normal pregnancy, unspecified trimester: Secondary | ICD-10-CM

## 2020-03-01 DIAGNOSIS — Z3482 Encounter for supervision of other normal pregnancy, second trimester: Secondary | ICD-10-CM

## 2020-03-01 LAB — POCT URINALYSIS DIPSTICK OB
Glucose, UA: NEGATIVE
POC,PROTEIN,UA: NEGATIVE

## 2020-03-01 NOTE — Patient Instructions (Addendum)
Exercise During Pregnancy Exercise is an important part of being healthy for people of all ages. Exercise improves the function of your heart and lungs and helps you maintain strength, flexibility, and a healthy body weight. Exercise also boosts energy levels and elevates mood. Most women should exercise regularly during pregnancy. In rare cases, women with certain medical conditions or complications may be asked to limit or avoid exercise during pregnancy. How does this affect me? Along with maintaining general strength and flexibility, exercising during pregnancy can help:  Keep strength in muscles that are used during labor and childbirth.  Decrease low back pain.  Reduce symptoms of depression.  Control weight gain during pregnancy.  Reduce the risk of needing insulin if you develop diabetes during pregnancy.  Decrease the risk of cesarean delivery.  Speed up your recovery after giving birth. How does this affect my baby? Exercise can help you have a healthy pregnancy. Exercise does not cause premature birth. It will not cause your baby to weigh less at birth. What exercises can I do? Many exercises are safe for you to do during pregnancy. Do a variety of exercises that safely increase your heart and breathing rates and help you build and maintain muscle strength. Do exercises exactly as told by your health care provider. You may do these exercises:  Walking or hiking.  Swimming.  Water aerobics.  Riding a stationary bike.  Strength training.  Modified yoga or Pilates. Tell your instructor that you are pregnant. Avoid overstretching, and avoid lying on your back for long periods of time.  Running or jogging. Only choose this type of exercise if you: ? Ran or jogged regularly before your pregnancy. ? Can run or jog and still talk in complete sentences. What exercises should I avoid? Depending on your level of fitness and whether you exercised regularly before your  pregnancy, you may be told to limit high-intensity exercise. You can tell that you are exercising at a high intensity if you are breathing much harder and faster and cannot hold a conversation while exercising. You must avoid:  Contact sports.  Activities that put you at risk for falling on or being hit in the belly, such as downhill skiing, water skiing, surfing, rock climbing, cycling, gymnastics, and horseback riding.  Scuba diving.  Skydiving.  Yoga or Pilates in a room that is heated to high temperatures.  Jogging or running, unless you ran or jogged regularly before your pregnancy. While jogging or running, you should always be able to talk in full sentences. Do not run or jog so fast that you are unable to have a conversation.  Do not exercise at more than 6,000 feet above sea level (high elevation) if you are not used to exercising at high elevation. How do I exercise in a safe way?   Avoid overheating. Do not exercise in very high temperatures.  Wear loose-fitting, breathable clothes.  Avoid dehydration. Drink enough water before, during, and after exercise to keep your urine pale yellow.  Avoid overstretching. Because of hormone changes during pregnancy, it is easy to overstretch muscles, tendons, and ligaments during pregnancy.  Start slowly and ask your health care provider to recommend the types of exercise that are safe for you.  Do not exercise to lose weight. Follow these instructions at home:  Exercise on most days or all days of the week. Try to exercise for 30 minutes a day, 5 days a week, unless your health care provider tells you not to.  If   you actively exercised before your pregnancy and you are healthy, your health care provider may tell you to continue to do moderate to high-intensity exercise.  If you are just starting to exercise or did not exercise much before your pregnancy, your health care provider may tell you to do low to moderate-intensity  exercise. Questions to ask your health care provider  Is exercise safe for me?  What are signs that I should stop exercising?  Does my health condition mean that I should not exercise during pregnancy?  When should I avoid exercising during pregnancy? Stop exercising and contact a health care provider if: You have any unusual symptoms, such as:  Mild contractions of the uterus or cramps in the abdomen.  Dizziness that does not go away when you rest. Stop exercising and get help right away if: You have any unusual symptoms, such as:  Sudden, severe pain in your low back or your belly.  Mild contractions of the uterus or cramps in the abdomen that do not improve with rest and drinking fluids.  Chest pain.  Bleeding or fluid leaking from your vagina.  Shortness of breath. These symptoms may represent a serious problem that is an emergency. Do not wait to see if the symptoms will go away. Get medical help right away. Call your local emergency services (911 in the [place] Do not drive yourself to the hospital. Summary  Most women should exercise regularly throughout pregnancy. In rare cases, women with certain medical conditions or complications may be asked to limit or avoid exercise during pregnancy.  Do not exercise to lose weight during pregnancy.  Your health care provider will tell you what level of physical activity is right for you.  Stop exercising and contact a health care provider if you have mild contractions of the uterus or cramps in the abdomen. Get help right away if these contractions or cramps do not improve with rest and drinking fluids.  Stop exercising and get help right away if you have sudden, severe pain in your low back or belly, chest pain, shortness of breath, or bleeding or leaking of fluid from your vagina. This information is not intended to replace advice given to you by your health care provider. Make sure you discuss any questions you have with your  health care provider. Document Revised: 09/08/2018 Document Reviewed: 06/22/2018 Elsevier Patient Education  2020 Elsevier Inc.  

## 2020-03-01 NOTE — Progress Notes (Signed)
  Routine Prenatal Care Visit  Subjective  Tiffany Conner is a 29 y.o. G2P1001 at [redacted]w[redacted]d being seen today for ongoing prenatal care.  She is currently monitored for the following issues for this low-risk pregnancy and has PSORIASIS; VSD; White coat syndrome without diagnosis of hypertension; Hemorrhoids; and Supervision of other normal pregnancy, antepartum on their problem list.  ----------------------------------------------------------------------------------- Patient reports no complaints.    . Vag. Bleeding: None.   . Leaking Fluid denies.  ----------------------------------------------------------------------------------- The following portions of the patient's history were reviewed and updated as appropriate: allergies, current medications, past family history, past medical history, past social history, past surgical history and problem list. Problem list updated.  Objective  Blood pressure 124/60, weight 182 lb (82.6 kg), last menstrual period 11/30/2019. Pregravid weight 168 lb (76.2 kg) Total Weight Gain 14 lb (6.35 kg) Urinalysis: Urine Protein Negative  Urine Glucose Negative  Fetal Status: Fetal Heart Rate (bpm): 162          NT scan: CRL 7.02, NT 2.1, Nasal bone present  General:  Alert, oriented and cooperative. Patient is in no acute distress.  Skin: Skin is warm and dry. No rash noted.   Cardiovascular: Normal heart rate noted  Respiratory: Normal respiratory effort, no problems with respiration noted  Abdomen: Soft, gravid, appropriate for gestational age.       Pelvic:  Cervical exam deferred        Extremities: Normal range of motion.     Mental Status: Normal mood and affect. Normal behavior. Normal judgment and thought content.   Assessment   29 y.o. G2P1001 at [redacted]w[redacted]d by  09/05/2020, by Last Menstrual Period presenting for routine prenatal visit  Plan   pregnancy 2 Problems (from 01/30/20 to present)    Problem Noted Resolved   Supervision of other normal  pregnancy, antepartum 01/30/2020 by Mirna Mires, CNM No   Overview Addendum 02/15/2020  1:22 PM by Mirna Mires, CNM     Nursing Staff Provider  Office Location  [facility] Dating   LMP = 10 Wk [place]  Language  English Anatomy US    Flu Vaccine   Genetic Screen  NIPS:   AFP:   First Screen:    TDaP vaccine    Hgb A1C or  GTT Early : Third trimester :   Rhogam     LAB RESULTS   Feeding Plan  Blood Type   A+  Contraception  Antibody  neg  Circumcision  Rubella  immune  Pediatrician   RPR   NR  Support Person  HBsAg   neg  Prenatal Classes  HIV  neg    Varicella   BTL Consent  GBS  (For PCN allergy, check sensitivities)        VBAC Consent  Pap  NIL 2021    Hgb Electro      CF      SMA                  Previous Version       Preterm labor symptoms and general obstetric precautions including but not limited to vaginal bleeding, contractions, leaking of fluid and fetal movement were reviewed in detail with the patient. Please refer to After Visit Summary for other counseling recommendations.   Return in about 4 weeks (around 03/29/2020) for rob.  Tresea Mall, CNM 03/01/2020 12:02 PM

## 2020-03-01 NOTE — Progress Notes (Signed)
ROB/US- no concerns/declines flu shot

## 2020-03-29 ENCOUNTER — Encounter: Payer: Self-pay | Admitting: Obstetrics & Gynecology

## 2020-03-29 ENCOUNTER — Ambulatory Visit (INDEPENDENT_AMBULATORY_CARE_PROVIDER_SITE_OTHER): Payer: BLUE CROSS/BLUE SHIELD | Admitting: Obstetrics & Gynecology

## 2020-03-29 ENCOUNTER — Other Ambulatory Visit: Payer: Self-pay

## 2020-03-29 VITALS — BP 120/80 | Wt 188.0 lb

## 2020-03-29 DIAGNOSIS — Z3A17 17 weeks gestation of pregnancy: Secondary | ICD-10-CM

## 2020-03-29 LAB — POCT URINALYSIS DIPSTICK OB
Glucose, UA: NEGATIVE
POC,PROTEIN,UA: NEGATIVE

## 2020-03-29 NOTE — Progress Notes (Signed)
  Subjective  Fetal Movement? yes Contractions? no Leaking Fluid? no Vaginal Bleeding? no  Objective  BP 120/80   Wt 188 lb (85.3 kg)   LMP 11/30/2019   BMI 30.34 kg/m  General: NAD Pumonary: no increased work of breathing Abdomen: gravid, non-tender Extremities: no edema Psychiatric: mood appropriate, affect full  Assessment  29 y.o. G2P1001 at [redacted]w[redacted]d by  09/05/2020, by Last Menstrual Period presenting for routine prenatal visit  Plan   Problem List Items Addressed This Visit    None    Visit Diagnoses    [redacted] weeks gestation of pregnancy    -  Primary   Relevant Orders   POC Urinalysis Dipstick OB (Completed)      pregnancy 2 Problems (from 01/30/20 to present)    Problem Noted Resolved   Supervision of other normal pregnancy, antepartum 01/30/2020 by Mirna Mires, CNM No   Overview Addendum 02/15/2020  1:22 PM by Mirna Mires, CNM     Nursing Staff Provider  Office Location  [facility] Dating   LMP = 10 Wk [place]  Language  English Anatomy US    Flu Vaccine   Genetic Screen  NIPS:   AFP:   First Screen:    TDaP vaccine    Hgb A1C or  GTT Early : Third trimester :   Rhogam     LAB RESULTS   Feeding Plan  Blood Type   A+  Contraception  Antibody  neg  Circumcision  Rubella  immune  Pediatrician   RPR   NR  Support Person  HBsAg   neg  Prenatal Classes  HIV  neg    Varicella   BTL Consent  GBS  (For PCN allergy, check sensitivities)        VBAC Consent  Pap  NIL 2021    Hgb Electro      CF      SMA                  Previous Version     PNV  [place] nv  Annamarie Major, MD, [facility], [facility] 03/29/2020  12:14 PM

## 2020-03-29 NOTE — Patient Instructions (Signed)
Thank you for choosing [facility]. As part of our ongoing efforts to improve patient experience, we would appreciate your feedback. Please fill out the short survey that you will receive by mail or MyChart. Your opinion is important to us! -Dr Phillippa Straub  Prenatal Ultrasound A prenatal ultrasound exam, also called a sonogram, is an imaging test that allows your health care provider to see your baby and placenta in the uterus. This is a safe and painless test that does not expose you or your baby to any X-rays, needles, or medicines. Prenatal ultrasounds are done using a handheld plastic device (transducer) that sends out sound waves (ultrasound). The sound waves reflect off your baby's bones and other tissues to create moving images on a computer screen. There are two types of prenatal ultrasound:  Transabdominal ultrasound. During this test, a transducer is placed on your belly and moved around. A routine transabdominal ultrasound is usually done between weeks 18 and 22 of pregnancy (standard ultrasound). It may also be done between weeks 13 and 14.  Transvaginal ultrasound. During this test, a transducer that is shaped like a wand is placed inside your vagina. This type of ultrasound is usually done during early pregnancy. Prenatal ultrasounds may be used to check:  How far along your pregnancy is (stage).  Your baby's development (gestational age).  The location and condition of the organ that supplies your baby with nourishment and oxygen (placenta).  Your baby's heart rate, position, and movements.  Your baby's approximate size and weight.  The amount of fluid surrounding your baby (amniotic fluid).  If you are carrying more than one baby.  Your baby's sex (if your baby is in a position that allows the sex organs to be seen, and if you choose to learn the sex at this time).  If there are any possible problems that require more testing, such as genetic problems.  If your pregnancy  is forming outside your uterus (ectopic pregnancy). You may have other ultrasounds as needed at any point during your pregnancy. If your health care provider suspects a problem, you may also have a more detailed type of transabdominal ultrasound (advanced ultrasound). What are the risks? Generally, this is a safe test. There are no known risks for you or your baby from a prenatal ultrasound. What happens before the test?  Before a transabdominal ultrasound, you may be asked to drink fluid 2 hours before the exam and avoid emptying your bladder. A full bladder helps the images show up more clearly.  Before a transvaginal ultrasound, you may be asked to empty your bladder before the exam.  Wear loose, comfortable clothing so it is easy to undress or expose your lower belly for the exam. What happens during the test? If you are having a transabdominal ultrasound:  You will lie on an exam table.  Your belly will be exposed.  Gel will be rubbed over your belly.  The transducer will be pressed on your belly and moved back and forth, through the gel. You may feel slight pressure, but there should not be any pain.  You may be asked to change your position.  You may hear sounds of blood flow and your baby's heartbeat. You may be able to see images of your baby on the computer screen. Your health care provider may measure your baby's head and other body parts, looking for normal development.  After the exam, the gel will be cleaned off, and you can replace your clothing. You will be   able to empty your bladder after the exam is done. If you are having a transvaginal ultrasound:  You will change into a hospital gown or undress from the waist down and cover yourself with a paper sheet.  You will lie down on an exam table with your feet in footrests (stirrups).  The transducer will be covered with a protective cover and lubricated.  The transducer will be inserted into your vagina.  You may  hear sounds of blood flow and your baby's heartbeat. You may be able to see images of your baby on the computer screen.  After the exam, the transducer will be removed, and you can put your clothes back on. What can I expect after the test?  You can drive yourself home and return to all your normal activities.  A health care provider trained in interpreting ultrasounds will review the images taken during your exam and send a report to your health care provider.  It is up to you to get your test results. Ask your health care provider, or the department that is doing the test, when your results will be ready. Questions to ask your health care provider  Why am I having this prenatal ultrasound?  What information will this exam provide?  How much does this exam cost? What costs will my insurance cover?  Can my partner or support person be with me during the exam?  When can I expect to get the results? Summary  A prenatal ultrasound is a safe and painless imaging exam that gives information about your pregnancy and your developing baby.  Transvaginal ultrasound exams are often done in early pregnancy. Standard transabdominal ultrasounds are typically done between 18 and 22 weeks of pregnancy. You may have other prenatal ultrasounds as needed.  This exam has no risks for you or your baby. After the exam, you can go home and return to all your usual activities. This information is not intended to replace advice given to you by your health care provider. Make sure you discuss any questions you have with your health care provider. Document Revised: 09/09/2018 Document Reviewed: 07/21/2017 Elsevier Patient Education  2020 [employer]vier [employer]  

## 2020-04-01 LAB — FIRST TRIMESTER SCREEN W/NT
CRL: 70.2 mm
DIA MoM: 1.15
DIA Value: 215.6 pg/mL
Gest Age-Collect: 13 weeks
Maternal Age At EDD: 29.1 yr
Nuchal Translucency MoM: 1.46
Nuchal Translucency: 2.1 mm
Number of Fetuses: 1
PAPP-A MoM: 1.84
PAPP-A Value: 1762.2 ng/mL
Test Results:: NEGATIVE
Weight: 182 [lb_av]
hCG MoM: 1.17
hCG Value: 89 IU/mL

## 2020-04-19 ENCOUNTER — Other Ambulatory Visit: Payer: BLUE CROSS/BLUE SHIELD

## 2020-04-19 ENCOUNTER — Encounter: Payer: BLUE CROSS/BLUE SHIELD | Admitting: Obstetrics & Gynecology

## 2020-05-06 ENCOUNTER — Other Ambulatory Visit: Payer: Self-pay

## 2020-05-06 ENCOUNTER — Other Ambulatory Visit: Payer: Self-pay | Admitting: Obstetrics and Gynecology

## 2020-05-06 ENCOUNTER — Ambulatory Visit (INDEPENDENT_AMBULATORY_CARE_PROVIDER_SITE_OTHER): Payer: BLUE CROSS/BLUE SHIELD | Admitting: Obstetrics and Gynecology

## 2020-05-06 ENCOUNTER — Ambulatory Visit (INDEPENDENT_AMBULATORY_CARE_PROVIDER_SITE_OTHER): Payer: BLUE CROSS/BLUE SHIELD

## 2020-05-06 VITALS — BP 112/62 | Wt 195.0 lb

## 2020-05-06 DIAGNOSIS — Z3A22 22 weeks gestation of pregnancy: Secondary | ICD-10-CM

## 2020-05-06 DIAGNOSIS — Z3689 Encounter for other specified antenatal screening: Secondary | ICD-10-CM

## 2020-05-06 DIAGNOSIS — Z3480 Encounter for supervision of other normal pregnancy, unspecified trimester: Secondary | ICD-10-CM

## 2020-05-06 LAB — POCT URINALYSIS DIPSTICK OB
Glucose, UA: NEGATIVE
POC,PROTEIN,UA: NEGATIVE

## 2020-05-06 NOTE — Progress Notes (Signed)
ROB - F/U ANAT [place]. RM 4

## 2020-05-06 NOTE — Progress Notes (Signed)
[id number] ROB - F/U ANAT [place]. RM 4

## 2020-05-06 NOTE — Progress Notes (Signed)
Routine Prenatal Care Visit  Subjective  Tiffany Conner is a 29 y.o. G2P1001 at [redacted]w[redacted]d being seen today for ongoing prenatal care.  She is currently monitored for the following issues for this low-risk pregnancy and has PSORIASIS; VSD; White coat syndrome without diagnosis of hypertension; Hemorrhoids; and Supervision of other normal pregnancy, antepartum on their problem list.  ----------------------------------------------------------------------------------- Patient reports no complaints.   Contractions: Not present. Vag. Bleeding: None.  Movement: Present. Denies leaking of fluid.  ----------------------------------------------------------------------------------- The following portions of the patient's history were reviewed and updated as appropriate: allergies, current medications, past family history, past medical history, past social history, past surgical history and problem list. Problem list updated.   Objective  Blood pressure 112/62, weight 195 lb (88.5 kg), last menstrual period 11/30/2019. Pregravid weight 168 lb (76.2 kg) Total Weight Gain 27 lb (12.2 kg) Urinalysis:      Fetal Status: Fetal Heart Rate (bpm): 150   Movement: Present     General:  Alert, oriented and cooperative. Patient is in no acute distress.  Skin: Skin is warm and dry. No rash noted.   Cardiovascular: Normal heart rate noted  Respiratory: Normal respiratory effort, no problems with respiration noted  Abdomen: Soft, gravid, appropriate for gestational age. Pain/Pressure: Absent     Pelvic:  Cervical exam deferred        Extremities: Normal range of motion.     ental Status: Normal mood and affect. Normal behavior. Normal judgment and thought content.    US OB Comp + 14 Wk  Result Date: 05/06/2020 Patient Name: Kathleene Bergemann DOB: [birth year] MRN: [id number] ULTRASOUND REPORT Location: [facility] Date of Service: 05/06/2020 Indications:Anatomy Ultrasound Findings: Mason Jim intrauterine  pregnancy is visualized with FHR at 161 BPM. Biometrics give an (U/S) Gestational age of [redacted]w[redacted]d and an (U/S) EDD of 09/06/2020; this correlates with the clinically established Estimated Date of Delivery: 09/05/20 Fetal presentation is Variable. EFW: 520 g ( 1 lb 2 oz ). Placenta: posterior. Grade: 1 AFI: subjectively normal. Anatomic survey is complete and normal; Gender - female.  Impression: 1. [redacted]w[redacted]d Viable Singleton Intrauterine pregnancy by U/S. 2. (U/S) EDD is consistent with Clinically established Estimated Date of Delivery: 09/05/20 . 3. Normal Anatomy Scan Recommendations: 1.Clinical correlation with the patient's History and Physical Exam. Deanna Artis, RT  There is a singleton gestation with subjectively normal amniotic fluid volume. The fetal biometry correlates with established dating. Detailed evaluation of the fetal anatomy was performed.The fetal anatomical survey appears within normal limits within the resolution of ultrasound as described above.  It must be noted that a normal ultrasound is unable to rule out fetal aneuploidy, subtle defects such as small ASD or VDS may also not be visible on imaging.  Vena Austria, MD, Evern Core [facility], [facility] 05/06/2020, 3:43 PM    Assessment   29 y.o. G2P1001 at [redacted]w[redacted]d by  09/05/2020, by Last Menstrual Period presenting for routine prenatal visit  Plan   pregnancy 2 Problems (from 01/30/20 to present)    Problem Noted Resolved   Supervision of other normal pregnancy, antepartum 01/30/2020 by Mirna Mires, CNM No   Overview Addendum 02/15/2020  1:22 PM by Mirna Mires, CNM     Nursing Staff Provider  Office Location  [facility]   LMP = 10 Wk [place]  Language  English Anatomy US  Normal  Flu Vaccine   Genetic Screen  Inheritest negative First Screen: negative   TDaP vaccine    Hgb A1C  or  GTT Early : Third trimester :   Rhogam     LAB RESULTS   Feeding Plan  Blood Type   A+  Contraception  Antibody  neg    Circumcision  Rubella  immune  Pediatrician   RPR   NR  Support Person  HBsAg   neg  Prenatal Classes  HIV  neg    Varicella   BTL Consent  GBS  (For PCN allergy, check sensitivities)        VBAC Consent  Pap  NIL 2021    Hgb Electro      CF      SMA                  Previous Version       Gestational age appropriate obstetric precautions including but not limited to vaginal bleeding, contractions, leaking of fluid and fetal movement were reviewed in detail with the patient.    - Normal anatomy scan today  Return in about 4 weeks (around 06/03/2020) for 4 week ROB, 6 week ROB and 28 week labs.  Vena Austria, MD, Evern Core [facility], [facility] 05/06/2020, 3:58 PM

## 2020-05-17 ENCOUNTER — Encounter: Payer: BLUE CROSS/BLUE SHIELD | Admitting: Obstetrics and Gynecology

## 2020-06-03 ENCOUNTER — Encounter: Payer: BLUE CROSS/BLUE SHIELD | Admitting: Obstetrics and Gynecology

## 2020-06-17 ENCOUNTER — Other Ambulatory Visit: Payer: BLUE CROSS/BLUE SHIELD

## 2020-06-17 ENCOUNTER — Encounter: Payer: BLUE CROSS/BLUE SHIELD | Admitting: Obstetrics & Gynecology

## 2020-06-21 ENCOUNTER — Other Ambulatory Visit: Payer: Self-pay

## 2020-06-21 ENCOUNTER — Encounter: Payer: Self-pay | Admitting: Obstetrics and Gynecology

## 2020-06-21 ENCOUNTER — Ambulatory Visit (INDEPENDENT_AMBULATORY_CARE_PROVIDER_SITE_OTHER): Payer: BLUE CROSS/BLUE SHIELD | Admitting: Obstetrics and Gynecology

## 2020-06-21 ENCOUNTER — Other Ambulatory Visit: Payer: BLUE CROSS/BLUE SHIELD

## 2020-06-21 VITALS — BP 120/80 | Wt 200.0 lb

## 2020-06-21 DIAGNOSIS — Z3A29 29 weeks gestation of pregnancy: Secondary | ICD-10-CM

## 2020-06-21 DIAGNOSIS — Z3480 Encounter for supervision of other normal pregnancy, unspecified trimester: Secondary | ICD-10-CM

## 2020-06-21 LAB — POCT URINALYSIS DIPSTICK OB
Glucose, UA: NEGATIVE
POC,PROTEIN,UA: NEGATIVE

## 2020-06-21 NOTE — Addendum Note (Signed)
Addended by: Cornelius Moras D on: 06/21/2020 03:11 PM   Modules accepted: Orders

## 2020-06-21 NOTE — Progress Notes (Signed)
Routine Prenatal Care Visit  Subjective  Tiffany Conner is a 30 y.o. G2P1001 at [redacted]w[redacted]d being seen today for ongoing prenatal care.  She is currently monitored for the following issues for this low-risk pregnancy and has PSORIASIS; VSD; White coat syndrome without diagnosis of hypertension; Hemorrhoids; and Supervision of other normal pregnancy, antepartum on their problem list.  ----------------------------------------------------------------------------------- Patient reports back pain today following fall this morning. Patient states she slipped on stairs, fell onto her butt and then subsequently hit her back. Patient states she has been applying heat to site and has taken tylenol with minimal relief. Patient declines further intervention for back pain at this time. Patient denies pregnancy concerns today. Contractions: Not present. Vag. Bleeding: None.  Movement: Present. Denies leaking of fluid.  ----------------------------------------------------------------------------------- The following portions of the patient's history were reviewed and updated as appropriate: allergies, current medications, past family history, past medical history, past social history, past surgical history and problem list. Problem list updated.   Objective  Blood pressure 120/80, weight 200 lb (90.7 kg), last menstrual period 11/30/2019. Pregravid weight 168 lb (76.2 kg) Total Weight Gain 32 lb (14.5 kg) Urinalysis:      Fetal Status: Fetal Heart Rate (bpm): 140 Fundal Height: 28 cm Movement: Present     General:  Alert, oriented and cooperative. Patient is in no acute distress.  Skin: Skin is warm and dry. No rash noted.   Cardiovascular: Normal heart rate noted  Respiratory: Normal respiratory effort, no problems with respiration noted  Abdomen: Soft, gravid, appropriate for gestational age. Pain/Pressure: Present     Pelvic:  Cervical exam deferred        Extremities: Normal range of motion.      ental Status: Normal mood and affect. Normal behavior. Normal judgment and thought content.     Assessment   30 y.o. G2P1001 at [redacted]w[redacted]d by  09/05/2020, by Last Menstrual Period presenting for routine prenatal visit  Plan   pregnancy 2 Problems (from 01/30/20 to present)    Problem Noted Resolved   Supervision of other normal pregnancy, antepartum 01/30/2020 by Mirna Mires, CNM No   Overview Addendum 06/21/2020  2:58 PM by Zipporah Plants, CNM     Nursing Staff Provider  Office Location  [facility]ating   LMP = 10 Wk [place]  Language  English Anatomy US  Normal  Flu Vaccine   declined Genetic Screen  Inheritest: negative   First Screen: negative   TDaP vaccine    Hgb A1C or  GTT Early : Third trimester :   Rhogam   n/a   LAB RESULTS   Feeding Plan  breast Blood Type   A+  Contraception  POPs Antibody  neg  Circumcision  n/a Rubella  immune  Pediatrician   RPR   NR  Support Person  Adam - husband HBsAg   neg  Prenatal Classes  HIV  neg    Varicella  immune  BTL Consent  n/a GBS  (For PCN allergy, check sensitivities)        VBAC Consent  n/a Pap  NIL 2021    Hgb Electro      CF  neg     SMA  neg                 Previous Version      -Third trimester labs and 1h GTT today. Reviewed process for GDM screening. -Discussed fall and back pain. Patient feels she is managing and coping with back  pain at this time. Reviewed S&S requiring further evaluation including decreased FM, LOF, ctx, or VB.  Preterm labor precautions including but not limited to vaginal bleeding, contractions, leaking of fluid and fetal movement were reviewed in detail with the patient.    Return in about 2 weeks (around 07/05/2020) for ROB.  Zipporah Plants, CNM, MSN [facility], [facility] 06/21/2020, 3:00 PM

## 2020-06-22 ENCOUNTER — Other Ambulatory Visit: Payer: Self-pay | Admitting: Obstetrics and Gynecology

## 2020-06-22 DIAGNOSIS — M549 Dorsalgia, unspecified: Secondary | ICD-10-CM

## 2020-06-22 LAB — 28 WEEK RH+PANEL
Basophils Absolute: 0 10*3/uL (ref 0.0–0.2)
Basos: 0 %
EOS (ABSOLUTE): 0.1 10*3/uL (ref 0.0–0.4)
Eos: 1 %
Gestational Diabetes Screen: 90 mg/dL (ref 65–139)
HIV Screen 4th Generation wRfx: NONREACTIVE
Hematocrit: 34.9 % (ref 34.0–46.6)
Hemoglobin: 12 g/dL (ref 11.1–15.9)
Immature Grans (Abs): 0.1 10*3/uL (ref 0.0–0.1)
Immature Granulocytes: 1 %
Lymphocytes Absolute: 2 10*3/uL (ref 0.7–3.1)
Lymphs: 21 %
MCH: 30.9 pg (ref 26.6–33.0)
MCHC: 34.4 g/dL (ref 31.5–35.7)
MCV: 90 fL (ref 79–97)
Monocytes Absolute: 0.7 10*3/uL (ref 0.1–0.9)
Monocytes: 7 %
Neutrophils Absolute: 6.6 10*3/uL (ref 1.4–7.0)
Neutrophils: 70 %
Platelets: 237 10*3/uL (ref 150–450)
RBC: 3.88 x10E6/uL (ref 3.77–5.28)
RDW: 11.7 % (ref 11.7–15.4)
RPR Ser Ql: NONREACTIVE
WBC: 9.5 10*3/uL (ref 3.4–10.8)

## 2020-06-22 MED ORDER — ACETAMINOPHEN-CODEINE #3 300-30 MG PO TABS: 1.0000 | ORAL_TABLET | ORAL | 0 refills | Status: AC | PRN

## 2020-06-24 ENCOUNTER — Telehealth: Payer: Self-pay

## 2020-06-24 NOTE — Telephone Encounter (Signed)
Pt aware per AMS to use icy hot or heat and to give it a few days.  Adv may try alternating between heating pad and ice pack q hour while awake.  Reminded pt not to use both icy hot and heat/ice as it may cause a burn.  Pt voices understanding.  Will call back if no improvement by mid week.

## 2020-06-24 NOTE — Telephone Encounter (Signed)
Pt calling; 29wks; fell Friday; hit lower back; on call rx'd tylenol c codeine; it's not helping much; now has shooting pain in lower back; hard to sit down, lay down, and stand up.   Has GFM, no bleeding or cramping.  Should she be seen or see her PCP to look at her back?  [phone]

## 2020-06-26 ENCOUNTER — Other Ambulatory Visit: Payer: Self-pay | Admitting: Obstetrics and Gynecology

## 2020-06-26 ENCOUNTER — Telehealth: Payer: Self-pay

## 2020-06-26 DIAGNOSIS — O99891 Other specified diseases and conditions complicating pregnancy: Secondary | ICD-10-CM

## 2020-06-26 MED ORDER — ACETAMINOPHEN-CODEINE #3 300-30 MG PO TABS: 1.0000 | ORAL_TABLET | ORAL | 0 refills | Status: DC | PRN

## 2020-06-26 NOTE — Telephone Encounter (Signed)
Pt want a refill on the Tylenol#3 states she still has back pain. Please advise

## 2020-06-26 NOTE — Progress Notes (Signed)
Patient requested refill for tylenol #3 for back pain in pregnancy after fall. She feel like pain is improving but she only has 3-4 pills left. I encouraged her to only take the medicine if needed and try and space out to every 6 hours. Discussed that pharmacy may not fill this prescription. rx for 10 additional pills sent.

## 2020-07-05 ENCOUNTER — Ambulatory Visit (INDEPENDENT_AMBULATORY_CARE_PROVIDER_SITE_OTHER): Payer: BLUE CROSS/BLUE SHIELD | Admitting: Obstetrics

## 2020-07-05 ENCOUNTER — Other Ambulatory Visit: Payer: Self-pay

## 2020-07-05 VITALS — BP 122/74 | Wt 200.0 lb

## 2020-07-05 DIAGNOSIS — Z3A31 31 weeks gestation of pregnancy: Secondary | ICD-10-CM

## 2020-07-05 DIAGNOSIS — Z3480 Encounter for supervision of other normal pregnancy, unspecified trimester: Secondary | ICD-10-CM

## 2020-07-05 NOTE — Progress Notes (Signed)
  Routine Prenatal Care Visit  Subjective  Tiffany Conner is a 30 y.o. G2P1001 at [redacted]w[redacted]d being seen today for ongoing prenatal care.  She is currently monitored for the following issues for this low-risk pregnancy and has PSORIASIS; VSD; White coat syndrome without diagnosis of hypertension; Hemorrhoids; and Supervision of other normal pregnancy, antepartum on their problem list.  ----------------------------------------------------------------------------------- Patient reports no complaints.  Her back continues to improve since her fall 2 wks ago. Contractions: Not present. Vag. Bleeding: None.  Movement: Present. Leaking Fluid denies.  ----------------------------------------------------------------------------------- The following portions of the patient's history were reviewed and updated as appropriate: allergies, current medications, past family history, past medical history, past social history, past surgical history and problem list. Problem list updated.  Objective  Blood pressure 122/74, weight 200 lb (90.7 kg), last menstrual period 11/30/2019. Pregravid weight 168 lb (76.2 kg) Total Weight Gain 32 lb (14.5 kg) Urinalysis: Urine Protein    Urine Glucose    Fetal Status:     Movement: Present     General:  Alert, oriented and cooperative. Patient is in no acute distress.  Skin: Skin is warm and dry. No rash noted.   Cardiovascular: Normal heart rate noted  Respiratory: Normal respiratory effort, no problems with respiration noted  Abdomen: Soft, gravid, appropriate for gestational age. Pain/Pressure: Absent     Pelvic:  Cervical exam deferred        Extremities: Normal range of motion.     Mental Status: Normal mood and affect. Normal behavior. Normal judgment and thought content.   Assessment   30 y.o. G2P1001 at [redacted]w[redacted]d by  09/05/2020, by Last Menstrual Period presenting for routine prenatal visit  Plan   pregnancy 2 Problems (from 01/30/20 to present)    Problem Noted  Resolved   Supervision of other normal pregnancy, antepartum 01/30/2020 by Mirna Mires, CNM No   Overview Addendum 06/21/2020  2:58 PM by Zipporah Plants, CNM     Nursing Staff Provider  Office Location  [facility]ating   LMP = 10 Wk [place]  Language  English Anatomy US  Normal  Flu Vaccine   Genetic Screen  Inheritest: negative   First Screen: negative   TDaP vaccine    Hgb A1C or  GTT Early : Third trimester :   Rhogam   n/a   LAB RESULTS   Feeding Plan  breast Blood Type   A+  Contraception  POPs Antibody  neg  Circumcision  n/a Rubella  immune  Pediatrician   RPR   NR  Support Person  Adam - husband HBsAg   neg  Prenatal Classes  HIV  neg    Varicella  immune  BTL Consent  n/a GBS  (For PCN allergy, check sensitivities)        VBAC Consent  n/a Pap  NIL 2021    Hgb Electro      CF  neg     SMA  neg                 Previous Version       Preterm labor symptoms and general obstetric precautions including but not limited to vaginal bleeding, contractions, leaking of fluid and fetal movement were reviewed in detail with the patient. Please refer to After Visit Summary for other counseling recommendations.   Return in about 2 weeks (around 07/19/2020) for return OB.  Mirna Mires, CNM  07/05/2020 4:41 PM

## 2020-07-05 NOTE — Progress Notes (Signed)
No vb no lof.  

## 2020-07-19 ENCOUNTER — Other Ambulatory Visit: Payer: Self-pay

## 2020-07-19 ENCOUNTER — Ambulatory Visit (INDEPENDENT_AMBULATORY_CARE_PROVIDER_SITE_OTHER): Payer: BLUE CROSS/BLUE SHIELD | Admitting: Obstetrics & Gynecology

## 2020-07-19 ENCOUNTER — Encounter: Payer: Self-pay | Admitting: Obstetrics & Gynecology

## 2020-07-19 VITALS — BP 100/70 | Wt 204.0 lb

## 2020-07-19 DIAGNOSIS — Z23 Encounter for immunization: Secondary | ICD-10-CM

## 2020-07-19 DIAGNOSIS — Z3A33 33 weeks gestation of pregnancy: Secondary | ICD-10-CM

## 2020-07-19 DIAGNOSIS — Z3480 Encounter for supervision of other normal pregnancy, unspecified trimester: Secondary | ICD-10-CM

## 2020-07-19 NOTE — Progress Notes (Signed)
  Subjective  Fetal Movement? yes Contractions? no Leaking Fluid? no Vaginal Bleeding? no  Objective  BP 100/70   Wt 204 lb (92.5 kg)   LMP 11/30/2019   BMI 32.93 kg/m  General: NAD Pumonary: no increased work of breathing Abdomen: gravid, non-tender Extremities: no edema Psychiatric: mood appropriate, affect full  Assessment  30 y.o. G2P1001 at [redacted]w[redacted]d by  09/05/2020, by Last Menstrual Period presenting for routine prenatal visit  Plan   Problem List Items Addressed This Visit      Other   Supervision of other normal pregnancy, antepartum    Other Visit Diagnoses    [redacted] weeks gestation of pregnancy    -  Primary   Need for diphtheria-tetanus-pertussis (Tdap) vaccine          pregnancy 2 Problems (from 01/30/20 to present)    Problem Noted Resolved   Supervision of other normal pregnancy, antepartum 01/30/2020 by Mirna Mires, CNM No   Overview Addendum 06/21/2020  2:58 PM by Zipporah Plants, CNM     Nursing Staff Provider  Office Location  [facility] Dating   LMP = 10 Wk [place]  Language  English Anatomy US  Normal  Flu Vaccine  decl Genetic Screen  Inheritest: negative   First Screen: negative   TDaP vaccine   today Hgb A1C or  GTT Early : Third trimester :   Rhogam   n/a   LAB RESULTS   Feeding Plan  breast Blood Type   A+  Contraception  POPs Antibody  neg  Circumcision  n/a Rubella  immune  Pediatrician   RPR   NR  Support Person  Adam - husband HBsAg   neg  Prenatal Classes  HIV  neg    Varicella  immune  BTL Consent  n/a GBS  (For PCN allergy, check sensitivities)        VBAC Consent  n/a Pap  NIL 2021    Hgb Electro      CF  neg     SMA  neg                 Previous Version     PNV, [facility]  TDaP today  Annamarie Major, MD, [facility], [facility] 07/19/2020  5:00 PM

## 2020-08-02 ENCOUNTER — Encounter: Payer: Self-pay | Admitting: Obstetrics & Gynecology

## 2020-08-02 ENCOUNTER — Other Ambulatory Visit: Payer: Self-pay

## 2020-08-02 ENCOUNTER — Ambulatory Visit (INDEPENDENT_AMBULATORY_CARE_PROVIDER_SITE_OTHER): Payer: BLUE CROSS/BLUE SHIELD | Admitting: Obstetrics & Gynecology

## 2020-08-02 VITALS — BP 122/80 | Wt 206.0 lb

## 2020-08-02 DIAGNOSIS — Z98891 History of uterine scar from previous surgery: Secondary | ICD-10-CM | POA: Insufficient documentation

## 2020-08-02 DIAGNOSIS — Z3483 Encounter for supervision of other normal pregnancy, third trimester: Secondary | ICD-10-CM

## 2020-08-02 DIAGNOSIS — Z3A35 35 weeks gestation of pregnancy: Secondary | ICD-10-CM

## 2020-08-02 LAB — POCT URINALYSIS DIPSTICK OB
Glucose, UA: NEGATIVE
POC,PROTEIN,UA: NEGATIVE

## 2020-08-02 NOTE — Progress Notes (Signed)
Subjective  Fetal Movement? yes Contractions? no Leaking Fluid? no Vaginal Bleeding? no  Objective  BP 122/80   Wt 206 lb (93.4 kg)   LMP 11/30/2019   BMI 33.25 kg/m  General: NAD Pumonary: no increased work of breathing Abdomen: gravid, non-tender Extremities: no edema Psychiatric: mood appropriate, affect full  Assessment  30 y.o. G2P1001 at [redacted]w[redacted]d by  09/05/2020, by Last Menstrual Period presenting for routine prenatal visit  Plan   Problem List Items Addressed This Visit      Other   Supervision of other normal pregnancy, antepartum   History of cesarean delivery    Other Visit Diagnoses    [redacted] weeks gestation of pregnancy    -  Primary   Relevant Orders   POC Urinalysis Dipstick OB      pregnancy 2 Problems (from 01/30/20 to present)    Problem Noted Resolved   Supervision of other normal pregnancy, antepartum 01/30/2020 by Mirna Mires, CNM No   Overview Addendum 08/02/2020  4:48 PM by Nadara Mustard, MD     Nursing Staff Provider  Office Location  [facility] Dating   LMP = 10 Wk [place]  Language  English Anatomy US  Normal  Flu Vaccine  decl Genetic Screen  Inheritest: negative   First Screen: negative   TDaP vaccine   07/2020 Hgb A1C or  GTT Early : Third trimester :   Rhogam   n/a   LAB RESULTS   Feeding Plan  breast Blood Type   A+  Contraception  POPs Antibody  neg  Circumcision  n/a Rubella  immune  Pediatrician   RPR   NR  Support Person  Adam - husband HBsAg   neg  Prenatal Classes  HIV  neg    Varicella  immune  BTL Consent  n/a GBS  (For PCN allergy, check sensitivities)        VBAC Consent  Desires VBAC Pap  NIL 2021   CS 2019 Hgb Electro      CF  neg     SMA  neg            OB/GYN  Counseling Note  30 y.o. G2P1001 at [redacted]w[redacted]d with Estimated Date of Delivery: 09/05/20 was seen today in office to discuss trial of labor after cesarean section (TOLAC) versus elective repeat cesarean delivery (ERCD). The following risks were discussed with the  patient.  Risk of uterine rupture at term is 0.78 percent with TOLAC and 0.22 percent with ERCD. 1 in 10 uterine ruptures will result in neonatal death or neurological injury. The benefits of a trial of labor after cesarean (TOLAC) resulting in a vaginal birth after cesarean (VBAC) include the following: shorter length of hospital stay and postpartum recovery (in most cases); fewer complications, such as postpartum fever, wound or uterine infection, thromboembolism (blood clots in the leg or lung), need for blood transfusion and fewer neonatal breathing problems.  The risks of an attempted VBAC or TOLAC include the following: Risk of failed trial of labor after cesarean (TOLAC) without a vaginal birth after cesarean (VBAC) resulting in repeat cesarean delivery (RCD) in about 20 to 40 percent of women who attempt VBAC.  Risk of rupture of uterus resulting in an emergency cesarean delivery. The risk of uterine rupture may be related in part to the type of uterine incision made during the first cesarean delivery. A previous transverse uterine incision has the lowest risk of rupture (0.2 to 1.5 percent risk). Vertical or T-shaped uterine  incisions have a higher risk of uterine rupture (4 to 9 percent risk)The risk of fetal death is very low with both VBAC and elective repeat cesarean delivery (ERCD), but the likelihood of fetal death is higher with VBAC than with ERCD. Maternal death is very rare with either type of delivery.  The risks of an elective repeat cesarean delivery (ERCD) were reviewed with the patient including but not limited to: 07/998 risk of uterine rupture which could have serious consequences, bleeding which may require transfusion; infection which may require antibiotics; injury to bowel, bladder or other surrounding organs (bowel, bladder, ureters); injury to the fetus; need for additional procedures including hysterectomy in the event of a life-threatening hemorrhage; thromboembolic  phenomenon; abnormal placentation; incisional problems; death and other postoperative or anesthesia complications.    Desires VBAC  Annamarie Major, MD, [facility], [facility] 08/02/2020  4:48 PM

## 2020-08-02 NOTE — Patient Instructions (Addendum)
Thank you for choosing [facility]. As part of our ongoing efforts to improve patient experience, we would appreciate your feedback. Please fill out the short survey that you will receive by mail or MyChart. Your opinion is important to [place]! -Dr Tiburcio Pea  Group B Streptococcus Infection During Pregnancy Group B Streptococcus (GBS) is a type of bacteria that is often found in healthy people. It is commonly found in the rectum, vagina, and intestines. In people who are healthy and not pregnant, the bacteria rarely cause serious illness or complications. However, women who test positive for GBS during pregnancy can pass the bacteria to the baby during childbirth. This can cause serious infection in the baby after birth. Women with GBS may also have infections during their pregnancy or soon after childbirth. The infections include urinary tract infections (UTIs) or infections of the uterus. GBS also increases a woman's risk of complications during pregnancy, such as early labor or delivery, miscarriage, or stillbirth. Routine testing for GBS is recommended for all pregnant women. What are the causes? This condition is caused by bacteria called Streptococcus agalactiae. What increases the risk? You may have a higher risk for GBS infection during pregnancy if you had one during a past pregnancy. What are the signs or symptoms? In most cases, GBS infection does not cause symptoms in pregnant women. If symptoms exist, they may include:  Labor that starts before the 37th week of pregnancy.  A UTI or bladder infection. This may cause a fever, frequent urination, or pain and burning during urination.  Fever during labor. There can also be a rapid heartbeat in the mother or baby. Rare but serious symptoms of a GBS infection in women include:  Blood infection (septicemia). This may cause fever, chills, or confusion.  Lung infection (pneumonia). This may cause fever, chills, cough, rapid breathing, chest  pain, or difficulty breathing.  Bone, joint, skin, or soft tissue infection. How is this diagnosed? You may be screened for GBS between week 35 and week 37 of pregnancy. If you have symptoms of preterm labor, you may be screened earlier. This condition is diagnosed based on lab test results from:  A swab of fluid from the vagina and rectum.  A urine sample. How is this treated? This condition is treated with antibiotic medicine. Antibiotic medicine may be given:  To you when you go into labor, or as soon as your water breaks. The medicines will continue until after you give birth. If you are having a cesarean delivery, you do not need antibiotics unless your water has broken.  To your baby, if he or she requires treatment. Your health care provider will check your baby to decide if he or she needs antibiotics to prevent a serious infection.   Follow these instructions at home:  Take over-the-counter and prescription medicines only as told by your health care provider.  Take your antibiotic medicine as told by your health care provider. Do not stop taking the antibiotic even if you start to feel better.  Keep all pre-birth (prenatal) visits and follow-up visits as told by your health care provider. This is important. Contact a health care provider if:  You have pain or burning when you urinate.  You have to urinate more often than usual.  You have a fever or chills.  You develop a bad-smelling vaginal discharge. Get help right away if:  Your water breaks.  You go into labor.  You have severe pain in your abdomen.  You have difficulty breathing.  You have chest pain. These symptoms may represent a serious problem that is an emergency. Do not wait to see if the symptoms will go away. Get medical help right away. Call your local emergency services (911 in the [place] Do not drive yourself to the hospital. Summary  GBS is a type of bacteria that is common in healthy  people.  During pregnancy, colonization with GBS can cause serious complications for you or your baby.  Your health care provider will screen you between 35 and 37 weeks of pregnancy to determine if you are colonized with GBS.  If you are colonized with GBS during pregnancy, your health care provider will recommend antibiotics through an IV during labor.  After delivery, your baby will be evaluated for complications related to potential GBS infection and may require antibiotics to prevent a serious infection. This information is not intended to replace advice given to you by your health care provider. Make sure you discuss any questions you have with your health care provider. Document Revised: 03/19/2020 Document Reviewed: 12/12/2018 Elsevier Patient Education  2021 Elsevier Inc.  

## 2020-08-09 ENCOUNTER — Other Ambulatory Visit: Payer: Self-pay

## 2020-08-09 ENCOUNTER — Encounter: Payer: Self-pay | Admitting: Advanced Practice Midwife

## 2020-08-09 ENCOUNTER — Ambulatory Visit (INDEPENDENT_AMBULATORY_CARE_PROVIDER_SITE_OTHER): Payer: BLUE CROSS/BLUE SHIELD | Admitting: Advanced Practice Midwife

## 2020-08-09 ENCOUNTER — Other Ambulatory Visit (HOSPITAL_COMMUNITY)
Admission: RE | Admit: 2020-08-09 | Discharge: 2020-08-09 | Disposition: A | Discharge status: Home / Self Care | Payer: BLUE CROSS/BLUE SHIELD | Source: Ambulatory Visit | Attending: Advanced Practice Midwife | Admitting: Advanced Practice Midwife

## 2020-08-09 VITALS — BP 130/66 | Wt 207.0 lb

## 2020-08-09 DIAGNOSIS — Z3A36 36 weeks gestation of pregnancy: Secondary | ICD-10-CM

## 2020-08-09 DIAGNOSIS — Z113 Encounter for screening for infections with a predominantly sexual mode of transmission: Secondary | ICD-10-CM | POA: Diagnosis present

## 2020-08-09 DIAGNOSIS — Z369 Encounter for antenatal screening, unspecified: Secondary | ICD-10-CM | POA: Diagnosis present

## 2020-08-09 DIAGNOSIS — Z3483 Encounter for supervision of other normal pregnancy, third trimester: Secondary | ICD-10-CM | POA: Insufficient documentation

## 2020-08-09 DIAGNOSIS — Z3685 Encounter for antenatal screening for Streptococcus B: Secondary | ICD-10-CM

## 2020-08-09 LAB — POCT URINALYSIS DIPSTICK OB
Glucose, UA: NEGATIVE
POC,PROTEIN,UA: NEGATIVE

## 2020-08-09 NOTE — Progress Notes (Signed)
Routine Prenatal Care Visit  Subjective  Tiffany Conner is a 30 y.o. G2P1001 at [redacted]w[redacted]d being seen today for ongoing prenatal care.  She is currently monitored for the following issues for this low-risk pregnancy and has PSORIASIS; VSD; Hemorrhoids; Supervision of other normal pregnancy, antepartum; and History of cesarean delivery on their problem list.  ----------------------------------------------------------------------------------- Patient reports she is doing well. She has some varicose veins on her thighs. She prefers a c/s if no labor by 40-41 weeks. Recommend she discuss with MD at next visit.   Contractions: Irritability. Vag. Bleeding: None.  Movement: Present. Leaking Fluid denies.  ----------------------------------------------------------------------------------- The following portions of the patient's history were reviewed and updated as appropriate: allergies, current medications, past family history, past medical history, past social history, past surgical history and problem list. Problem list updated.  Objective  Blood pressure 130/66, weight 207 lb (93.9 kg), last menstrual period 11/30/2019. Pregravid weight 168 lb (76.2 kg) Total Weight Gain 39 lb (17.7 kg) Urinalysis: Urine Protein Negative  Urine Glucose Negative  Fetal Status: Fetal Heart Rate (bpm): 120 Fundal Height: 37 cm Movement: Present     General:  Alert, oriented and cooperative. Patient is in no acute distress.  Skin: Skin is warm and dry. No rash noted.   Cardiovascular: Normal heart rate noted  Respiratory: Normal respiratory effort, no problems with respiration noted  Abdomen: Soft, gravid, appropriate for gestational age. Pain/Pressure: Absent     Pelvic:  GBS/aptima collected Dilation: Closed    vulvar varicosity left labia  Extremities: Normal range of motion. Varicose veins superficial on bilateral thighs. Edema: Trace  Mental Status: Normal mood and affect. Normal behavior. Normal judgment and  thought content.   Assessment   30 y.o. G2P1001 at [redacted]w[redacted]d by  09/05/2020, by Last Menstrual Period presenting for routine prenatal visit  Plan   pregnancy 2 Problems (from 01/30/20 to present)    Problem Noted Resolved   History of cesarean delivery 08/02/2020 by Nadara Mustard, MD No   Overview Signed 08/02/2020  4:47 PM by Nadara Mustard, MD    OB/GYN  Counseling Note  30 y.o. G2P1001 at [redacted]w[redacted]d with Estimated Date of Delivery: 09/05/20 was seen today in office to discuss trial of labor after cesarean section (TOLAC) versus elective repeat cesarean delivery (ERCD). The following risks were discussed with the patient.  Risk of uterine rupture at term is 0.78 percent with TOLAC and 0.22 percent with ERCD. 1 in 10 uterine ruptures will result in neonatal death or neurological injury. The benefits of a trial of labor after cesarean (TOLAC) resulting in a vaginal birth after cesarean (VBAC) include the following: shorter length of hospital stay and postpartum recovery (in most cases); fewer complications, such as postpartum fever, wound or uterine infection, thromboembolism (blood clots in the leg or lung), need for blood transfusion and fewer neonatal breathing problems.  The risks of an attempted VBAC or TOLAC include the following: Risk of failed trial of labor after cesarean (TOLAC) without a vaginal birth after cesarean (VBAC) resulting in repeat cesarean delivery (RCD) in about 20 to 40 percent of women who attempt VBAC.  Risk of rupture of uterus resulting in an emergency cesarean delivery. The risk of uterine rupture may be related in part to the type of uterine incision made during the first cesarean delivery. A previous transverse uterine incision has the lowest risk of rupture (0.2 to 1.5 percent risk). Vertical or T-shaped uterine incisions have a higher risk of uterine rupture (4 to 9 percent  risk)The risk of fetal death is very low with both VBAC and elective repeat cesarean delivery (ERCD),  but the likelihood of fetal death is higher with VBAC than with ERCD. Maternal death is very rare with either type of delivery.  The risks of an elective repeat cesarean delivery (ERCD) were reviewed with the patient including but not limited to: 07/998 risk of uterine rupture which could have serious consequences, bleeding which may require transfusion; infection which may require antibiotics; injury to bowel, bladder or other surrounding organs (bowel, bladder, ureters); injury to the fetus; need for additional procedures including hysterectomy in the event of a life-threatening hemorrhage; thromboembolic phenomenon; abnormal placentation; incisional problems; death and other postoperative or anesthesia complications.    Desires VBAC      Supervision of other normal pregnancy, antepartum 01/30/2020 by Mirna Mires, CNM No   Overview Addendum 08/02/2020  4:48 PM by Nadara Mustard, MD     Nursing Staff Provider  Office Location  [facility] Dating   LMP = 10 Wk [place]  Language  English Anatomy US  Normal  Flu Vaccine  decl Genetic Screen  Inheritest: negative   First Screen: negative   TDaP vaccine   07/2020 Hgb A1C or  GTT Early : Third trimester :   Rhogam   n/a   LAB RESULTS   Feeding Plan  breast Blood Type   A+  Contraception  POPs Antibody  neg  Circumcision  n/a Rubella  immune  Pediatrician   RPR   NR  Support Person  Adam - husband HBsAg   neg  Prenatal Classes  HIV  neg    Varicella  immune  BTL Consent  n/a GBS  (For PCN allergy, check sensitivities)        VBAC Consent  Desires VBAC Pap  NIL 2021   CS 2019 Hgb Electro      CF  neg     SMA  neg                 Previous Version       Preterm labor symptoms and general obstetric precautions including but not limited to vaginal bleeding, contractions, leaking of fluid and fetal movement were reviewed in detail with the patient. Please refer to After Visit Summary for other counseling recommendations.   Return in  about 1 week (around 08/16/2020) for rob.  Tresea Mall, CNM 08/09/2020 4:22 PM

## 2020-08-09 NOTE — Patient Instructions (Signed)
Vaginal Birth After Cesarean Delivery  Vaginal birth after cesarean delivery (VBAC) is giving birth vaginally after previously delivering a baby through a cesarean section (C-section). A VBAC may be a safe option for you, depending on your health and other factors. It is important to discuss VBAC with your health care provider early in your pregnancy so you can understand the risks, benefits, and options. Having these discussions early will give you time to make your birth plan. Who are the best candidates for VBAC? The best candidates for VBAC are women who:  Have had one or two prior cesarean deliveries, and the incision made during the delivery was horizontal (low transverse).  Do not have a vertical (classical) scar on their uterus.  Have not had a tear in the wall of their uterus (uterine rupture).  Plan to have more pregnancies. A VBAC is also more likely to be successful:  In women who have previously given birth vaginally.  When labor starts by itself (spontaneously) before the due date. What are the benefits of VBAC? The benefits of delivering your baby vaginally instead of by a cesarean delivery include:  A shorter hospital stay.  A faster recovery time.  Less pain.  Avoiding risks associated with major surgery, such as infection and blood clots.  Less blood loss and less need for donated blood (transfusions). What are the risks of VBAC? The main risk of attempting a VBAC is that it may fail, forcing your health care provider to deliver your baby by a C-section. Other risks are rare and include:  Tearing (rupture) of the scar from a past cesarean delivery.  Other risks associated with vaginal deliveries. If a repeat cesarean delivery is needed, the risks include:  Blood loss.  Infection.  Blood clot.  Damage to surrounding organs.  Removal of the uterus (hysterectomy), if it is damaged.  Placenta problems in future pregnancies. What else should I know  about my options? Delivering a baby through a VBAC is similar to having a normal spontaneous vaginal delivery. Therefore, it is safe:  To try with twins.  For your health care provider to try to turn the baby from a breech position (external cephalic version) during labor.  With epidural analgesia for pain relief. Consider where you would like to deliver your baby. VBAC should be attempted in facilities where an emergency cesarean delivery can be performed. VBAC is not recommended for home births. Any changes in your health or your baby's health during your pregnancy may make it necessary to change your initial decision about VBAC. Your health care provider may recommend that you do not attempt a VBAC if:  Your baby's suspected weight is 8.8 lb (4 kg) or more.  You have preeclampsia. This is a condition that causes high blood pressure along with other symptoms, such as swelling and headaches.  You will have VBAC less than 19 months after your cesarean delivery.  You are past your due date.  You need to have labor started (induced) because your cervix is not ready for labor (unfavorable). Where to find more information  American Pregnancy Association: americanpregnancy.org  American Congress of Obstetricians and Gynecologists: acog.org Summary  Vaginal birth after cesarean delivery (VBAC) is giving birth vaginally after previously delivering a baby through a cesarean section (C-section). A VBAC may be a safe option for you, depending on your health and other factors.  Discuss VBAC with your health care provider early in your pregnancy so you can understand the risks, benefits, options, and   have plenty of time to make your birth plan.  The main risk of attempting a VBAC is that it may fail, forcing your health care provider to deliver your baby by a C-section. Other risks are rare. This information is not intended to replace advice given to you by your health care provider. Make sure  you discuss any questions you have with your health care provider. Document Revised: 09/13/2018 Document Reviewed: 08/25/2016 Elsevier Patient Education  2021 Elsevier Inc.  

## 2020-08-09 NOTE — Progress Notes (Signed)
poc

## 2020-08-11 LAB — STREP GP B NAA: Strep Gp B NAA: POSITIVE — AB

## 2020-08-13 LAB — CERVICOVAGINAL ANCILLARY ONLY
Chlamydia: NEGATIVE
Comment: NEGATIVE
Comment: NEGATIVE
Comment: NORMAL
Neisseria Gonorrhea: NEGATIVE
Trichomonas: NEGATIVE

## 2020-08-15 NOTE — Progress Notes (Signed)
Routine Prenatal Care Visit  Delayed chart closure.  Subjective  Tiffany Conner is a 30 y.o. G2P1001 at [redacted]w[redacted]d being seen today for ongoing prenatal care.  She is currently monitored for the following issues for this low-risk pregnancy and has PSORIASIS; VSD; Hemorrhoids; Supervision of other normal pregnancy, antepartum; and History of cesarean delivery on their problem list.  ----------------------------------------------------------------------------------- Patient reports no complaints.   Reports normal fetal movement. Denies contractions. Denies vaginal bleeding.  Denies leaking of fluid.  ----------------------------------------------------------------------------------- The following portions of the patient's history were reviewed and updated as appropriate: allergies, current medications, past family history, past medical history, past social history, past surgical history and problem list. Problem list updated.   Objective  Blood pressure 130/68, weight 200 lb (90.7 kg), last menstrual period 06/12/2017, not currently breastfeeding. Pregravid weight 171 lb (77.6 kg) Total Weight Gain 29 lb (13.2 kg) Urinalysis:      Fetal Status: heart tones present  General:  Alert, oriented and cooperative. Patient is in no acute distress.  Skin: Skin is warm and dry. No rash noted.   Cardiovascular: Normal heart rate noted  Respiratory: Normal respiratory effort, no problems with respiration noted  Abdomen: Soft, gravid, appropriate for gestational age. Pain/Pressure: Absent     Pelvic:  Cervical exam deferred        Extremities: Normal range of motion.     Mental Status: Normal mood and affect. Normal behavior. Normal judgment and thought content.     Assessment   30 y.o. G2P1001 at [redacted]w[redacted]d by  03/19/2018, by Last Menstrual Period presenting for routine prenatal visit  Plan   FIRST Problems (from 08/06/17 to 03/03/18)    Problem Noted Resolved   Intrauterine growth restriction  (IUGR) affecting care of mother, third trimester, single gestation 01/25/2018 by Natale Milch, MD 04/21/2018 by Natale Milch, MD   Overview Signed 01/25/2018 10:33 AM by Natale Milch, MD    Growth [place] every 3-4 weeks  [ ]  34  [ ] 38  Weekly UA dopplers and Fluid Twice weekly NSTs after 32 weeks      White coat syndrome without diagnosis of hypertension 09/30/2017 by , MD 08/09/2020 by Vena Austria, CNM   Overview Signed 09/30/2017  9:28 PM by Tresea Mall, MD    [X]  Aspirin 81 mg daily after 12 weeks; discontinue after 36 weeks [X]  baseline labs with CBC, CMP, urine protein/creatinine ratio [ ]  no BP meds unless BPs become elevated [ ]  ultrasound for growth at 28, 32, 36 weeks    Current antihypertensives:  None   Baseline and surveillance labs (pulled in from [facility], refresh links as needed)  Lab Results  Component Value Date   PLT 270 08/06/2017   CREATININE 0.57 09/13/2017   AST 18 09/13/2017   ALT 15 09/13/2017    Antenatal Testing CHTN - O10.919  Group I  BP < 140/90, no preeclampsia, AGA,  nml AFV, +/- meds    Group II BP > 140/90, on meds, no preeclampsia, AGA, nml AFV  20-28-34-38  20-24-28-32-35-38  32//2 x wk  28//BPP wkly then 32//2 x wk  40 no meds; 39 meds  PRN or 37  Pre-eclampsia  GHTN - O13.9/Preeclampsia without severe features  - O14.00   Preeclampsia with severe features - O14.10  Q 3-4wks  Q 2 wks  28//BPP wkly then 32//2 x wk  Inpatient  37  PRN or 34        Supervision of high risk pregnancy, antepartum,  third trimester 08/06/2017 by Oswaldo Conroy, CNM 04/21/2018 by Natale Milch, MD   Overview Addendum 02/10/2018  3:36 PM by Oswaldo Conroy, CNM    Clinic [facility] Prenatal Labs  Dating LMP = 9week [place] Blood type: A/Positive/-- (03/08 1446)   Genetic Screen 1 Screen: negative Antibody:Negative (03/08 1446)  Anatomic [place] Normal Female Rubella: 1.47 (03/08 1446) Varicella: Immune  GTT 90  RPR: Non Reactive (03/08 1446)   Rhogam Not applicable HBsAg: Negative (03/08 1446)   TDaP vaccine 01/19/2018                    HIV: Non Reactive (03/08 1446)   Baby Food                                GBS:   Contraception  Pap: 11/02/2016, NILM  CBB     CS/VBAC N/A   Support Person Adam (Husband)             Previous Version       Gestational age appropriate obstetric precautions including but not limited to vaginal bleeding, contractions, leaking of fluid and fetal movement were reviewed in detail with the patient.    Return for schedule for twice a week visits for the rest of pregnancy ( 7 weeks).  Natale Milch MD [facility], [facility] 08/15/2020, 9:08 AM

## 2020-08-16 ENCOUNTER — Ambulatory Visit (INDEPENDENT_AMBULATORY_CARE_PROVIDER_SITE_OTHER): Payer: BLUE CROSS/BLUE SHIELD | Admitting: Obstetrics and Gynecology

## 2020-08-16 ENCOUNTER — Other Ambulatory Visit: Payer: Self-pay

## 2020-08-16 VITALS — BP 122/76 | Wt 209.0 lb

## 2020-08-16 DIAGNOSIS — Z3483 Encounter for supervision of other normal pregnancy, third trimester: Secondary | ICD-10-CM

## 2020-08-16 DIAGNOSIS — Z98891 History of uterine scar from previous surgery: Secondary | ICD-10-CM

## 2020-08-16 DIAGNOSIS — Z3480 Encounter for supervision of other normal pregnancy, unspecified trimester: Secondary | ICD-10-CM

## 2020-08-16 DIAGNOSIS — Z3A37 37 weeks gestation of pregnancy: Secondary | ICD-10-CM

## 2020-08-16 LAB — POCT URINALYSIS DIPSTICK OB
Glucose, UA: NEGATIVE
POC,PROTEIN,UA: NEGATIVE

## 2020-08-16 NOTE — Patient Instructions (Signed)
How bad does labor hurt? Labor and birth are different for each person. The amount of pain and where you feel pain the most changes during labor. Even if you have had a baby before, labor pain can be different with each baby. Nobody can know ahead of time how difficult or painful their labor will be.  Why does labor hurt? During labor, your uterus (womb) pushes your baby down and stretches your cervix (the opening of your uterus). When your uterus has a contraction (muscles get tight), you feel pain that is like a strong cramp in your abdomen or lower back. Labor pain usually feels like the cramps you feel during your period. As labor goes on and the cervix gets more stretched, the cramping pain usually gets worse. Most contractions last 30 to 60 seconds, and you will be able to rest in between each one.  How can I decide before labor starts what will be the best way for me to cope with labor pain? Take childbirth classes. The more you know, the less you fear. Being afraid and worried makes pain hurt more. Knowing what to expect will help you cope better with labor pain. Learn about the different medication and non-medication options for coping with pain that are used where you are going to give birth.  What can I do to prepare for labor? Stay physically active during pregnancy. You will have more strength to get through labor. Also, women who are in good physical shape often have shorter labors. Find a support person or doula to be with you during labor. Having a person whose only job is to be with you and support you during labor will help you cope better. Talk with your support persons and caregivers about your ideas for what will help you cope with labor pain so that you can trust them to help you make decisions that are right for you when you are in labor.  What can I do to cope during early labor? Stay up and out of bed. Walking and being on your feet can help your contractions work better but  feel less painful. Do something you enjoy. This will help you keep your mind off the pain. Drink lots of fluids so you don't get dehydrated. Eat lightly if you are hungry. Take a warm shower or bath. Water often makes your contractions easier to handle.  What can I do to cope during active labor? Women who cope well during labor rock or move and sometimes make a noise like groaning during contractions. Then they rest between contractions. Going back and forth between moving and resting in a regular rhythm helps them cope with labor as it progresses. Each person has their own rhythm that works. Here are some ideas to help you find your rhythm during labor:  Between contractions Rest by being still or by rocking gently. Focus on your breathing in and breathing out. When you pay close attention to your breathing, your body can relax. Relax your muscles, especially your shoulders and toes. Those muscles often get tight during contractions. Move or rock your hips to relax those muscles. Change positions often. Listen to music that soothes you. This may help you relax and keep your mind off the pain. During contractions Get in a tub or shower. Water can help labor feel less painful. Make noise. You can moan, hum, or repeat comforting words over and over as you go through each contraction. Some women find different breathing patterns taught in childbirth   classes helpful. Move back and forth or rock your hips in whatever way helps you cope as the contraction gets more painful and then gets less painful as it goes away.  What can my support person do to help me cope with labor pain? Help you find your rhythm and then help you keep making the same noise and movements during a contraction and doing the same relaxing things between contractions. Hold your hand quietly. Touch or rub your back, hands, or feet if being touched is something you like. Offer you juice, water, or ice chips between  contractions. Help you change positions and support your body. Make the room you are in comforting. Keep the lights low, play soft music, and have something to look at that relaxes you or makes you happy. Put a cold washcloth on your forehead or neck. Put a heating pad or warm washcloth on your lower back. Talk to you and remind you that you are strong.  What can my health care provider do to help me cope with labor pain? Answer your questions. Check your progress and give you information about how the labor is going. Provide information about different ways to help you cope with labor pain.   For More Information Childbirth Connection: Comfort in Labor https://www.nationalpartnership.org/our-work/resources/health-care/maternity/comfort-in-labor-simkin.pdf  Childbirth Connection: Labor Pain http://www.childbirthconnection.org/giving-birth/labor-pain/  [facility]: Coping with Labor Pain without Using Medicines https://my.[facility].org/health/articles/15586-labor-without-medication-coping-skills  [facility]: Choosing How You Want to Cope with Labor Pain https://www.[facility].org/healthy-lifestyle/labor-and-delivery/in-depth/labor-pain/art-20044845  [facility]: Medicines for Labor Pain https://www.[facility].org/healthy-lifestyle/labor-and-delivery/in-depth/labor-and-delivery/art-20049326  Penny Simkin: Relaxation, Rhythm, and Ritual: The 3 Rs of Childbirth https://www.pennysimkin.com/three-rs/  

## 2020-08-16 NOTE — Progress Notes (Signed)
Routine Prenatal Care Visit  Subjective  Tiffany Conner is a 30 y.o. G2P1001 at [redacted]w[redacted]d being seen today for ongoing prenatal care.  She is currently monitored for the following issues for this low-risk pregnancy and has PSORIASIS; VSD; Hemorrhoids; Supervision of other normal pregnancy, antepartum; and History of cesarean delivery on their problem list.  ----------------------------------------------------------------------------------- Patient reports no complaints.   Contractions: Irritability. Vag. Bleeding: None.  Movement: Present. Denies leaking of fluid.  ----------------------------------------------------------------------------------- The following portions of the patient's history were reviewed and updated as appropriate: allergies, current medications, past family history, past medical history, past social history, past surgical history and problem list. Problem list updated.   Objective  Blood pressure 122/76, weight 209 lb (94.8 kg), last menstrual period 11/30/2019. Pregravid weight 168 lb (76.2 kg) Total Weight Gain 41 lb (18.6 kg) Urinalysis:      Fetal Status: Fetal Heart Rate (bpm): 135 Fundal Height: 38 cm Movement: Present  Presentation: Vertex  General:  Alert, oriented and cooperative. Patient is in no acute distress.  Skin: Skin is warm and dry. No rash noted.   Cardiovascular: Normal heart rate noted  Respiratory: Normal respiratory effort, no problems with respiration noted  Abdomen: Soft, gravid, appropriate for gestational age. Pain/Pressure: Absent     Pelvic:  Cervical exam deferred        Extremities: Normal range of motion.     ental Status: Normal mood and affect. Normal behavior. Normal judgment and thought content.     Assessment   30 y.o. G2P1001 at [redacted]w[redacted]d by  09/05/2020, by Last Menstrual Period presenting for routine prenatal visit  Plan   pregnancy 2 Problems (from 01/30/20 to present)    Problem Noted Resolved   History of cesarean  delivery 08/02/2020 by Nadara Mustard, MD No   Overview Signed 08/02/2020  4:47 PM by Nadara Mustard, MD    OB/GYN  Counseling Note  30 y.o. G2P1001 at [redacted]w[redacted]d with Estimated Date of Delivery: 09/05/20 was seen today in office to discuss trial of labor after cesarean section (TOLAC) versus elective repeat cesarean delivery (ERCD). The following risks were discussed with the patient.  Risk of uterine rupture at term is 0.78 percent with TOLAC and 0.22 percent with ERCD. 1 in 10 uterine ruptures will result in neonatal death or neurological injury. The benefits of a trial of labor after cesarean (TOLAC) resulting in a vaginal birth after cesarean (VBAC) include the following: shorter length of hospital stay and postpartum recovery (in most cases); fewer complications, such as postpartum fever, wound or uterine infection, thromboembolism (blood clots in the leg or lung), need for blood transfusion and fewer neonatal breathing problems.  The risks of an attempted VBAC or TOLAC include the following: Risk of failed trial of labor after cesarean (TOLAC) without a vaginal birth after cesarean (VBAC) resulting in repeat cesarean delivery (RCD) in about 20 to 40 percent of women who attempt VBAC.  Risk of rupture of uterus resulting in an emergency cesarean delivery. The risk of uterine rupture may be related in part to the type of uterine incision made during the first cesarean delivery. A previous transverse uterine incision has the lowest risk of rupture (0.2 to 1.5 percent risk). Vertical or T-shaped uterine incisions have a higher risk of uterine rupture (4 to 9 percent risk)The risk of fetal death is very low with both VBAC and elective repeat cesarean delivery (ERCD), but the likelihood of fetal death is higher with VBAC than with ERCD. Maternal death  is very rare with either type of delivery.  The risks of an elective repeat cesarean delivery (ERCD) were reviewed with the patient including but not limited  to: 07/998 risk of uterine rupture which could have serious consequences, bleeding which may require transfusion; infection which may require antibiotics; injury to bowel, bladder or other surrounding organs (bowel, bladder, ureters); injury to the fetus; need for additional procedures including hysterectomy in the event of a life-threatening hemorrhage; thromboembolic phenomenon; abnormal placentation; incisional problems; death and other postoperative or anesthesia complications.    Desires VBAC      Supervision of other normal pregnancy, antepartum 01/30/2020 by Mirna Mires, CNM No   Overview Addendum 08/16/2020 10:29 AM by Zipporah Plants, CNM     Nursing Staff Provider  Office Location  [facility] Dating   LMP = 10 Wk [place]  Language  English Anatomy US  Normal  Flu Vaccine  decl Genetic Screen  Inheritest: negative   First Screen: negative   TDaP vaccine   07/2020 Hgb A1C or  GTT Early : n/a Third trimester : 90  Rhogam   n/a   LAB RESULTS   Feeding Plan  breast Blood Type   A+  Contraception  POPs Antibody  neg  Circumcision  n/a Rubella  immune  Pediatrician   RPR   NR  Support Person  Adam - husband HBsAg   neg  Prenatal Classes  HIV  neg    Varicella  immune  BTL Consent  n/a GBS  positive       VBAC Consent  Desires VBAC Pap  NIL 2021   CS 2019 Hgb Electro      CF  neg     SMA  neg                 Previous Version      -Reviewed GBS positive result, discussed recommendations for antibiotics in labor -Discussed coping in labor and pain management options  Term labor precautions including but not limited to vaginal bleeding, contractions, leaking of fluid and fetal movement were reviewed in detail with the patient.    Keep previously scheduled follow-up.  Zipporah Plants, CNM, MSN [facility], [facility] 08/16/2020, 10:29 AM

## 2020-08-20 ENCOUNTER — Encounter: Payer: Self-pay | Admitting: Family

## 2020-08-20 ENCOUNTER — Other Ambulatory Visit: Payer: Self-pay

## 2020-08-20 ENCOUNTER — Ambulatory Visit: Payer: BLUE CROSS/BLUE SHIELD | Admitting: Family

## 2020-08-20 VITALS — BP 128/68 | HR 108 | Ht 66.0 in | Wt 208.0 lb

## 2020-08-20 DIAGNOSIS — R030 Elevated blood-pressure reading, without diagnosis of hypertension: Secondary | ICD-10-CM | POA: Diagnosis not present

## 2020-08-20 DIAGNOSIS — Q210 Ventricular septal defect: Secondary | ICD-10-CM | POA: Diagnosis not present

## 2020-08-20 NOTE — Progress Notes (Signed)
Office Visit    Patient Name: Tiffany Conner Date of Encounter: 08/20/2020  PCP:  Doreene Nest, NP   [facility]  Cardiologist:  Julien Nordmann, MD  Advanced Practice Provider:  No care team member to display Electrophysiologist:  None   Chief Complaint    Tiffany Conner is a 30 y.o. female with a hx of supracristal VSD, HTN presents today for follow up of VSD   Past Medical History    Past Medical History:  Diagnosis Date  . Heart murmur   . VSD (ventricular septal defect)    Past Surgical History:  Procedure Laterality Date  . CESAREAN SECTION N/A 02/27/2018   Procedure: CESAREAN SECTION;  Surgeon: Vena Austria, MD;  Location: [facility] ORS;  Service: Obstetrics;  Laterality: N/A;  . MYRINGOTOMY    . TIBIA FRACTURE SURGERY      Allergies  No Known Allergies  History of Present Illness    Tiffany Conner is a 30 y.o. female with a hx of supracristal VSD, HTN last seen 08/18/19.  Previously seen by pediatric cardiology up to 2011. Echo in 2006, 2011, and 2017. Per Dr. Windell Hummingbird last note she has a very small supracristal VSD, normal right heart size and function with no elevated right heart pressures.    Last seen 08/18/19 staying busy caring for 63 month old son and working from home in [employer]. Home BP was well controlled 120s/70s and she was exercising regularly. She had repeat echo 10/06/19 for monitoring showing EF 60-65%, no RWMA, normal diastolic parameters, small supracristal VSD, no AV stenosis, RV normal size and function.   Presents today for follow-up.  She is [redacted] weeks pregnant with her second child, a daughter they plan to name Tiffany Conner.  Her son is now 2-1/2.  She continues to work from home in [employer]. Reports no shortness of breath nor dyspnea on exertion. Reports no chest pain, pressure, or tightness. No orthopnea, PND.  She has had some bilateral ankle edema in this pregnancy similar to her last that improved  with lower extremity elevation.  She does not wear compression stockings.  Reports occasional sensation of palpitations but self resolved and most often happen when laying down.  Not associated with pain or shortness of breath.  She is not drinking coffee no alcohol.  Tells me she is try to stay well-hydrated but difficult while pregnant.  EKGs/Labs/Other Studies Reviewed:   The following studies were reviewed today:  Echo 10/06/19  1. Left ventricular ejection fraction, by estimation, is 60 to 65%. The  left ventricle has normal function. The left ventricle has no regional  wall motion abnormalities. Left ventricular diastolic parameters were  normal.   2. Small supracristal VSD noted. No significant aortic valve stenosis  noted.   3. Right ventricular systolic function is normal. The right ventricular  size is normal. Tricuspid regurgitation signal is inadequate for assessing  PA pressure.   Echo 10/2015 Left ventricle: The cavity size was normal. Systolic function was    normal. The estimated ejection fraction was in the range of 55%    to 60%. Wall motion was normal; there were no regional wall    motion abnormalities. Left ventricular diastolic function    parameters were normal.  - Left atrium: The atrium was normal in size.  - Right ventricle: Systolic function was normal.  - Pulmonary arteries: Systolic pressure was within the normal    range.   EKG:  EKG is  ordered  today.  The ekg ordered today demonstrates sinus tachycardia 108 bpm.  Some T wave versions noted in which has been present previously and likely demonstrates demand ischemia in the setting of tachycardia.  Recent Labs: 06/21/2020: Hemoglobin 12.0; Platelets 237  Recent Lipid Panel No results found for: CHOL, TRIG, HDL, CHOLHDL, VLDL, LDLCALC, LDLDIRECT  Home Medications   Current Meds  Medication Sig  . Prenatal Vit-Fe Fumarate-FA (MULTIVITAMIN-PRENATAL) 27-0.8 MG TABS tablet Take 1 tablet by mouth daily at 12  noon.     Review of Systems  All other systems reviewed and are otherwise negative except as noted above.  Physical Exam    VS:  BP 128/68 (BP Location: Left Arm, Patient Position: Sitting, Cuff Size: Normal)   Pulse (!) 108   Ht 5\' 6"  (1.676 m)   Wt 208 lb (94.3 kg)   LMP 11/30/2019   SpO2 98%   BMI 33.57 kg/m  , BMI Body mass index is 33.57 kg/m.  Wt Readings from Last 3 Encounters:  08/20/20 208 lb (94.3 kg)  08/16/20 209 lb (94.8 kg)  08/09/20 207 lb (93.9 kg)    GEN: Well nourished, well developed, in no acute distress. HEENT: normal. Neck: Supple, no JVD, carotid bruits, or masses. Cardiac: Tachycardic, no murmurs, rubs, or gallops. No clubbing, cyanosis.  Nonpitting pedal edema.  Radials/DP/PT 2+ and equal bilaterally.  Respiratory:  Respirations regular and unlabored, clear to auscultation bilaterally. GI: Soft, nontender, nondistended. MS: No deformity or atrophy. Skin: Warm and dry, no rash. Neuro:  Strength and sensation are intact. Psych: Normal affect.  Assessment & Plan    1. Supracristal VSD - Stable mild supracristal VSD by echo 09/2019.  No new shortness of breath nor cardiac symptoms.  Plan for monitoring with periodic echo.  No negation for repeat echo at this time.  2. Elevated BP without diagnosis of HTN - Previously elevated BP.  BP well controlled today.  No indication for antihypertensive.  Disposition: Follow up in 1-1.5 years with Dr. 10/2019 or APP   Signed, Mariah Milling, NP 08/20/2020, 4:14 PM [facility]

## 2020-08-20 NOTE — Patient Instructions (Signed)
Medication Instructions:  Continue your current medications.   *If you need a refill on your cardiac medications before your next appointment, please call your pharmacy*  Lab Work: None ordered today.   Testing/Procedures: Your EKG today shows normal sinus tachycardia which is a fast but regular heart rate.   Follow-Up: At [facility], you and your health needs are our priority.  As part of our continuing mission to provide you with exceptional heart care, we have created designated Provider Care Teams.  These Care Teams include your primary Cardiologist (physician) and Advanced Practice Providers (APPs -  Physician Assistants and Nurse Practitioners) who all work together to provide you with the care you need, when you need it.  We recommend signing up for the patient portal called "MyChart".  Sign up information is provided on this After Visit Summary.  MyChart is used to connect with patients for Virtual Visits (Telemedicine).  Patients are able to view lab/test results, encounter notes, upcoming appointments, etc.  Non-urgent messages can be sent to your provider as well.   To learn more about what you can do with MyChart, go to [email or web address].    Your next appointment:   1-1.5 year(s)  The format for your next appointment:   In Person  Provider:   You may see Julien Nordmann, MD or one of the following Advanced Practice Providers on your designated Care Team:    Nicolasa Ducking, NP  Eula Listen, PA-C  Marisue Ivan, PA-C  Cadence Chewton, [place]  Gillian Shields, NP

## 2020-08-23 ENCOUNTER — Ambulatory Visit (INDEPENDENT_AMBULATORY_CARE_PROVIDER_SITE_OTHER): Payer: BLUE CROSS/BLUE SHIELD | Admitting: Obstetrics and Gynecology

## 2020-08-23 ENCOUNTER — Other Ambulatory Visit: Payer: Self-pay

## 2020-08-23 VITALS — BP 128/78 | Wt 210.0 lb

## 2020-08-23 DIAGNOSIS — Z3A38 38 weeks gestation of pregnancy: Secondary | ICD-10-CM

## 2020-08-23 DIAGNOSIS — Z3480 Encounter for supervision of other normal pregnancy, unspecified trimester: Secondary | ICD-10-CM

## 2020-08-23 DIAGNOSIS — Z98891 History of uterine scar from previous surgery: Secondary | ICD-10-CM

## 2020-08-23 LAB — POCT URINALYSIS DIPSTICK OB
Glucose, UA: NEGATIVE
POC,PROTEIN,UA: NEGATIVE

## 2020-08-23 NOTE — Progress Notes (Signed)
Routine Prenatal Care Visit  Subjective  Tiffany Conner is a 30 y.o. G2P1001 at [redacted]w[redacted]d being seen today for ongoing prenatal care.  She is currently monitored for the following issues for this low-risk pregnancy and has PSORIASIS; VSD; Hemorrhoids; Supervision of other normal pregnancy, antepartum; and History of cesarean delivery on their problem list.  ----------------------------------------------------------------------------------- Patient reports no complaints.   Contractions: Irregular. Vag. Bleeding: None.  Movement: Present. Denies leaking of fluid.  ----------------------------------------------------------------------------------- The following portions of the patient's history were reviewed and updated as appropriate: allergies, current medications, past family history, past medical history, past social history, past surgical history and problem list. Problem list updated.   Objective  Blood pressure 128/78, weight 210 lb (95.3 kg), last menstrual period 11/30/2019. Pregravid weight 168 lb (76.2 kg) Total Weight Gain 42 lb (19.1 kg) Urinalysis:      Fetal Status: Fetal Heart Rate (bpm): 135 Fundal Height: 37 cm Movement: Present  Presentation: Vertex  General:  Alert, oriented and cooperative. Patient is in no acute distress.  Skin: Skin is warm and dry. No rash noted.   Cardiovascular: Normal heart rate noted  Respiratory: Normal respiratory effort, no problems with respiration noted  Abdomen: Soft, gravid, appropriate for gestational age. Pain/Pressure: Absent     Pelvic:  Cervical exam deferred        Extremities: Normal range of motion.     ental Status: Normal mood and affect. Normal behavior. Normal judgment and thought content.     Assessment   30 y.o. G2P1001 at [redacted]w[redacted]d by  09/05/2020, by Last Menstrual Period presenting for routine prenatal visit  Plan   pregnancy 2 Problems (from 01/30/20 to present)    Problem Noted Resolved   History of cesarean  delivery 08/02/2020 by Nadara Mustard, MD No   Overview Signed 08/02/2020  4:47 PM by Nadara Mustard, MD    OB/GYN  Counseling Note  30 y.o. G2P1001 at 104w1d with Estimated Date of Delivery: 09/05/20 was seen today in office to discuss trial of labor after cesarean section (TOLAC) versus elective repeat cesarean delivery (ERCD). The following risks were discussed with the patient.  Risk of uterine rupture at term is 0.78 percent with TOLAC and 0.22 percent with ERCD. 1 in 10 uterine ruptures will result in neonatal death or neurological injury. The benefits of a trial of labor after cesarean (TOLAC) resulting in a vaginal birth after cesarean (VBAC) include the following: shorter length of hospital stay and postpartum recovery (in most cases); fewer complications, such as postpartum fever, wound or uterine infection, thromboembolism (blood clots in the leg or lung), need for blood transfusion and fewer neonatal breathing problems.  The risks of an attempted VBAC or TOLAC include the following: Risk of failed trial of labor after cesarean (TOLAC) without a vaginal birth after cesarean (VBAC) resulting in repeat cesarean delivery (RCD) in about 20 to 40 percent of women who attempt VBAC.  Risk of rupture of uterus resulting in an emergency cesarean delivery. The risk of uterine rupture may be related in part to the type of uterine incision made during the first cesarean delivery. A previous transverse uterine incision has the lowest risk of rupture (0.2 to 1.5 percent risk). Vertical or T-shaped uterine incisions have a higher risk of uterine rupture (4 to 9 percent risk)The risk of fetal death is very low with both VBAC and elective repeat cesarean delivery (ERCD), but the likelihood of fetal death is higher with VBAC than with ERCD. Maternal death  is very rare with either type of delivery.  The risks of an elective repeat cesarean delivery (ERCD) were reviewed with the patient including but not limited  to: 07/998 risk of uterine rupture which could have serious consequences, bleeding which may require transfusion; infection which may require antibiotics; injury to bowel, bladder or other surrounding organs (bowel, bladder, ureters); injury to the fetus; need for additional procedures including hysterectomy in the event of a life-threatening hemorrhage; thromboembolic phenomenon; abnormal placentation; incisional problems; death and other postoperative or anesthesia complications.    Desires VBAC      Supervision of other normal pregnancy, antepartum 01/30/2020 by Mirna Mires, CNM No   Overview Addendum 08/16/2020 10:29 AM by Zipporah Plants, CNM     Nursing Staff Provider  Office Location  [facility] Dating   LMP = 10 Wk [place]  Language  English Anatomy US  Normal  Flu Vaccine  decl Genetic Screen  Inheritest: negative   First Screen: negative   TDaP vaccine   07/2020 Hgb A1C or  GTT Early : n/a Third trimester : 90  Rhogam   n/a   LAB RESULTS   Feeding Plan  breast Blood Type   A+  Contraception  POPs Antibody  neg  Circumcision  n/a Rubella  immune  Pediatrician   RPR   NR  Support Person  Adam - husband HBsAg   neg  Prenatal Classes  HIV  neg    Varicella  immune  BTL Consent  n/a GBS  positive       VBAC Consent  Desires VBAC Pap  NIL 2021   CS 2019 Hgb Electro      CF  neg     SMA  neg                 Previous Version       Gestational age appropriate obstetric precautions including but not limited to vaginal bleeding, contractions, leaking of fluid and fetal movement were reviewed in detail with the patient.    Return in about 1 week (around 08/30/2020) for ROB.  Vena Austria, MD, [facility], [facility] 08/23/2020, 4:55 PM

## 2020-08-26 ENCOUNTER — Telehealth: Payer: Self-pay

## 2020-08-26 NOTE — Telephone Encounter (Signed)
-----   Message from Vena Austria, MD sent at 08/23/2020  5:43 PM EDT ----- Regarding: C-section Surgery Booking Request Patient Full Name:  Tiffany Conner  MRN: [id number]  DOB: 1991-05-11  Surgeon: Vena Austria, MD  Requested Surgery Date and Time: 09/12/2020 07:30 Primary Diagnosis AND Code: History of cesarean section Secondary Diagnosis and Code:  Surgical Procedure: Cesarean Section RNFA Requested?: No L&D Notification: Yes Admission Status: surgery admit Length of Surgery: 50 min Special Case Needs: No H&P: Yes Phone Interview???:  No Interpreter: No Medical Clearance:  No Special Scheduling Instructions: No Any known health/anesthesia issues, diabetes, sleep apnea, latex allergy, defibrillator/pacemaker?: No Acuity: P2   (P1 highest, P2 delay may cause harm, P3 low, elective gyn, P4 lowest)

## 2020-08-26 NOTE — Telephone Encounter (Signed)
Called patient to schedule c-section w [facility]. LVM  DOS 4/14 @ 7:30a   H&P 4/8 @ 2:50pm   Covid testing 4/13 @ 9:00, [facility].  Pre-admit phone call appointment to be requested - date and time will be included on H&P paper work. Also all appointments will be updated on pt MyChart. Based on the time scheduled will indicate if the call will be received within a 4 hour window before 1:00 or after.  Advised that pt may also receive calls from the hospital pharmacy and pre-service center.  Confirmed pt has [employer] as Editor, commissioning. No secondary insurance.

## 2020-08-30 ENCOUNTER — Ambulatory Visit (INDEPENDENT_AMBULATORY_CARE_PROVIDER_SITE_OTHER): Payer: BLUE CROSS/BLUE SHIELD | Admitting: Obstetrics and Gynecology

## 2020-08-30 ENCOUNTER — Other Ambulatory Visit: Payer: Self-pay

## 2020-08-30 VITALS — BP 120/80 | Wt 209.0 lb

## 2020-08-30 DIAGNOSIS — Z3480 Encounter for supervision of other normal pregnancy, unspecified trimester: Secondary | ICD-10-CM

## 2020-08-30 DIAGNOSIS — Z3A39 39 weeks gestation of pregnancy: Secondary | ICD-10-CM

## 2020-08-30 LAB — POCT URINALYSIS DIPSTICK OB
Glucose, UA: NEGATIVE
POC,PROTEIN,UA: NEGATIVE

## 2020-08-31 NOTE — Progress Notes (Signed)
Routine Prenatal Care Visit  Subjective  Tiffany Conner is a 30 y.o. G2P1001 at [redacted]w[redacted]d being seen today for ongoing prenatal care.  She is currently monitored for the following issues for this low-risk pregnancy and has PSORIASIS; VSD; Hemorrhoids; Supervision of other normal pregnancy, antepartum; and History of cesarean delivery on their problem list.  ----------------------------------------------------------------------------------- Patient reports no complaints.   Contractions: Irregular. Vag. Bleeding: None.  Movement: Present. Denies leaking of fluid.  ----------------------------------------------------------------------------------- The following portions of the patient's history were reviewed and updated as appropriate: allergies, current medications, past family history, past medical history, past social history, past surgical history and problem list. Problem list updated.   Objective  Blood pressure 120/80, weight 209 lb (94.8 kg), last menstrual period 11/30/2019. Pregravid weight 168 lb (76.2 kg) Total Weight Gain 41 lb (18.6 kg) Urinalysis:      Fetal Status: Fetal Heart Rate (bpm): 125 Fundal Height: 38 cm Movement: Present  Presentation: Vertex BSUS: Confirmed vtx presentation  General:  Alert, oriented and cooperative. Patient is in no acute distress.  Skin: Skin is warm and dry. No rash noted.   Cardiovascular: Normal heart rate noted  Respiratory: Normal respiratory effort, no problems with respiration noted  Abdomen: Soft, gravid, appropriate for gestational age. Pain/Pressure: Absent     Pelvic:  Cervical exam deferred        Extremities: Normal range of motion.     ental Status: Normal mood and affect. Normal behavior. Normal judgment and thought content.     Assessment   30 y.o. G2P1001 at [redacted]w[redacted]d by  09/05/2020, by Last Menstrual Period presenting for routine prenatal visit  Plan   pregnancy 2 Problems (from 01/30/20 to present)    Problem Noted  Resolved   History of cesarean delivery 08/02/2020 by Nadara Mustard, MD No   Overview Signed 08/02/2020  4:47 PM by Nadara Mustard, MD    OB/GYN  Counseling Note  31 y.o. G2P1001 at [redacted]w[redacted]d with Estimated Date of Delivery: 09/05/20 was seen today in office to discuss trial of labor after cesarean section (TOLAC) versus elective repeat cesarean delivery (ERCD). The following risks were discussed with the patient.  Risk of uterine rupture at term is 0.78 percent with TOLAC and 0.22 percent with ERCD. 1 in 10 uterine ruptures will result in neonatal death or neurological injury. The benefits of a trial of labor after cesarean (TOLAC) resulting in a vaginal birth after cesarean (VBAC) include the following: shorter length of hospital stay and postpartum recovery (in most cases); fewer complications, such as postpartum fever, wound or uterine infection, thromboembolism (blood clots in the leg or lung), need for blood transfusion and fewer neonatal breathing problems.  The risks of an attempted VBAC or TOLAC include the following: Risk of failed trial of labor after cesarean (TOLAC) without a vaginal birth after cesarean (VBAC) resulting in repeat cesarean delivery (RCD) in about 20 to 40 percent of women who attempt VBAC.  Risk of rupture of uterus resulting in an emergency cesarean delivery. The risk of uterine rupture may be related in part to the type of uterine incision made during the first cesarean delivery. A previous transverse uterine incision has the lowest risk of rupture (0.2 to 1.5 percent risk). Vertical or T-shaped uterine incisions have a higher risk of uterine rupture (4 to 9 percent risk)The risk of fetal death is very low with both VBAC and elective repeat cesarean delivery (ERCD), but the likelihood of fetal death is higher with VBAC than  with ERCD. Maternal death is very rare with either type of delivery.  The risks of an elective repeat cesarean delivery (ERCD) were reviewed with the  patient including but not limited to: 07/998 risk of uterine rupture which could have serious consequences, bleeding which may require transfusion; infection which may require antibiotics; injury to bowel, bladder or other surrounding organs (bowel, bladder, ureters); injury to the fetus; need for additional procedures including hysterectomy in the event of a life-threatening hemorrhage; thromboembolic phenomenon; abnormal placentation; incisional problems; death and other postoperative or anesthesia complications.    Desires VBAC      Supervision of other normal pregnancy, antepartum 01/30/2020 by Mirna Mires, CNM No   Overview Addendum 08/16/2020 10:29 AM by Zipporah Plants, CNM     Nursing Staff Provider  Office Location  [facility] Dating   LMP = 10 Wk [place]  Language  English Anatomy US  Normal  Flu Vaccine  decl Genetic Screen  Inheritest: negative   First Screen: negative   TDaP vaccine   07/2020 Hgb A1C or  GTT Early : n/a Third trimester : 90  Rhogam   n/a   LAB RESULTS   Feeding Plan  breast Blood Type   A+  Contraception  POPs Antibody  neg  Circumcision  n/a Rubella  immune  Pediatrician   RPR   NR  Support Person  Adam - husband HBsAg   neg  Prenatal Classes  HIV  neg    Varicella  immune  BTL Consent  n/a GBS  positive       VBAC Consent  Desires VBAC Pap  NIL 2021   CS 2019 Hgb Electro      CF  neg     SMA  neg                 Previous Version      Term labor precautions including but not limited to vaginal bleeding, contractions, leaking of fluid and fetal movement were reviewed in detail with the patient.    Keep previously scheduled follow-up.  Zipporah Plants, CNM, MSN [facility], [facility] 08/31/2020, 8:33 AM

## 2020-09-06 ENCOUNTER — Ambulatory Visit (INDEPENDENT_AMBULATORY_CARE_PROVIDER_SITE_OTHER): Payer: BLUE CROSS/BLUE SHIELD | Admitting: Obstetrics and Gynecology

## 2020-09-06 ENCOUNTER — Other Ambulatory Visit: Payer: Self-pay

## 2020-09-06 ENCOUNTER — Encounter: Payer: Self-pay | Admitting: Obstetrics and Gynecology

## 2020-09-06 VITALS — BP 128/74 | Ht 66.0 in | Wt 210.0 lb

## 2020-09-06 DIAGNOSIS — Z3A40 40 weeks gestation of pregnancy: Secondary | ICD-10-CM

## 2020-09-06 DIAGNOSIS — Z3480 Encounter for supervision of other normal pregnancy, unspecified trimester: Secondary | ICD-10-CM

## 2020-09-06 DIAGNOSIS — Z98891 History of uterine scar from previous surgery: Secondary | ICD-10-CM

## 2020-09-06 NOTE — Progress Notes (Signed)
Obstetric H&P   Chief Complaint: ROB C-section scheduling   Prenatal Care Provider: [facility]  History of Present Illness: 30 y.o. G2P1001 [redacted]w[redacted]d by 09/05/2020, by Last Menstrual Period presenting for ROB visit today.  Plan on repeat cesarean section if no spontaneous labor by 09/12/2020.  She is not interested in IOL if no spontaneous labor as her last Cesarean was proceeded by failed IOL.  Her pregnancy has otherwise been uncomplicated.  She reports +FM, no LOF, no VB.    Pregravid weight 168 lb (76.2 kg) Total Weight Gain 41 lb (18.6 kg)  pregnancy 2 Problems (from 01/30/20 to present)    Problem Noted Resolved   History of cesarean delivery 08/02/2020 by Nadara Mustard, MD No   Overview Signed 08/02/2020  4:47 PM by Nadara Mustard, MD    OB/GYN  Counseling Note  30 y.o. G2P1001 at [redacted]w[redacted]d with Estimated Date of Delivery: 09/05/20 was seen today in office to discuss trial of labor after cesarean section (TOLAC) versus elective repeat cesarean delivery (ERCD). The following risks were discussed with the patient.  Risk of uterine rupture at term is 0.78 percent with TOLAC and 0.22 percent with ERCD. 1 in 10 uterine ruptures will result in neonatal death or neurological injury. The benefits of a trial of labor after cesarean (TOLAC) resulting in a vaginal birth after cesarean (VBAC) include the following: shorter length of hospital stay and postpartum recovery (in most cases); fewer complications, such as postpartum fever, wound or uterine infection, thromboembolism (blood clots in the leg or lung), need for blood transfusion and fewer neonatal breathing problems.  The risks of an attempted VBAC or TOLAC include the following: Risk of failed trial of labor after cesarean (TOLAC) without a vaginal birth after cesarean (VBAC) resulting in repeat cesarean delivery (RCD) in about 20 to 40 percent of women who attempt VBAC.  Risk of rupture of uterus resulting in an emergency cesarean delivery. The risk of  uterine rupture may be related in part to the type of uterine incision made during the first cesarean delivery. A previous transverse uterine incision has the lowest risk of rupture (0.2 to 1.5 percent risk). Vertical or T-shaped uterine incisions have a higher risk of uterine rupture (4 to 9 percent risk)The risk of fetal death is very low with both VBAC and elective repeat cesarean delivery (ERCD), but the likelihood of fetal death is higher with VBAC than with ERCD. Maternal death is very rare with either type of delivery.  The risks of an elective repeat cesarean delivery (ERCD) were reviewed with the patient including but not limited to: 07/998 risk of uterine rupture which could have serious consequences, bleeding which may require transfusion; infection which may require antibiotics; injury to bowel, bladder or other surrounding organs (bowel, bladder, ureters); injury to the fetus; need for additional procedures including hysterectomy in the event of a life-threatening hemorrhage; thromboembolic phenomenon; abnormal placentation; incisional problems; death and other postoperative or anesthesia complications.    Desires VBAC      Supervision of other normal pregnancy, antepartum 01/30/2020 by Mirna Mires, CNM No   Overview Addendum 08/16/2020 10:29 AM by Zipporah Plants, CNM     Nursing Staff Provider  Office Location  [facility] Dating   LMP = 10 Wk [place]  Language  English Anatomy US  Normal  Flu Vaccine  decl Genetic Screen  Inheritest: negative   First Screen: negative   TDaP vaccine   07/2020 Hgb A1C or  GTT Early : n/a  Third trimester : 8  Rhogam   n/a   LAB RESULTS   Feeding Plan  breast Blood Type   A+  Contraception  POPs Antibody  neg  Circumcision  n/a Rubella  immune  Pediatrician   RPR   NR  Support Person  Adam - husband HBsAg   neg  Prenatal Classes  HIV  neg    Varicella  immune  BTL Consent  n/a GBS  positive       VBAC Consent  Desires VBAC Pap  NIL 2021   CS 2019  Hgb Electro      CF  neg     SMA  neg                 Previous Version       Review of Systems: 10 point review of systems negative unless otherwise noted in HPI  Past Medical History: Patient Active Problem List   Diagnosis Date Noted  . History of cesarean delivery 08/02/2020    OB/GYN  Counseling Note  30 y.o. G2P1001 at [redacted]w[redacted]d with Estimated Date of Delivery: 09/05/20 was seen today in office to discuss trial of labor after cesarean section (TOLAC) versus elective repeat cesarean delivery (ERCD). The following risks were discussed with the patient.  Risk of uterine rupture at term is 0.78 percent with TOLAC and 0.22 percent with ERCD. 1 in 10 uterine ruptures will result in neonatal death or neurological injury. The benefits of a trial of labor after cesarean (TOLAC) resulting in a vaginal birth after cesarean (VBAC) include the following: shorter length of hospital stay and postpartum recovery (in most cases); fewer complications, such as postpartum fever, wound or uterine infection, thromboembolism (blood clots in the leg or lung), need for blood transfusion and fewer neonatal breathing problems.  The risks of an attempted VBAC or TOLAC include the following: Risk of failed trial of labor after cesarean (TOLAC) without a vaginal birth after cesarean (VBAC) resulting in repeat cesarean delivery (RCD) in about 20 to 40 percent of women who attempt VBAC.  Risk of rupture of uterus resulting in an emergency cesarean delivery. The risk of uterine rupture may be related in part to the type of uterine incision made during the first cesarean delivery. A previous transverse uterine incision has the lowest risk of rupture (0.2 to 1.5 percent risk). Vertical or T-shaped uterine incisions have a higher risk of uterine rupture (4 to 9 percent risk)The risk of fetal death is very low with both VBAC and elective repeat cesarean delivery (ERCD), but the likelihood of fetal death is higher with VBAC  than with ERCD. Maternal death is very rare with either type of delivery.  The risks of an elective repeat cesarean delivery (ERCD) were reviewed with the patient including but not limited to: 07/998 risk of uterine rupture which could have serious consequences, bleeding which may require transfusion; infection which may require antibiotics; injury to bowel, bladder or other surrounding organs (bowel, bladder, ureters); injury to the fetus; need for additional procedures including hysterectomy in the event of a life-threatening hemorrhage; thromboembolic phenomenon; abnormal placentation; incisional problems; death and other postoperative or anesthesia complications.    Desires VBAC   . Supervision of other normal pregnancy, antepartum 01/30/2020     Nursing Staff Provider  Office Location  [facility] Dating   LMP = 10 Wk [place]  Language  English Anatomy US  Normal  Flu Vaccine  decl Genetic Screen  Inheritest: negative   First Screen: negative  TDaP vaccine   07/2020 Hgb A1C or  GTT Early : n/a Third trimester : 90  Rhogam   n/a   LAB RESULTS   Feeding Plan  breast Blood Type   A+  Contraception  POPs Antibody  neg  Circumcision  n/a Rubella  immune  Pediatrician   RPR   NR  Support Person  Adam - husband HBsAg   neg  Prenatal Classes  HIV  neg    Varicella  immune  BTL Consent  n/a GBS  positive       VBAC Consent  Desires VBAC Pap  NIL 2021   CS 2019 Hgb Electro      CF  neg     SMA  neg              . Hemorrhoids 09/25/2019  . PSORIASIS 03/23/2008    Qualifier: Diagnosis of  By: Lindwood Qua CMA, Jerl Santos     . VSD 03/23/2008    Qualifier: Diagnosis of  By: Lindwood Qua CMA, Jerl Santos       Past Surgical History: Past Surgical History:  Procedure Laterality Date  . CESAREAN SECTION N/A 02/27/2018   Procedure: CESAREAN SECTION;  Surgeon: Vena Austria, MD;  Location: [facility] ORS;  Service: Obstetrics;  Laterality: N/A;  . MYRINGOTOMY    . TIBIA FRACTURE SURGERY       Past Obstetric History: # 1 - Date: 02/27/18, Sex: Female, Weight: 5 lb 0.1 oz (2.27 kg), GA: [redacted]w[redacted]d, Delivery: C-Section, Low Transverse, Apgar1: 8, Apgar5: 9, Living: Living, Birth Comments: sga  # 2 - Date: None, Sex: None, Weight: None, GA: None, Delivery: None, Apgar1: None, Apgar5: None, Living: None, Birth Comments: None   Past Gynecologic History:  Family History: Family History  Problem Relation Age of Onset  . Ovarian cancer Other     Social History: Social History   Socioeconomic History  . Marital status: Married    Spouse name: Madelaine Bhat  . Number of children: Not on file  . Years of education: 99  . Highest education level: Not on file  Occupational History  . Occupation: ACC COORDINATOR  Tobacco Use  . Smoking status: Never Smoker  . Smokeless tobacco: Never Used  Vaping Use  . Vaping Use: Never used  Substance and Sexual Activity  . Alcohol use: No  . Drug use: No  . Sexual activity: Yes    Birth control/protection: Pill  Other Topics Concern  . Not on file  Social History Narrative   Married.   Works as a Nurse, adult for [employer].    Graduate of [employer].   Enjoys reading exercise.    Social Determinants of Health   Financial Resource Strain: Not on file  Food Insecurity: Not on file  Transportation Needs: Not on file  Physical Activity: Not on file  Stress: Not on file  Social Connections: Not on file  Intimate Partner Violence: Not on file    Medications: Prior to Admission medications   Medication Sig Start Date End Date Taking? Authorizing Provider  Prenatal Vit-Fe Fumarate-FA (MULTIVITAMIN-PRENATAL) 27-0.8 MG TABS tablet Take 1 tablet by mouth daily.    [provider]    Allergies: No Known Allergies  Physical Exam: Vitals: Blood pressure 128/74, height 5\' 6"  (1.676 m), weight 210 lb (95.3 kg), last menstrual period 11/30/2019.  FHT: 145  General: NAD HEENT: normocephalic, anicteric Pulmonary: No increased work  of breathing, CTAB Cardiovascular: RRR, distal pulses 2+ Abdomen: Gravid, non-tender Extremities: no edema, erythema, or  tenderness Neurologic: Grossly intact Psychiatric: mood appropriate, affect full  Labs: No results found for this or any previous visit (from the past 24 hour(s)).  Assessment: 30 y.o. G2P1001 4069w1d by 09/05/2020, by Last Menstrual Period presenting for ROB and C-section scheduling  Plan: 1) The patient was counseled regarding risk and benefits to proceeding with Cesarean section to expedite delivery.  Risk of cesarean section were discussed including risk of bleeding and need for potential intraoperative or postoperative blood transfusion with a rate of approximately 5% quoted for all Cesarean sections, risk of injury to adjacent organs including but not limited to bowl and bladder, the need for additional surgical procedures to address such injuries, and the risk of infection.    2) Fetus - +FHT  3) PNL - Blood type A/Positive/-- (09/15 1222) / Anti-bodyscreen Negative (09/15 1222) / Rubella 1.48 (09/15 1222) / Varicella Immune / RPR Non Reactive (01/21 1528) / HBsAg Negative (09/15 1222) / HIV Non Reactive (01/21 1528) / GBS Positive/-- (03/11 1612)  4) Immunization History -  Immunization History  Administered Date(s) Administered  . Td 02/03/2007  . Tdap 01/19/2018, 07/19/2020    5) Disposition - plan on repeat C-section 4/14  Vena AustriaAndreas Jerita Wimbush, MD, Merlinda FrederickFACOG [facility], [facility] 09/06/2020, 3:46 PM

## 2020-09-06 NOTE — H&P (View-Only) (Signed)
Obstetric H&P   Chief Complaint: Tiffany Conner   Prenatal Care Provider: [facility]  History of Present Illness: 30 y.o. G2P1001 [redacted]w[redacted]d by 09/05/2020, by Last Menstrual Period presenting for Tiffany visit today.  Plan on repeat cesarean section if no spontaneous labor by 09/12/2020.  She is not interested in IOL if no spontaneous labor as her last Cesarean was proceeded by failed IOL.  Her pregnancy has otherwise been uncomplicated.  She reports +FM, no LOF, no VB.    Pregravid weight 168 lb (76.2 kg) Total Weight Gain 41 lb (18.6 kg)  pregnancy 2 Problems (from 01/30/20 to present)    Problem Noted Resolved   History of cesarean delivery 08/02/2020 by Nadara Mustard, MD No   Overview Signed 08/02/2020  4:47 PM by Nadara Mustard, MD    OB/GYN  Counseling Note  30 y.o. G2P1001 at [redacted]w[redacted]d with Estimated Date of Delivery: 09/05/20 was seen today in office to discuss trial of labor after cesarean section (TOLAC) versus elective repeat cesarean delivery (ERCD). The following risks were discussed with the patient.  Risk of uterine rupture at term is 0.78 percent with TOLAC and 0.22 percent with ERCD. 1 in 10 uterine ruptures will result in neonatal death or neurological injury. The benefits of a trial of labor after cesarean (TOLAC) resulting in a vaginal birth after cesarean (VBAC) include the following: shorter length of hospital stay and postpartum recovery (in most cases); fewer complications, such as postpartum fever, wound or uterine infection, thromboembolism (blood clots in the leg or lung), need for blood transfusion and fewer neonatal breathing problems.  The risks of an attempted VBAC or TOLAC include the following: Risk of failed trial of labor after cesarean (TOLAC) without a vaginal birth after cesarean (VBAC) resulting in repeat cesarean delivery (RCD) in about 20 to 40 percent of women who attempt VBAC.  Risk of rupture of uterus resulting in an emergency cesarean delivery. The risk of  uterine rupture may be related in part to the type of uterine incision made during the first cesarean delivery. A previous transverse uterine incision has the lowest risk of rupture (0.2 to 1.5 percent risk). Vertical or T-shaped uterine incisions have a higher risk of uterine rupture (4 to 9 percent risk)The risk of fetal death is very low with both VBAC and elective repeat cesarean delivery (ERCD), but the likelihood of fetal death is higher with VBAC than with ERCD. Maternal death is very rare with either type of delivery.  The risks of an elective repeat cesarean delivery (ERCD) were reviewed with the patient including but not limited to: 07/998 risk of uterine rupture which could have serious consequences, bleeding which may require transfusion; infection which may require antibiotics; injury to bowel, bladder or other surrounding organs (bowel, bladder, ureters); injury to the fetus; need for additional procedures including hysterectomy in the event of a life-threatening hemorrhage; thromboembolic phenomenon; abnormal placentation; incisional problems; death and other postoperative or anesthesia complications.    Desires VBAC      Supervision of other normal pregnancy, antepartum 01/30/2020 by Mirna Mires, CNM No   Overview Addendum 08/16/2020 10:29 AM by Zipporah Plants, CNM     Nursing Staff Provider  Office Location  [facility] Dating   LMP = 10 Wk [place]  Language  English Anatomy US  Normal  Flu Vaccine  decl Genetic Screen  Inheritest: negative   First Screen: negative   TDaP vaccine   07/2020 Hgb A1C or  GTT Early : n/a  Third trimester : 8  Rhogam   n/a   LAB RESULTS   Feeding Plan  breast Blood Type   A+  Contraception  POPs Antibody  neg  Circumcision  n/a Rubella  immune  Pediatrician   RPR   NR  Support Person  Adam - husband HBsAg   neg  Prenatal Classes  HIV  neg    Varicella  immune  BTL Consent  n/a GBS  positive       VBAC Consent  Desires VBAC Pap  NIL 2021   CS 2019  Hgb Electro      CF  neg     SMA  neg                 Previous Version       Review of Systems: 10 point review of systems negative unless otherwise noted in HPI  Past Medical History: Patient Active Problem List   Diagnosis Date Noted  . History of cesarean delivery 08/02/2020    OB/GYN  Counseling Note  30 y.o. G2P1001 at [redacted]w[redacted]d with Estimated Date of Delivery: 09/05/20 was seen today in office to discuss trial of labor after cesarean section (TOLAC) versus elective repeat cesarean delivery (ERCD). The following risks were discussed with the patient.  Risk of uterine rupture at term is 0.78 percent with TOLAC and 0.22 percent with ERCD. 1 in 10 uterine ruptures will result in neonatal death or neurological injury. The benefits of a trial of labor after cesarean (TOLAC) resulting in a vaginal birth after cesarean (VBAC) include the following: shorter length of hospital stay and postpartum recovery (in most cases); fewer complications, such as postpartum fever, wound or uterine infection, thromboembolism (blood clots in the leg or lung), need for blood transfusion and fewer neonatal breathing problems.  The risks of an attempted VBAC or TOLAC include the following: Risk of failed trial of labor after cesarean (TOLAC) without a vaginal birth after cesarean (VBAC) resulting in repeat cesarean delivery (RCD) in about 20 to 40 percent of women who attempt VBAC.  Risk of rupture of uterus resulting in an emergency cesarean delivery. The risk of uterine rupture may be related in part to the type of uterine incision made during the first cesarean delivery. A previous transverse uterine incision has the lowest risk of rupture (0.2 to 1.5 percent risk). Vertical or T-shaped uterine incisions have a higher risk of uterine rupture (4 to 9 percent risk)The risk of fetal death is very low with both VBAC and elective repeat cesarean delivery (ERCD), but the likelihood of fetal death is higher with VBAC  than with ERCD. Maternal death is very rare with either type of delivery.  The risks of an elective repeat cesarean delivery (ERCD) were reviewed with the patient including but not limited to: 07/998 risk of uterine rupture which could have serious consequences, bleeding which may require transfusion; infection which may require antibiotics; injury to bowel, bladder or other surrounding organs (bowel, bladder, ureters); injury to the fetus; need for additional procedures including hysterectomy in the event of a life-threatening hemorrhage; thromboembolic phenomenon; abnormal placentation; incisional problems; death and other postoperative or anesthesia complications.    Desires VBAC   . Supervision of other normal pregnancy, antepartum 01/30/2020     Nursing Staff Provider  Office Location  [facility]   LMP = 10 Wk [place]  Language  English Anatomy US  Normal  Flu Vaccine  decl Genetic Screen  Inheritest: negative   First Screen: negative  TDaP vaccine   07/2020 Hgb A1C or  GTT Early : n/a Third trimester : 90  Rhogam   n/a   LAB RESULTS   Feeding Plan  breast Blood Type   A+  Contraception  POPs Antibody  neg  Circumcision  n/a Rubella  immune  Pediatrician   RPR   NR  Support Person  Adam - husband HBsAg   neg  Prenatal Classes  HIV  neg    Varicella  immune  BTL Consent  n/a GBS  positive       VBAC Consent  Desires VBAC Pap  NIL 2021   CS 2019 Hgb Electro      CF  neg     SMA  neg              . Hemorrhoids 09/25/2019  . PSORIASIS 03/23/2008    Qualifier: Diagnosis of  By: Lindwood Qua CMA, Jerl Santos     . VSD 03/23/2008    Qualifier: Diagnosis of  By: Lindwood Qua CMA, Jerl Santos       Past Surgical History: Past Surgical History:  Procedure Laterality Date  . CESAREAN SECTION N/A 02/27/2018   Procedure: CESAREAN SECTION;  Surgeon: Vena Austria, MD;  Location: [facility] ORS;  Service: Obstetrics;  Laterality: N/A;  . MYRINGOTOMY    . TIBIA FRACTURE SURGERY       Past Obstetric History: # 1 - Date: 02/27/18, Sex: Female, Weight: 5 lb 0.1 oz (2.27 kg), GA: [redacted]w[redacted]d, Delivery: C-Section, Low Transverse, Apgar1: 8, Apgar5: 9, Living: Living, Birth Comments: sga  # 2 - Date: None, Sex: None, Weight: None, GA: None, Delivery: None, Apgar1: None, Apgar5: None, Living: None, Birth Comments: None   Past Gynecologic History:  Family History: Family History  Problem Relation Age of Onset  . Ovarian cancer Other     Social History: Social History   Socioeconomic History  . Marital status: Married    Spouse name: Madelaine Bhat  . Number of children: Not on file  . Years of education: 99  . Highest education level: Not on file  Occupational History  . Occupation: ACC COORDINATOR  Tobacco Use  . Smoking status: Never Smoker  . Smokeless tobacco: Never Used  Vaping Use  . Vaping Use: Never used  Substance and Sexual Activity  . Alcohol use: No  . Drug use: No  . Sexual activity: Yes    Birth control/protection: Pill  Other Topics Concern  . Not on file  Social History Narrative   Married.   Works as a Nurse, adult for [employer].    Graduate of [employer].   Enjoys reading exercise.    Social Determinants of Health   Financial Resource Strain: Not on file  Food Insecurity: Not on file  Transportation Needs: Not on file  Physical Activity: Not on file  Stress: Not on file  Social Connections: Not on file  Intimate Partner Violence: Not on file    Medications: Prior to Admission medications   Medication Sig Start Date End Date Taking? Authorizing Provider  Prenatal Vit-Fe Fumarate-FA (MULTIVITAMIN-PRENATAL) 27-0.8 MG TABS tablet Take 1 tablet by mouth daily.    [provider]    Allergies: No Known Allergies  Physical Exam: Vitals: Blood pressure 128/74, height 5\' 6"  (1.676 m), weight 210 lb (95.3 kg), last menstrual period 11/30/2019.  FHT: 145  General: NAD HEENT: normocephalic, anicteric Pulmonary: No increased work  of breathing, CTAB Cardiovascular: RRR, distal pulses 2+ Abdomen: Gravid, non-tender Extremities: no edema, erythema, or  tenderness Neurologic: Grossly intact Psychiatric: mood appropriate, affect full  Labs: No results found for this or any previous visit (from the past 24 hour(s)).  Assessment: 30 y.o. G2P1001 [redacted]w[redacted]d by 09/05/2020, by Last Menstrual Period presenting for Tiffany and C-section Conner  Plan: 1) The patient was counseled regarding risk and benefits to proceeding with Cesarean section to expedite delivery.  Risk of cesarean section were discussed including risk of bleeding and need for potential intraoperative or postoperative blood transfusion with a rate of approximately 5% quoted for all Cesarean sections, risk of injury to adjacent organs including but not limited to bowl and bladder, the need for additional surgical procedures to address such injuries, and the risk of infection.    2) Fetus - +FHT  3) PNL - Blood type A/Positive/-- (09/15 1222) / Anti-bodyscreen Negative (09/15 1222) / Rubella 1.48 (09/15 1222) / Varicella Immune / RPR Non Reactive (01/21 1528) / HBsAg Negative (09/15 1222) / HIV Non Reactive (01/21 1528) / GBS Positive/-- (03/11 1612)  4) Immunization History -  Immunization History  Administered Date(s) Administered  . Td 02/03/2007  . Tdap 01/19/2018, 07/19/2020    5) Disposition - plan on repeat C-section 4/14  Isamar Wellbrock, MD, FACOG [facility], [facility] 09/06/2020, 3:46 PM    

## 2020-09-09 ENCOUNTER — Other Ambulatory Visit: Payer: Self-pay

## 2020-09-09 ENCOUNTER — Encounter
Admission: RE | Admit: 2020-09-09 | Discharge: 2020-09-09 | Disposition: A | Discharge status: Home / Self Care | Payer: BLUE CROSS/BLUE SHIELD | Source: Ambulatory Visit | Attending: Obstetrics and Gynecology | Admitting: Obstetrics and Gynecology

## 2020-09-09 HISTORY — DX: Elevated blood-pressure reading, without diagnosis of hypertension: R03.0

## 2020-09-09 NOTE — Patient Instructions (Signed)
Your procedure is scheduled on:09-12-20 THURSDAY Report to the Medical Mall @ 5:30 am-Then you will proceed to the 3rd floor Labor and Delivery  REMEMBER: Instructions that are not followed completely may result in serious medical risk, up to and including death; or upon the discretion of your surgeon and anesthesiologist your surgery may need to be rescheduled.  Do not eat food after midnight the night before surgery.  No gum chewing, lozengers or hard candies.  You may however, drink CLEAR liquids up to 2 hours before you are scheduled to arrive for your surgery. Do not drink anything within 2 hours of your scheduled arrival time.  Clear liquids include: - water  - apple juice without pulp - gatorade (not RED) - black coffee or tea (Do NOT add milk or creamers to the coffee or tea) Do NOT drink anything that is not on this list.  DO NOT TAKE ANY MEDICATION THE DAY OF SURGERY  One week prior to surgery: Stop Anti-inflammatories (NSAIDS) such as Advil, Aleve, Ibuprofen, Motrin, Naproxen, Naprosyn and Aspirin based products such as Excedrin, Goodys Powder, BC Powder-OK TO TAKE TYLENOL IF NEEDED  Stop ANY OVER THE COUNTER supplements until after surgery-HOWEVER, YOU MAY CONTINUE YOUR PRENATAL VITAMIN UP UNTIL THE DAY PRIOR TO SURGERY  No Alcohol for 24 hours before or after surgery.  No Smoking including e-cigarettes for 24 hours prior to surgery.  No chewable tobacco products for at least 6 hours prior to surgery.  No nicotine patches on the day of surgery.  Do not use any "recreational" drugs for at least a week prior to your surgery.  Please be advised that the combination of cocaine and anesthesia may have negative outcomes, up to and including death. If you test positive for cocaine, your surgery will be cancelled.  On the morning of surgery brush your teeth with toothpaste and water, you may rinse your mouth with mouthwash if you wish. Do not swallow any toothpaste or  mouthwash.  Do not wear jewelry, make-up, hairpins, clips or nail polish.  Do not wear lotions, powders, or perfumes.   Do not shave body from the neck down 48 hours prior to surgery just in case you cut yourself which could leave a site for infection.  Also, freshly shaved skin may become irritated if using the CHG soap.  Contact lenses, hearing aids and dentures may not be worn into surgery.  Do not bring valuables to the hospital. [facility] is not responsible for any missing/lost belongings or valuables.   Use CHG Soap as directed on instruction sheet-AVOID YOUR NIPPLE AND PRIVATE AREA  Notify your doctor if there is any change in your medical condition (cold, fever, infection).  Wear comfortable clothing (specific to your surgery type) to the hospital.  Plan for stool softeners for home use; pain medications have a tendency to cause constipation. You can also help prevent constipation by eating foods high in fiber such as fruits and vegetables and drinking plenty of fluids as your diet allows.  After surgery, you can help prevent lung complications by doing breathing exercises.  Take deep breaths and cough every 1-2 hours. Your doctor may order a device called an Incentive Spirometer to help you take deep breaths. When coughing or sneezing, hold a pillow firmly against your incision with both hands. This is called "splinting." Doing this helps protect your incision. It also decreases belly discomfort.  If you are being admitted to the hospital overnight, leave your suitcase in the car. After  surgery it may be brought to your room.  If you are being discharged the day of surgery, you will not be allowed to drive home. You will need a responsible adult (18 years or older) to drive you home and stay with you that night.   If you are taking public transportation, you will need to have a responsible adult (18 years or older) with you. Please confirm with your physician that it is  acceptable to use public transportation.   Please call the Pre-admissions Testing Dept. at [phone] if you have any questions about these instructions.  Surgery Visitation Policy:  Patients undergoing a surgery or procedure may have one family member or support person with them as long as that person is not COVID-19 positive or experiencing its symptoms.  That person may remain in the waiting area during the procedure.  Inpatient Visitation:    Visiting hours are 7 a.m. to 8 p.m. Inpatients will be allowed two visitors daily. The visitors may change each day during the patient's stay. No visitors under the age of 71. Any visitor under the age of 3 must be accompanied by an adult. The visitor must pass COVID-19 screenings, use hand sanitizer when entering and exiting the patient's room and wear a mask at all times, including in the patient's room. Patients must also wear a mask when staff or their visitor are in the room. Masking is required regardless of vaccination status.

## 2020-09-11 ENCOUNTER — Encounter
Admission: RE | Admit: 2020-09-11 | Discharge: 2020-09-11 | Disposition: A | Discharge status: Home / Self Care | Payer: BLUE CROSS/BLUE SHIELD | Source: Ambulatory Visit | Attending: Obstetrics and Gynecology | Admitting: Obstetrics and Gynecology

## 2020-09-11 ENCOUNTER — Other Ambulatory Visit: Payer: Self-pay

## 2020-09-11 ENCOUNTER — Other Ambulatory Visit: Payer: BLUE CROSS/BLUE SHIELD

## 2020-09-11 DIAGNOSIS — Z20822 Contact with and (suspected) exposure to covid-19: Secondary | ICD-10-CM | POA: Insufficient documentation

## 2020-09-11 DIAGNOSIS — Z01812 Encounter for preprocedural laboratory examination: Secondary | ICD-10-CM | POA: Insufficient documentation

## 2020-09-11 LAB — CBC
HCT: 35.2 % — ABNORMAL LOW (ref 36.0–46.0)
Hemoglobin: 12.1 g/dL (ref 12.0–15.0)
MCH: 30.3 pg (ref 26.0–34.0)
MCHC: 34.4 g/dL (ref 30.0–36.0)
MCV: 88.2 fL (ref 80.0–100.0)
Platelets: 250 10*3/uL (ref 150–400)
RBC: 3.99 MIL/uL (ref 3.87–5.11)
RDW: 13.1 % (ref 11.5–15.5)
WBC: 7.8 10*3/uL (ref 4.0–10.5)
nRBC: 0 % (ref 0.0–0.2)

## 2020-09-11 LAB — TYPE AND SCREEN
ABO/RH(D): A POS
Antibody Screen: NEGATIVE
Extend sample reason: UNDETERMINED

## 2020-09-11 LAB — SARS CORONAVIRUS 2 (TAT 6-24 HRS): SARS Coronavirus 2: NEGATIVE

## 2020-09-12 ENCOUNTER — Inpatient Hospital Stay: Payer: BLUE CROSS/BLUE SHIELD | Admitting: Urgent Care

## 2020-09-12 ENCOUNTER — Encounter
Admission: RE | Disposition: A | Discharge status: Home / Self Care | Payer: Self-pay | Source: Home / Self Care | Attending: Obstetrics and Gynecology

## 2020-09-12 ENCOUNTER — Inpatient Hospital Stay
Admission: RE | Admit: 2020-09-12 | Discharge: 2020-09-14 | DRG: 788 | Disposition: A | Discharge status: Home / Self Care | Payer: BLUE CROSS/BLUE SHIELD | Attending: Obstetrics and Gynecology | Admitting: Obstetrics and Gynecology

## 2020-09-12 ENCOUNTER — Encounter: Payer: Self-pay | Admitting: Obstetrics and Gynecology

## 2020-09-12 DIAGNOSIS — Z3A40 40 weeks gestation of pregnancy: Secondary | ICD-10-CM | POA: Diagnosis not present

## 2020-09-12 DIAGNOSIS — O26893 Other specified pregnancy related conditions, third trimester: Secondary | ICD-10-CM | POA: Diagnosis present

## 2020-09-12 DIAGNOSIS — O480 Post-term pregnancy: Secondary | ICD-10-CM | POA: Diagnosis not present

## 2020-09-12 DIAGNOSIS — Z3A41 41 weeks gestation of pregnancy: Secondary | ICD-10-CM

## 2020-09-12 DIAGNOSIS — N3289 Other specified disorders of bladder: Secondary | ICD-10-CM

## 2020-09-12 DIAGNOSIS — O34211 Maternal care for low transverse scar from previous cesarean delivery: Principal | ICD-10-CM | POA: Diagnosis present

## 2020-09-12 DIAGNOSIS — O34219 Maternal care for unspecified type scar from previous cesarean delivery: Secondary | ICD-10-CM | POA: Diagnosis present

## 2020-09-12 DIAGNOSIS — N736 Female pelvic peritoneal adhesions (postinfective): Secondary | ICD-10-CM

## 2020-09-12 DIAGNOSIS — Z20822 Contact with and (suspected) exposure to covid-19: Secondary | ICD-10-CM | POA: Diagnosis present

## 2020-09-12 DIAGNOSIS — O99892 Other specified diseases and conditions complicating childbirth: Secondary | ICD-10-CM

## 2020-09-12 DIAGNOSIS — O99824 Streptococcus B carrier state complicating childbirth: Secondary | ICD-10-CM | POA: Diagnosis present

## 2020-09-12 LAB — COMPREHENSIVE METABOLIC PANEL
ALT: 13 U/L (ref 0–44)
AST: 22 U/L (ref 15–41)
Albumin: 2.6 g/dL — ABNORMAL LOW (ref 3.5–5.0)
Alkaline Phosphatase: 104 U/L (ref 38–126)
Anion gap: 6 (ref 5–15)
BUN: 10 mg/dL (ref 6–20)
CO2: 23 mmol/L (ref 22–32)
Calcium: 8.2 mg/dL — ABNORMAL LOW (ref 8.9–10.3)
Chloride: 107 mmol/L (ref 98–111)
Creatinine, Ser: 0.74 mg/dL (ref 0.44–1.00)
GFR, Estimated: >60 mL/min (ref >60)
Glucose, Bld: 93 mg/dL (ref 70–99)
Potassium: 4.3 mmol/L (ref 3.5–5.1)
Sodium: 136 mmol/L (ref 135–145)
Total Bilirubin: 0.5 mg/dL (ref 0.3–1.2)
Total Protein: 5.8 g/dL — ABNORMAL LOW (ref 6.5–8.1)

## 2020-09-12 LAB — CBC
HCT: 35 % — ABNORMAL LOW (ref 36.0–46.0)
Hemoglobin: 12.1 g/dL (ref 12.0–15.0)
MCH: 30.3 pg (ref 26.0–34.0)
MCHC: 34.6 g/dL (ref 30.0–36.0)
MCV: 87.7 fL (ref 80.0–100.0)
Platelets: 247 10*3/uL (ref 150–400)
RBC: 3.99 MIL/uL (ref 3.87–5.11)
RDW: 13.2 % (ref 11.5–15.5)
WBC: 12.9 10*3/uL — ABNORMAL HIGH (ref 4.0–10.5)
nRBC: 0 % (ref 0.0–0.2)

## 2020-09-12 LAB — PROTEIN / CREATININE RATIO, URINE
Creatinine, Urine: 209 mg/dL
Protein Creatinine Ratio: 0.19 mg/mg{Cre} — ABNORMAL HIGH (ref 0.00–0.15)
Total Protein, Urine: 40 mg/dL

## 2020-09-12 LAB — RPR: RPR Ser Ql: NONREACTIVE

## 2020-09-12 SURGERY — Surgical Case
Anesthesia: Spinal

## 2020-09-12 MED ORDER — KETOROLAC TROMETHAMINE 30 MG/ML IJ SOLN
INTRAMUSCULAR | Status: DC | PRN
  Administered 2020-09-12: 30 mg via INTRAVENOUS

## 2020-09-12 MED ORDER — SOD CITRATE-CITRIC ACID 500-334 MG/5ML PO SOLN
30.0000 mL | ORAL | Status: AC
  Administered 2020-09-12: 30 mL via ORAL
  Filled 2020-09-12: qty 15

## 2020-09-12 MED ORDER — ONDANSETRON HCL 4 MG/2ML IJ SOLN
INTRAMUSCULAR | Status: AC
  Filled 2020-09-12: qty 2

## 2020-09-12 MED ORDER — MEPERIDINE HCL 25 MG/ML IJ SOLN
6.2500 mg | INTRAMUSCULAR | Status: DC | PRN
Start: 2020-09-12 — End: 2020-09-12

## 2020-09-12 MED ORDER — OXYTOCIN-SODIUM CHLORIDE 30-0.9 UT/500ML-% IV SOLN
INTRAVENOUS | Status: DC | PRN
  Administered 2020-09-12: 600 mL/h via INTRAVENOUS
  Administered 2020-09-12: 250 mL via INTRAVENOUS

## 2020-09-12 MED ORDER — KETOROLAC TROMETHAMINE 30 MG/ML IJ SOLN: 30.0000 mg | Freq: Four times a day (QID) | INTRAMUSCULAR | Status: AC | PRN

## 2020-09-12 MED ORDER — DIPHENHYDRAMINE HCL 25 MG PO CAPS: 25.0000 mg | ORAL_CAPSULE | Freq: Four times a day (QID) | ORAL | Status: DC | PRN

## 2020-09-12 MED ORDER — WITCH HAZEL-GLYCERIN EX PADS: 1.0000 "application " | MEDICATED_PAD | CUTANEOUS | Status: DC | PRN

## 2020-09-12 MED ORDER — SCOPOLAMINE 1 MG/3DAYS TD PT72: 1.0000 | MEDICATED_PATCH | Freq: Once | TRANSDERMAL | Status: DC

## 2020-09-12 MED ORDER — HEMOSTATIC AGENTS (NO CHARGE) OPTIME
TOPICAL | Status: DC | PRN
  Administered 2020-09-12: 1 via TOPICAL

## 2020-09-12 MED ORDER — OXYTOCIN-SODIUM CHLORIDE 30-0.9 UT/500ML-% IV SOLN
INTRAVENOUS | Status: AC
  Filled 2020-09-12: qty 500

## 2020-09-12 MED ORDER — BUPIVACAINE 0.25 % ON-Q PUMP DUAL CATH 400 ML
400.0000 mL | INJECTION | Status: DC
  Filled 2020-09-12: qty 400

## 2020-09-12 MED ORDER — LACTATED RINGERS IV SOLN: Freq: Once | INTRAVENOUS | Status: AC

## 2020-09-12 MED ORDER — OXYTOCIN-SODIUM CHLORIDE 30-0.9 UT/500ML-% IV SOLN
2.5000 [IU]/h | INTRAVENOUS | Status: AC
  Administered 2020-09-12 (×2): 2.5 [IU]/h via INTRAVENOUS
  Filled 2020-09-12: qty 500

## 2020-09-12 MED ORDER — OXYCODONE-ACETAMINOPHEN 5-325 MG PO TABS: 1.0000 | ORAL_TABLET | ORAL | Status: DC | PRN

## 2020-09-12 MED ORDER — PHENYLEPHRINE HCL (PRESSORS) 10 MG/ML IV SOLN
INTRAVENOUS | Status: AC
  Filled 2020-09-12: qty 1

## 2020-09-12 MED ORDER — MORPHINE SULFATE (PF) 0.5 MG/ML IJ SOLN
INTRAMUSCULAR | Status: AC
  Filled 2020-09-12: qty 10

## 2020-09-12 MED ORDER — DIPHENHYDRAMINE HCL 25 MG PO CAPS: 25.0000 mg | ORAL_CAPSULE | ORAL | Status: DC | PRN

## 2020-09-12 MED ORDER — NALOXONE HCL 0.4 MG/ML IJ SOLN: 0.4000 mg | INTRAMUSCULAR | Status: DC | PRN

## 2020-09-12 MED ORDER — DIPHENHYDRAMINE HCL 50 MG/ML IJ SOLN: 12.5000 mg | INTRAMUSCULAR | Status: DC | PRN

## 2020-09-12 MED ORDER — CEFAZOLIN SODIUM-DEXTROSE 2-4 GM/100ML-% IV SOLN
2.0000 g | INTRAVENOUS | Status: AC
  Administered 2020-09-12: 2 g via INTRAVENOUS
  Filled 2020-09-12: qty 100

## 2020-09-12 MED ORDER — IBUPROFEN 800 MG PO TABS
800.0000 mg | ORAL_TABLET | Freq: Three times a day (TID) | ORAL | Status: DC
  Administered 2020-09-13 – 2020-09-14 (×4): 800 mg via ORAL
  Filled 2020-09-12 (×4): qty 1

## 2020-09-12 MED ORDER — LIDOCAINE HCL (PF) 1 % IJ SOLN
INTRAMUSCULAR | Status: DC | PRN
  Administered 2020-09-12: 2 mL via SUBCUTANEOUS

## 2020-09-12 MED ORDER — BUPIVACAINE HCL (PF) 0.5 % IJ SOLN
20.0000 mL | INTRAMUSCULAR | Status: AC
  Filled 2020-09-12: qty 30

## 2020-09-12 MED ORDER — NALBUPHINE HCL 10 MG/ML IJ SOLN: 5.0000 mg | INTRAMUSCULAR | Status: DC | PRN

## 2020-09-12 MED ORDER — MENTHOL 3 MG MT LOZG
1.0000 | LOZENGE | OROMUCOSAL | Status: DC | PRN
  Filled 2020-09-12: qty 9

## 2020-09-12 MED ORDER — ONDANSETRON HCL 4 MG/2ML IJ SOLN: 4.0000 mg | Freq: Three times a day (TID) | INTRAMUSCULAR | Status: DC | PRN

## 2020-09-12 MED ORDER — ACETAMINOPHEN 500 MG PO TABS
1000.0000 mg | ORAL_TABLET | Freq: Four times a day (QID) | ORAL | Status: DC | PRN
  Administered 2020-09-12 – 2020-09-13 (×2): 1000 mg via ORAL
  Filled 2020-09-12 (×2): qty 2

## 2020-09-12 MED ORDER — LACTATED RINGERS IV SOLN: Freq: Once | INTRAVENOUS | Status: DC

## 2020-09-12 MED ORDER — FENTANYL CITRATE (PF) 100 MCG/2ML IJ SOLN
INTRAMUSCULAR | Status: AC
  Filled 2020-09-12: qty 2

## 2020-09-12 MED ORDER — EPHEDRINE SULFATE-NACL 50-0.9 MG/10ML-% IV SOSY
PREFILLED_SYRINGE | INTRAVENOUS | Status: DC | PRN
  Administered 2020-09-12 (×2): 5 mg via INTRAVENOUS
  Administered 2020-09-12: 10 mg via INTRAVENOUS

## 2020-09-12 MED ORDER — MORPHINE SULFATE (PF) 0.5 MG/ML IJ SOLN
INTRAMUSCULAR | Status: DC | PRN
  Administered 2020-09-12: 100 ug via INTRATHECAL

## 2020-09-12 MED ORDER — FENTANYL CITRATE (PF) 100 MCG/2ML IJ SOLN
INTRAMUSCULAR | Status: DC | PRN
  Administered 2020-09-12: 15 ug via INTRATHECAL

## 2020-09-12 MED ORDER — DIBUCAINE (PERIANAL) 1 % EX OINT: 1.0000 "application " | TOPICAL_OINTMENT | CUTANEOUS | Status: DC | PRN

## 2020-09-12 MED ORDER — EPHEDRINE 5 MG/ML INJ
INTRAVENOUS | Status: AC
  Filled 2020-09-12: qty 10

## 2020-09-12 MED ORDER — BUPIVACAINE HCL (PF) 0.5 % IJ SOLN
INTRAMUSCULAR | Status: DC | PRN
  Administered 2020-09-12: 10 mL

## 2020-09-12 MED ORDER — ONDANSETRON HCL 4 MG/2ML IJ SOLN
INTRAMUSCULAR | Status: DC | PRN
  Administered 2020-09-12: 4 mg via INTRAVENOUS

## 2020-09-12 MED ORDER — LACTATED RINGERS IV SOLN: INTRAVENOUS | Status: DC

## 2020-09-12 MED ORDER — NALOXONE HCL 4 MG/10ML IJ SOLN
1.0000 ug/kg/h | INTRAVENOUS | Status: DC | PRN
  Filled 2020-09-12: qty 5

## 2020-09-12 MED ORDER — NALBUPHINE HCL 10 MG/ML IJ SOLN: 5.0000 mg | Freq: Once | INTRAMUSCULAR | Status: DC | PRN

## 2020-09-12 MED ORDER — SENNOSIDES-DOCUSATE SODIUM 8.6-50 MG PO TABS
2.0000 | ORAL_TABLET | ORAL | Status: DC
  Administered 2020-09-12 – 2020-09-13 (×2): 2 via ORAL
  Filled 2020-09-12 (×2): qty 2

## 2020-09-12 MED ORDER — KETOROLAC TROMETHAMINE 30 MG/ML IJ SOLN
30.0000 mg | Freq: Four times a day (QID) | INTRAMUSCULAR | Status: AC | PRN
  Administered 2020-09-12 – 2020-09-13 (×3): 30 mg via INTRAVENOUS
  Filled 2020-09-12 (×3): qty 1

## 2020-09-12 MED ORDER — COCONUT OIL OIL
1.0000 "application " | TOPICAL_OIL | Status: DC | PRN
  Filled 2020-09-12: qty 120

## 2020-09-12 MED ORDER — SIMETHICONE 80 MG PO CHEW
80.0000 mg | CHEWABLE_TABLET | Freq: Three times a day (TID) | ORAL | Status: DC
  Administered 2020-09-12 – 2020-09-13 (×5): 80 mg via ORAL
  Filled 2020-09-12 (×7): qty 1

## 2020-09-12 MED ORDER — SODIUM CHLORIDE 0.9% FLUSH: 3.0000 mL | INTRAVENOUS | Status: DC | PRN

## 2020-09-12 MED ORDER — PRENATAL MULTIVITAMIN CH
1.0000 | ORAL_TABLET | Freq: Every day | ORAL | Status: DC
  Administered 2020-09-12 – 2020-09-13 (×2): 1 via ORAL
  Filled 2020-09-12 (×2): qty 1

## 2020-09-12 MED ORDER — SIMETHICONE 80 MG PO CHEW: 80.0000 mg | CHEWABLE_TABLET | ORAL | Status: DC | PRN

## 2020-09-12 MED ORDER — SODIUM CHLORIDE 0.9 % IV SOLN
INTRAVENOUS | Status: DC | PRN
  Administered 2020-09-12: 50 ug/min via INTRAVENOUS

## 2020-09-12 MED ORDER — BUPIVACAINE IN DEXTROSE 0.75-8.25 % IT SOLN
INTRATHECAL | Status: DC | PRN
  Administered 2020-09-12: 1.6 mL via INTRATHECAL

## 2020-09-12 SURGICAL SUPPLY — 41 items
ADH SKN CLS APL DERMABOND .7 (GAUZE/BANDAGES/DRESSINGS) ×1
APL PRP STRL LF DISP 70% ISPRP (MISCELLANEOUS) ×2
BAG COUNTER SPONGE EZ (MISCELLANEOUS) ×2 IMPLANT
BAG SPNG 4X4 CLR HAZ (MISCELLANEOUS) ×1
BARRIER ADHS 3X4 INTERCEED (GAUZE/BANDAGES/DRESSINGS) ×1 IMPLANT
BRR ADH 4X3 ABS CNTRL BYND (GAUZE/BANDAGES/DRESSINGS) ×1
CANISTER SUCT 3000ML PPV (MISCELLANEOUS) ×2 IMPLANT
CATH KIT ON-Q SILVERSOAK 5 (CATHETERS) ×2 IMPLANT
CATH KIT ON-Q SILVERSOAK 5IN (CATHETERS) ×4 IMPLANT
CHLORAPREP W/TINT 26 (MISCELLANEOUS) ×4 IMPLANT
DERMABOND ADVANCED (GAUZE/BANDAGES/DRESSINGS) ×1
DERMABOND ADVANCED .7 DNX12 (GAUZE/BANDAGES/DRESSINGS) ×1 IMPLANT
DRESSING SURGICEL FIBRLLR 1X2 (HEMOSTASIS) IMPLANT
DRSG OPSITE POSTOP 4X10 (GAUZE/BANDAGES/DRESSINGS) ×2 IMPLANT
DRSG SURGICEL FIBRILLAR 1X2 (HEMOSTASIS) ×2
DRSG TELFA 3X8 NADH (GAUZE/BANDAGES/DRESSINGS) ×2 IMPLANT
ELECT CAUTERY BLADE 6.4 (BLADE) ×2 IMPLANT
ELECT REM PT RETURN 9FT ADLT (ELECTROSURGICAL) ×2
ELECTRODE REM PT RTRN 9FT ADLT (ELECTROSURGICAL) ×1 IMPLANT
GAUZE SPONGE 4X4 12PLY STRL (GAUZE/BANDAGES/DRESSINGS) ×2 IMPLANT
GLOVE SURG ENC MOIS LTX SZ7 (GLOVE) ×2 IMPLANT
GLOVE SURG UNDER LTX SZ7.5 (GLOVE) ×2 IMPLANT
GOWN STRL REUS W/ TWL LRG LVL3 (GOWN DISPOSABLE) ×3 IMPLANT
GOWN STRL REUS W/TWL LRG LVL3 (GOWN DISPOSABLE) ×6
HEMOSTAT SURGICEL 2X3 (HEMOSTASIS) ×1 IMPLANT
MANIFOLD NEPTUNE II (INSTRUMENTS) ×2 IMPLANT
MAT PREVALON FULL STRYKER (MISCELLANEOUS) ×2 IMPLANT
NS IRRIG 1000ML POUR BTL (IV SOLUTION) ×2 IMPLANT
PACK C SECTION AR (MISCELLANEOUS) ×2 IMPLANT
PAD DRESSING TELFA 3X8 NADH (GAUZE/BANDAGES/DRESSINGS) ×1 IMPLANT
PAD OB MATERNITY 4.3X12.25 (PERSONAL CARE ITEMS) ×2 IMPLANT
PAD PREP 24X41 OB/GYN DISP (PERSONAL CARE ITEMS) ×2 IMPLANT
PENCIL SMOKE EVACUATOR (MISCELLANEOUS) ×2 IMPLANT
STRIP CLOSURE SKIN 1/2X4 (GAUZE/BANDAGES/DRESSINGS) ×2 IMPLANT
SUT MNCRL AB 4-0 PS2 18 (SUTURE) ×2 IMPLANT
SUT PDS AB 1 TP1 96 (SUTURE) ×4 IMPLANT
SUT VIC AB 0 CTX 36 (SUTURE) ×4
SUT VIC AB 0 CTX36XBRD ANBCTRL (SUTURE) ×2 IMPLANT
SUT VIC AB 2-0 CT1 27 (SUTURE) ×2
SUT VIC AB 2-0 CT1 36 (SUTURE) ×2 IMPLANT
SUT VIC AB 2-0 CT1 TAPERPNT 27 (SUTURE) IMPLANT

## 2020-09-12 NOTE — Interval H&P Note (Signed)
History and Physical Interval Note:  09/12/2020 7:32 AM  Tiffany Conner  has presented today for surgery, with the diagnosis of Hx of cesarean section.  The various methods of treatment have been discussed with the patient and family. After consideration of risks, benefits and other options for treatment, the patient has consented to  Procedure(s): CESAREAN SECTION (N/A) as a surgical intervention.  The patient's history has been reviewed, patient examined, no change in status, stable for surgery.  I have reviewed the patient's chart and labs.  Questions were answered to the patient's satisfaction.     Vena Austria

## 2020-09-12 NOTE — Anesthesia Procedure Notes (Signed)
Spinal  Patient location during procedure: OR Start time: 09/12/2020 7:55 AM End time: 09/12/2020 7:57 AM Reason for block: surgical anesthesia Staffing Anesthesiologist: Howell, Scott T, MD Resident/CRNA: Brown, Kristina C, CRNA Preanesthetic Checklist Completed: patient identified, IV checked, site marked, risks and benefits discussed, surgical consent, monitors and equipment checked, pre-op evaluation and timeout performed Spinal Block Patient position: sitting Prep: Betadine Patient monitoring: heart rate, continuous pulse ox, blood pressure and cardiac monitor Approach: midline Location: L4-5 Injection technique: single-shot Needle Needle type: Whitacre and Introducer  Needle gauge: 24 G Needle length: 9 cm Assessment Events: CSF return Additional Notes Negative paresthesia. Negative blood return. Positive free-flowing CSF. Expiration date of kit checked and confirmed. Patient tolerated procedure well, without complications.       

## 2020-09-12 NOTE — Discharge Summary (Signed)
OB Discharge Summary     Patient Name: Tiffany Conner DOB: [birth year] MRN: [id number]  Date of admission: 09/12/2020 Delivering MD: Vena Austria   Date of discharge: 09/14/2020  Admitting diagnosis: Uterine scar from previous cesarean delivery affecting pregnancy [O34.219] Intrauterine pregnancy: [redacted]w[redacted]d     Secondary diagnosis:  Active Problems:   Uterine scar from previous cesarean delivery affecting pregnancy   Postpartum care following cesarean delivery  Additional problems: none     Discharge diagnosis: Term Pregnancy Delivered                                                                                                Post partum procedures:NA  Augmentation: N/A  Complications: None  Hospital course:  Sceduled C/S   31 y.o. yo G2P2002 at [redacted]w[redacted]d was admitted to the hospital 09/12/2020 for scheduled cesarean section with the following indication:Elective Repeat.Delivery details are as follows:  Membrane Rupture Time/Date: 8:31 AM ,09/12/2020   Delivery Method:C-Section, Low Transverse  Details of operation can be found in separate operative note.  Patient had an uncomplicated postpartum course.  She is ambulating, tolerating a regular diet, passing flatus, and urinating well. Patient is discharged home in stable condition on  09/14/20        Newborn Data: Birth date:09/12/2020  Birth time:8:32 AM  Gender:Female  Living status:Living  Apgars:8 ,9  Weight:3660 g     Physical exam  Vitals:   09/13/20 1950 09/13/20 2311 09/14/20 0320 09/14/20 0826  BP: (!) 142/84 (!) 144/79 (!) 145/85 138/78  Pulse: 86 94 72 73  Resp: 18 18 18 20   Temp: 98.3 F (36.8 C) 98.2 F (36.8 C) 98.2 F (36.8 C) 98.6 F (37 C)  TempSrc: Oral Oral Oral Oral  SpO2: 100% 100% 100% 100%  Weight:      Height:       General: alert, cooperative and no distress Lochia: appropriate Uterine Fundus: firm Incision: Healing well with no significant drainage, Dressing is clean, dry, and  intact, had some early darinage- incision on left edge resealed with surgical glue DVT Evaluation: No evidence of DVT seen on physical exam. Negative Homan's sign. Labs: Lab Results  Component Value Date   WBC 9.6 09/13/2020   HGB 10.9 (L) 09/13/2020   HCT 31.8 (L) 09/13/2020   MCV 88.8 09/13/2020   PLT 219 09/13/2020   CMP Latest Ref Rng & Units 09/12/2020  Glucose 70 - 99 mg/dL 93  BUN 6 - 20 mg/dL 10  Creatinine 09/14/2020 - 4.26 mg/dL 8.34  Sodium 1.96 - 222 mmol/L 136  Potassium 3.5 - 5.1 mmol/L 4.3  Chloride 98 - 111 mmol/L 107  CO2 22 - 32 mmol/L 23  Calcium 8.9 - 10.3 mg/dL 8.2(L)  Total Protein 6.5 - 8.1 g/dL 979)  Total Bilirubin 0.3 - 1.2 mg/dL 0.5  Alkaline Phos 38 - 126 U/L 104  AST 15 - 41 U/L 22  ALT 0 - 44 U/L 13    Discharge instruction: per After Visit Summary and "Baby and Me Booklet".  After visit meds:  Allergies as of 09/14/2020   No Known  Allergies     Medication List    TAKE these medications   ibuprofen 800 MG tablet Commonly known as: ADVIL Take 1 tablet (800 mg total) by mouth every 8 (eight) hours.   multivitamin-prenatal 27-0.8 MG Tabs tablet Take 1 tablet by mouth daily.   oxyCODONE-acetaminophen 5-325 MG tablet Commonly known as: PERCOCET/ROXICET Take 1-2 tablets by mouth every 4 (four) hours as needed for moderate pain or severe pain ((when tolerating fluids)).            Discharge Care Instructions  (From admission, onward)         Start     Ordered   09/14/20 0000  Leave dressing on - Keep it clean, dry, and intact until clinic visit        09/14/20 1037          Diet: routine diet  Activity: Advance as tolerated. Pelvic rest for 6 weeks.   Outpatient follow up:2 weeks Follow up Appt: Future Appointments  Date Time Provider Department Center  09/23/2020  2:50 PM Vena Austria, MD [facility] None   Follow up Visit:No follow-ups on file.  Postpartum contraception: Progesterone only pills  Newborn Data: Live born  female  Birth Weight: 8 lb 1.1 oz (3660 g) APGAR: 8, 9  Newborn Delivery   Birth date/time: 09/12/2020 08:32:00 Delivery type: C-Section, Low Transverse Trial of labor: No C-section categorization: Repeat      Baby Feeding: Breast Disposition:home with mother  Rx for Motrin and Rx for limited Roxicodone   09/14/2020 Mirna Mires, CNM  09/14/2020 10:40 AM

## 2020-09-12 NOTE — Op Note (Signed)
Preoperative Diagnosis: 1) 30 y.o. L8V5643 at [redacted]w[redacted]d with history of prior cesarean section  Postoperative Diagnosis: 1) 30 y.o. P2R5188 at [redacted]w[redacted]d with history of prior cesarean section 2) Extensive lysis of adhesions  Operation Performed: Repeat low transverse C-section via pfannenstiel skin incision  Indiciation: Elective repeat  Anesthesia: Spinal  Primary Surgeon: Vena Austria, MD   Assistant: Annamarie Major, MD this surgery required a high level surgical assistant with none other readily available  Preoperative Antibiotics: 2g Ancef  Estimated Blood Loss: IV Fluids:  Urine Output::  Drains or Tubes: Foley to gravity drainage, ON-Q catheter system  Implants: none  Specimens Removed: none  Complications: none  Intraoperative Findings:  Normal tubes ovaries and uterus.  Delivery resulted in the birth of a liveborn female, APGAR (1 MIN): 8   APGAR (5 MINS): 9, weight 8lbs 1oz  Dense adhesive disease of the rectus to the lower uterine segment, bladder, and omentum.    Patient Condition:stable  Procedure in Detail:  Patient was taken to the operating room were she was administered regional anesthesia.  She was positioned in the supine position, prepped and draped in the  Usual sterile fashion.  Prior to proceeding with the case a time out was performed and the level of anesthetic was checked and noted to be adequate.  Utilizing the scalpel a pfannenstiel skin incision was made 2cm above the pubic symphysis utilizing the patient's pre-existing scar and carried down sharply to the the level of the rectus fascia.  The fascia was incised in the midline using the scalpel and then extended using mayo scissors.  The superior border of the rectus fascia was grasped with two Kocher clamps and the underlying rectus muscles were dissected of the fascia using blunt dissection.  The median raphae was incised using Mayo scissors.   The inferior border of the rectus fascia  was dissected of the rectus muscles in a similar fashion.  The midline was identified, the peritoneum was entered bluntly and expanded using manual tractions.  An omental adhesion was taken down on the right rectus muscle using Kelly clamps and 2-0 Vicryl free tie.  There was a dense adhesion of the bladder the the mid anterior uterus. A finger was able to be placed underneath the adhesion and the adhesion was taken down in layer using the Bovie.     Next the bladder blade was placed retracting the bladder caudally.  A bladder flap was created.  There was still minimal space secondary to rectus scarring and the left rectus and overlying scar sheet was incised fro about 1.5cm.  A low transverse incision was scored on the lower uterine segment.  The hysterotomy was entered bluntly using the operators finger.  The hysterotomy incision was extended using manual traction.  The operators hand was placed within the hysterotomy position noting the fetus to be within the ROA position.  The vertex was grasped, flexed, brought to the incision, and delivered a traumatically using fundal pressure applied by Dr. Tiburcio Pea.  The remainder of the body delivered with ease.  The infant was suctioned, cord was clamped and cut before handing off to the awaiting neonatologist.  The placenta was delivered using manual extraction.  The uterus was exteriorized, wiped clean of clots and debris using two moist laps.  The hysterotomy was closed using a two layer closure of 0 Vicryl, with the first being a running locked, the second a vertical imbricating.  The uterus was returned to the abdomen.  The peritoneal  gutters were wiped clean of clots and debris using two moist laps.  The hysterotomy incision was re-inspected with some oozing noted at the site of the mid anterior bladder adhesion take down.  This was cauterized with the bovie to achieve hemostatis.  The hysterotomy incision was otherwise hemostatic.  Fibrillar was applied the  adhesion site followed by a layer of Interceed.  The rectus muscles were inspected noted to be hemostatic.  The superior border of the rectus fascia was grasped with a Kocher clamp.  The ON-Q trocars were then placed 4cm above the superior border of the incision and tunneled subfascially.  The introducers were removed and the catheters were threaded through the sleeves after which the sleeves were removed.  The fascia was closed using a looped #1 PDS in a running fashion taking 1cm by 1cm bites.  The subcutaneous tissue was irrigated using warm saline, hemostasis achieved using the bovie.  The subcutaneous dead space was less than 3cm and was not closed.  The skin was closed using 4-0 Monocryl in a subcuticular fashion.  Sponge needle and instrument counts were corrects times two.  The patient tolerated the procedure well and was taken to the recovery room in stable condition.  Doctor Tiburcio Pea was essential is successfully performing the procedure by providing adequate visualization and retraction to safely taken down the encountered adhesions.

## 2020-09-12 NOTE — Transfer of Care (Signed)
Immediate Anesthesia Transfer of Care Note  Patient: Tiffany Conner  Procedure(s) Performed: CESAREAN SECTION (N/A )  Patient Location: Mother/Baby  Anesthesia Type:Spinal  Level of Consciousness: awake, alert  and oriented  Airway & Oxygen Therapy: Patient Spontanous Breathing  Post-op Assessment: Report given to RN and Post -op Vital signs reviewed and stable  Post vital signs: Reviewed and stable  Last Vitals:  Vitals Value Taken Time  BP 110/75 09/12/20 0938  Temp 36.6 C 09/12/20 0938  Pulse 79 09/12/20 0938  Resp 16 09/12/20 0938  SpO2 100 % 09/12/20 0938    Last Pain:  Vitals:   09/12/20 0938  TempSrc: Oral  PainSc: 0-No pain         Complications: No complications documented.

## 2020-09-12 NOTE — Anesthesia Preprocedure Evaluation (Addendum)
Anesthesia Evaluation  Patient identified by MRN, date of birth, ID band Patient awake    Reviewed: Allergy & Precautions, H&P , NPO status , reviewed documented beta blocker date and time   Airway Mallampati: II  TM Distance: >3 FB Neck ROM: full    Dental  (+) Teeth Intact   Pulmonary    Pulmonary exam normal        Cardiovascular + Valvular Problems/Murmurs  + Systolic murmurs 06/6107 ECHO IMPRESSIONS    1. Left ventricular ejection fraction, by estimation, is 60 to 65%. The  left ventricle has normal function. The left ventricle has no regional  wall motion abnormalities. Left ventricular diastolic parameters were  normal.  2. Small supracristal VSD noted. No significant aortic valve stenosis  noted.  3. Right ventricular systolic function is normal. The right ventricular  size is normal. Tricuspid regurgitation signal is inadequate for assessing  PA pressure.    Neuro/Psych    GI/Hepatic neg GERD  ,  Endo/Other    Renal/GU      Musculoskeletal   Abdominal   Peds  Hematology   Anesthesia Other Findings Past Medical History: No date: Elevated blood pressure reading No date: Heart murmur No date: VSD (ventricular septal defect - see echo report)   Past Surgical History: 02/27/2018: CESAREAN SECTION; N/A     Comment:  Procedure: CESAREAN SECTION;  Surgeon: Vena Austria, MD;  Location: [facility] ORS;  Service: Obstetrics;                Laterality: N/A; No date: MYRINGOTOMY     Comment:  as an infant No date: TIBIA FRACTURE SURGERY  BMI    Body Mass Index: 33.89 kg/m      Reproductive/Obstetrics                            Anesthesia Physical Anesthesia Plan  ASA: II  Anesthesia Plan: Spinal   Post-op Pain Management:    Induction:   PONV Risk Score and Plan: 2 and Ondansetron and Treatment may vary due to age or medical condition  Airway Management  Planned: Nasal Cannula and Natural Airway  Additional Equipment:   Intra-op Plan:   Post-operative Plan:   Informed Consent: I have reviewed the patients History and Physical, chart, labs and discussed the procedure including the risks, benefits and alternatives for the proposed anesthesia with the patient or authorized representative who has indicated his/her understanding and acceptance.     Dental Advisory Given  Plan Discussed with: CRNA  Anesthesia Plan Comments:        Anesthesia Quick Evaluation

## 2020-09-12 NOTE — Lactation Note (Signed)
This note was copied from a baby's chart. Lactation Consultation Note  Patient Name: Tiffany Conner Today's Date: 09/12/2020 Reason for consult: Initial assessment Age:29 hours    LC and LC student to room for initial consult. P2 mom and maternal grandmother present in room. Baby was latched to the right breast in cross cradle hold. Mom needed no assistance with latching and positioning. Baby had flanged lips and audible swallows were noted. Mom was taught hand expression; mom easily expressed drops of colostrum. [facility] student reviewed normal newborn behavior in the first 24 hours; it is normal for babies to be sleepy and educated mother on the first 24 hours; sleepiness, feeding cues, normal output, breast milk intake per feeding, and cluster feeding around 24 hour period. Mother is aware of lactation services. Upon leaving the room, baby was still feeding on the right breast. Applauded mom for her efforts.   Feeding plan: 1. Skin to skin 2. Feed baby on demand at least 8-12 times a day x-cradle position 4. Hand express for stimulation after feedings and feed back to baby 5. Stay hydrated, rest, eat balanced meals 6. Contact LC services if needed prior to next consultation   Maternal Data Has patient been taught Hand Expression?: Yes Does the patient have breastfeeding experience prior to this delivery?: Yes How long did the patient breastfeed?: 1 year  Feeding Mother's Current Feeding Choice: Breast Milk  LATCH Score Latch: Grasps breast easily, tongue down, lips flanged, rhythmical sucking.  Audible Swallowing: Spontaneous and intermittent  Type of Nipple: Everted at rest and after stimulation  Comfort (Breast/Nipple): Soft / non-tender  Hold (Positioning): No assistance needed to correctly position infant at breast.  LATCH Score: 10   Lactation Tools Discussed/Used    Interventions Interventions: Breast feeding basics reviewed;Skin to skin;Hand express;Breast  massage;Education  Discharge Pump: Personal WIC Program: No  Consult Status Consult Status: Follow-up Date: 09/13/20 Follow-up type: In-patient    Kawan Valladolid 09/12/2020, 1:16 PM

## 2020-09-12 NOTE — Anesthesia Postprocedure Evaluation (Signed)
Anesthesia Post Note  Patient: Jennier Schissler  Procedure(s) Performed: CESAREAN SECTION (N/A )  Patient location during evaluation: Mother Baby Anesthesia Type: Spinal Level of consciousness: oriented and awake and alert Pain management: pain level controlled Vital Signs Assessment: post-procedure vital signs reviewed and stable Respiratory status: spontaneous breathing and respiratory function stable Cardiovascular status: blood pressure returned to baseline and stable Postop Assessment: no headache, no backache, no apparent nausea or vomiting and able to ambulate Anesthetic complications: no   No complications documented.   Last Vitals:  Vitals:   09/12/20 1209 09/12/20 1300  BP: 108/74 129/72  Pulse: 72 89  Resp: 18 20  Temp: 36.7 C 36.8 C  SpO2: 98% 99%    Last Pain:  Vitals:   09/12/20 1205  TempSrc:   PainSc: 0-No pain                 Christia Reading

## 2020-09-13 ENCOUNTER — Encounter: Payer: Self-pay | Admitting: Obstetrics and Gynecology

## 2020-09-13 LAB — CBC
HCT: 31.8 % — ABNORMAL LOW (ref 36.0–46.0)
Hemoglobin: 10.9 g/dL — ABNORMAL LOW (ref 12.0–15.0)
MCH: 30.4 pg (ref 26.0–34.0)
MCHC: 34.3 g/dL (ref 30.0–36.0)
MCV: 88.8 fL (ref 80.0–100.0)
Platelets: 219 10*3/uL (ref 150–400)
RBC: 3.58 MIL/uL — ABNORMAL LOW (ref 3.87–5.11)
RDW: 13.3 % (ref 11.5–15.5)
WBC: 9.6 10*3/uL (ref 4.0–10.5)
nRBC: 0 % (ref 0.0–0.2)

## 2020-09-13 NOTE — Lactation Note (Addendum)
This note was copied from a baby's chart. Lactation Consultation Note  Patient Name: Tiffany Conner [id number] Date: 09/13/2020 Reason for consult: Follow-up assessment;Term Age:29 hours  Maternal Data Has patient been taught Hand Expression?: Yes Does the patient have breastfeeding experience prior to this delivery?: Yes How long did the patient breastfeed?: 1 yr  Feeding Mother's Current Feeding Choice: Breast Milk Baby on left breast in cradle hold when room entered, mom states baby has been feeding frequently all night, gassy, pulls off breast frequently and cries and roots and goes back to breast.  Mom encouraged that this normal behavior for a newborn.  Baby latches well and deeply, sl tender nipples,  mom given coconut oil with instruction in use.   LATCH Score Latch: Grasps breast easily, tongue down, lips flanged, rhythmical sucking.  Audible Swallowing: A few with stimulation  Type of Nipple: Everted at rest and after stimulation  Comfort (Breast/Nipple): Filling, red/small blisters or bruises, mild/mod discomfort  Hold (Positioning): No assistance needed to correctly position infant at breast.  LATCH Score: 8   Lactation Tools Discussed/Used  [facility] name and no written on white board.    Interventions Interventions: Coconut oil  Discharge Pump: Personal WIC Program: No  Consult Status Consult Status: PRN Date: 09/13/20 Follow-up type: In-patient    Dyann Kief 09/13/2020, 11:43 AM

## 2020-09-13 NOTE — Progress Notes (Signed)
Admit Date: 09/12/2020 Today's Date: 09/13/2020  Subjective: Postpartum Day 1: Cesarean Delivery Patient reports incisional pain, tolerating PO and no problems voiding.    Objective: Vital signs in last 24 hours: Temp:  [97.9 F (36.6 C)-98.7 F (37.1 C)] 98.3 F (36.8 C) (04/15 0733) Pulse Rate:  [67-93] 68 (04/15 0736) Resp:  [11-31] 18 (04/15 0733) BP: (108-152)/(58-113) 145/71 (04/15 0736) SpO2:  [96 %-100 %] 100 % (04/15 0733)  Physical Exam:  General: alert, cooperative and no distress Lochia: appropriate Uterine Fundus: firm Incision: healing well, no significant drainage, no dehiscence, no significant erythema DVT Evaluation: No evidence of DVT seen on physical exam. Negative Homan's sign. No cords or calf tenderness.  Recent Labs    09/12/20 1106 09/13/20 0608  HGB 12.1 10.9*  HCT 35.0* 31.8*    Assessment/Plan: Status post Cesarean section. Doing well postoperatively.  Continue current care. Breast feeding going well Monitor BPs; normal lab w/u for no preeclampsia Plans POP for birth control  Letitia Libra 09/13/2020, 8:58 AM

## 2020-09-14 MED ORDER — IBUPROFEN 800 MG PO TABS: 800.0000 mg | ORAL_TABLET | Freq: Three times a day (TID) | ORAL | 0 refills | Status: DC

## 2020-09-14 MED ORDER — OXYCODONE-ACETAMINOPHEN 5-325 MG PO TABS: 1.0000 | ORAL_TABLET | ORAL | 0 refills | Status: DC | PRN

## 2020-09-14 NOTE — Discharge Instructions (Signed)

## 2020-09-14 NOTE — Progress Notes (Signed)
Patient discharged home with infant. Discharge instructions, prescriptions and follow up appointment given to and reviewed with patient. Patient verbalized understanding. Pt wheeled out with infant by auxiliary.  

## 2020-09-16 ENCOUNTER — Encounter: Payer: Self-pay | Admitting: Obstetrics and Gynecology

## 2020-09-16 ENCOUNTER — Other Ambulatory Visit: Payer: Self-pay

## 2020-09-16 ENCOUNTER — Ambulatory Visit (INDEPENDENT_AMBULATORY_CARE_PROVIDER_SITE_OTHER): Payer: BLUE CROSS/BLUE SHIELD | Admitting: Obstetrics and Gynecology

## 2020-09-16 ENCOUNTER — Telehealth: Payer: Self-pay

## 2020-09-16 VITALS — BP 136/88 | Wt 192.0 lb

## 2020-09-16 DIAGNOSIS — R300 Dysuria: Secondary | ICD-10-CM

## 2020-09-16 LAB — POCT URINALYSIS DIPSTICK
Blood, UA: NEGATIVE
Glucose, UA: NEGATIVE
Ketones, UA: NEGATIVE
Leukocytes, UA: NEGATIVE
Nitrite, UA: NEGATIVE
Spec Grav, UA: 1.015 (ref 1.010–1.025)
pH, UA: 7 (ref 5.0–8.0)

## 2020-09-16 MED ORDER — CEPHALEXIN 500 MG PO TABS: 500.0000 mg | ORAL_TABLET | Freq: Four times a day (QID) | ORAL | 0 refills | Status: AC

## 2020-09-16 NOTE — Progress Notes (Signed)
Obstetrics & Gynecology Office Visit    Chief Complaint  Patient presents with  . Urinary Tract Infection    History of Present Illness: 30 y.o. G67P2002 female who is POD#4 from a repeat c-section for history of prior presents for dysuria that started a couple of days ago. She denies seeing blood in the urine. She has had urinary frequency. However, she is hydrating a lot due to breastfeeding. She denies fevers, chills, back pain. She had a UTI after her last c-section 2 years ago.  The infection started similarly.   She also notes mild bleeding from her incision site. She states that while in the hospital it was re-glued to help with bleeding.     Past Medical History:  Diagnosis Date  . Elevated blood pressure reading   . Heart murmur   . VSD (ventricular septal defect)     Past Surgical History:  Procedure Laterality Date  . CESAREAN SECTION N/A 02/27/2018   Procedure: CESAREAN SECTION;  Surgeon: Vena Austria, MD;  Location: [facility] ORS;  Service: Obstetrics;  Laterality: N/A;  . CESAREAN SECTION N/A 09/12/2020   Procedure: CESAREAN SECTION;  Surgeon: Vena Austria, MD;  Location: [facility] ORS;  Service: Obstetrics;  Laterality: N/A;  . MYRINGOTOMY     as an infant  . TIBIA FRACTURE SURGERY      Gynecologic History: No LMP recorded.  Obstetric History: [id number]  Family History  Problem Relation Age of Onset  . Ovarian cancer Other     Social History   Socioeconomic History  . Marital status: Married    Spouse name: Madelaine Bhat  . Number of children: Not on file  . Years of education: 38  . Highest education level: Not on file  Occupational History  . Occupation: ACC COORDINATOR  Tobacco Use  . Smoking status: Never Smoker  . Smokeless tobacco: Never Used  Vaping Use  . Vaping Use: Never used  Substance and Sexual Activity  . Alcohol use: No  . Drug use: No  . Sexual activity: Yes    Birth control/protection: Pill  Other Topics Concern  . Not on file  Social  History Narrative   Married.   Works as a Nurse, adult for [employer].    Graduate of [employer].   Enjoys reading exercise.    Social Determinants of Health   Financial Resource Strain: Not on file  Food Insecurity: Not on file  Transportation Needs: Not on file  Physical Activity: Not on file  Stress: Not on file  Social Connections: Not on file  Intimate Partner Violence: Not on file    No Known Allergies  Prior to Admission medications   Medication Sig Start Date End Date Taking? Authorizing Provider  ibuprofen (ADVIL) 800 MG tablet Take 1 tablet (800 mg total) by mouth every 8 (eight) hours. 09/14/20   Mirna Mires, CNM  oxyCODONE-acetaminophen (PERCOCET/ROXICET) 5-325 MG tablet Take 1-2 tablets by mouth every 4 (four) hours as needed for moderate pain or severe pain ((when tolerating fluids)). 09/14/20   Mirna Mires, CNM  Prenatal Vit-Fe Fumarate-FA (MULTIVITAMIN-PRENATAL) 27-0.8 MG TABS tablet Take 1 tablet by mouth daily.    [provider]    Review of Systems  Constitutional: Negative.   HENT: Negative.   Eyes: Negative.   Respiratory: Negative.   Cardiovascular: Negative.   Gastrointestinal: Negative.   Genitourinary: Negative.   Musculoskeletal: Negative.   Skin: Negative.   Neurological: Negative.   Psychiatric/Behavioral: Negative.      Physical Exam BP  136/88   Wt 192 lb (87.1 kg)   BMI 30.99 kg/m  No LMP recorded. Physical Exam Constitutional:      General: She is not in acute distress.    Appearance: Normal appearance.  HENT:     Head: Normocephalic and atraumatic.  Eyes:     General: No scleral icterus.    Conjunctiva/sclera: Conjunctivae normal.  Abdominal:     Palpations: Abdomen is soft. There is mass (fundus at U-2).     Tenderness: There is no abdominal tenderness. There is no right CVA tenderness or left CVA tenderness.     Comments: Incision: without erythema, induration, warmth, and tenderness. It is clean, dry, and  intact.  There is a small amount of blood that can be expressed on the mid-right aspect of the incision.  A pressure bandage was applied to this area and instructions for management were given.   Neurological:     General: No focal deficit present.     Mental Status: She is alert and oriented to person, place, and time.     Cranial Nerves: No cranial nerve deficit.  Psychiatric:        Mood and Affect: Mood normal.        Behavior: Behavior normal.        Judgment: Judgment normal.     Female chaperone present for pelvic and breast  portions of the physical exam  Assessment: 29 y.o. G73P2002 female here for  1. Dysuria      Plan: Problem List Items Addressed This Visit   None   Visit Diagnoses    Dysuria    -  Primary   Relevant Medications   Cephalexin 500 MG tablet   Other Relevant Orders   POCT Urinalysis Dipstick (Completed)   Urine Culture     Will send urine for culture.  ABx sent just in case symptoms worse over next few days prior to return of the urine culture result.   Thomasene Mohair, MD 09/16/2020 4:59 PM

## 2020-09-16 NOTE — Telephone Encounter (Signed)
Pt calling; has d/x on the 14; took dressing off today; had a little bit of bleeding; seems to have stopped; also thinks she has a UTI.  Be seen or rx antibx?  [id number]  Pt states she took to OnQ pump out today and the dressing needed to come off with it; it's the same spot that was reglued in the hospital.  Adv to keep covered with gauze/papertape or pad.  Pt's uti sxs are pain c urination; states had UTI c last c/s and this feels the same way.  Tx'd to [facility] for scheduling.

## 2020-09-20 LAB — URINE CULTURE

## 2020-09-23 ENCOUNTER — Encounter: Payer: Self-pay | Admitting: Obstetrics and Gynecology

## 2020-09-23 ENCOUNTER — Ambulatory Visit (INDEPENDENT_AMBULATORY_CARE_PROVIDER_SITE_OTHER): Payer: BLUE CROSS/BLUE SHIELD | Admitting: Obstetrics and Gynecology

## 2020-09-23 ENCOUNTER — Other Ambulatory Visit: Payer: Self-pay

## 2020-09-23 VITALS — BP 118/68 | Wt 184.0 lb

## 2020-09-23 DIAGNOSIS — Z4889 Encounter for other specified surgical aftercare: Secondary | ICD-10-CM

## 2020-09-23 NOTE — Progress Notes (Signed)
      Postoperative Follow-up Patient presents post op from RLTCS 1weeks ago for repeat.  Subjective: Patient reports marked improvement in her preop symptoms. Eating a regular diet without difficulty. Pain is controlled without any medications.  Activity: normal activities of daily living.  Treated for UTI last week.  Has had some bleeding right aspect of incision which has since discontinued.    Objective: Blood pressure 118/68, weight 184 lb (83.5 kg), unknown if currently breastfeeding.  General: NAD Pulmonary: no increased work of breathing Abdomen: soft, non-tender, non-distended, incision D/C/I Extremities: no edema Neurologic: normal gait    Postpartum Visit on 09/16/2020  Component Date Value Ref Range Status  . Color, UA 09/16/2020 yellow   Final  . Clarity, UA 09/16/2020 clear   Final  . Glucose, UA 09/16/2020 Negative  Negative Final  . Ketones, UA 09/16/2020 neg   Final  . Spec Grav, UA 09/16/2020 1.015  1.010 - 1.025 Final  . Blood, UA 09/16/2020 neg   Final  . pH, UA 09/16/2020 7.0  5.0 - 8.0 Final  . Nitrite, UA 09/16/2020 neg   Final  . Leukocytes, UA 09/16/2020 Negative  Negative Final  . Urine Culture, Routine 09/16/2020 Final report*  Final  . Organism ID, Bacteria 09/16/2020 Comment*  Final   Comment: Beta hemolytic Streptococcus, group B 25,000-50,000 colony forming units per mL Penicillin and ampicillin are drugs of choice for treatment of beta-hemolytic streptococcal infections. Susceptibility testing of penicillins and other beta-lactam agents approved by the FDA for treatment of beta-hemolytic streptococcal infections need not be performed routinely because nonsusceptible isolates are extremely rare in any beta-hemolytic streptococcus and have not been reported for Streptococcus pyogenes (group A). (CLSI)     Assessment: 30 y.o. s/p RLTCS  stable  Plan: Patient has done well after surgery with no apparent complications.  I have discussed the  post-operative course to date, and the expected progress moving forward.  The patient understands what complications to be concerned about.  I will see the patient in routine follow up, or sooner if needed.    Activity plan: No heavy lifting.  Breast feeding / POP   Vena Austria, MD, [facility], [facility] 09/23/2020, 3:26 PM

## 2020-09-26 ENCOUNTER — Telehealth: Payer: Self-pay

## 2020-09-26 NOTE — Telephone Encounter (Signed)
FMLA/DISABILITY form for [employer] filled out, signature obtained and given to [facility] for processing.

## 2020-10-01 ENCOUNTER — Telehealth: Payer: Self-pay

## 2020-10-01 NOTE — Telephone Encounter (Signed)
Katie from [facility] calling to confirm delivery on 4/9 and type.  [phone]  Bridgett Larsson delivery was actually on the 14th by c/s.

## 2020-10-29 ENCOUNTER — Encounter: Payer: Self-pay | Admitting: Obstetrics and Gynecology

## 2020-10-29 ENCOUNTER — Ambulatory Visit (INDEPENDENT_AMBULATORY_CARE_PROVIDER_SITE_OTHER): Payer: BLUE CROSS/BLUE SHIELD | Admitting: Obstetrics and Gynecology

## 2020-10-29 ENCOUNTER — Other Ambulatory Visit: Payer: Self-pay

## 2020-10-29 DIAGNOSIS — Z30011 Encounter for initial prescription of contraceptive pills: Secondary | ICD-10-CM

## 2020-10-29 MED ORDER — NORETHINDRONE 0.35 MG PO TABS: 1.0000 | ORAL_TABLET | Freq: Every day | ORAL | 11 refills | Status: DC

## 2020-10-29 NOTE — Progress Notes (Signed)
Postpartum Visit  Chief Complaint:  Chief Complaint  Patient presents with  . Post-op Follow-up    6 wk postpartum - discuss OCP, no concerns. RM 4    History of Present Illness: Patient is a 30 y.o. [id number] presents for postpartum visit.  Date of delivery: 09/12/2020 Cesarean Section: Elective repeat Pregnancy or labor problems:  no Any problems since the delivery:  no  Newborn Details:  SINGLETON :  1. BabyGender female. Birth weight: 8lbs 1oz Maternal Details:  Breast or formula feeding: plans to breastfeed Contraception after delivery: No  Any bowel or bladder issues: No  Post partum depression/anxiety noted:  no Edinburgh Post-Partum Depression Score:0 Date of last PAP: 01/20/2020 no abnormalities   Review of Systems: ROS  The following portions of the patient's history were reviewed and updated as appropriate: allergies, current medications, past family history, past medical history, past social history, past surgical history and problem list.  Past Medical History:  Past Medical History:  Diagnosis Date  . Elevated blood pressure reading   . Heart murmur   . VSD (ventricular septal defect)     Past Surgical History:  Past Surgical History:  Procedure Laterality Date  . CESAREAN SECTION N/A 02/27/2018   Procedure: CESAREAN SECTION;  Surgeon: Vena Austria, MD;  Location: [facility];  Service: Obstetrics;  Laterality: N/A;  . CESAREAN SECTION N/A 09/12/2020   Procedure: CESAREAN SECTION;  Surgeon: Vena Austria, MD;  Location: [facility];  Service: Obstetrics;  Laterality: N/A;  . MYRINGOTOMY     as an infant  . TIBIA FRACTURE SURGERY      Family History:  Family History  Problem Relation Age of Onset  . Ovarian cancer Other     Social History:  Social History   Socioeconomic History  . Marital status: Married    Spouse name: Madelaine Bhat  . Number of children: Not on file  . Years of education: 27  . Highest education level: Not on file   Occupational History  . Occupation: ACC COORDINATOR  Tobacco Use  . Smoking status: Never Smoker  . Smokeless tobacco: Never Used  Vaping Use  . Vaping Use: Never used  Substance and Sexual Activity  . Alcohol use: No  . Drug use: No  . Sexual activity: Yes    Birth control/protection: Pill  Other Topics Concern  . Not on file  Social History Narrative   Married.   Works as a Nurse, adult for [employer].    Graduate of [employer].   Enjoys reading exercise.    Social Determinants of Health   Financial Resource Strain: Not on file  Food Insecurity: Not on file  Transportation Needs: Not on file  Physical Activity: Not on file  Stress: Not on file  Social Connections: Not on file  Intimate Partner Violence: Not on file    Allergies:  No Known Allergies  Medications: Prior to Admission medications   Medication Sig Start Date End Date Taking? Authorizing Provider  ibuprofen (ADVIL) 800 MG tablet Take 1 tablet (800 mg total) by mouth every 8 (eight) hours. 09/14/20   Mirna Mires, CNM  oxyCODONE-acetaminophen (PERCOCET/ROXICET) 5-325 MG tablet Take 1-2 tablets by mouth every 4 (four) hours as needed for moderate pain or severe pain ((when tolerating fluids)). 09/14/20   Mirna Mires, CNM  Prenatal Vit-Fe Fumarate-FA (MULTIVITAMIN-PRENATAL) 27-0.8 MG TABS tablet Take 1 tablet by mouth daily.    [provider]    Physical Exam Blood pressure 110/60, height 5'  6" (1.676 m), weight 191 lb (86.6 kg), unknown if currently breastfeeding.  General: NAD HEENT: normocephalic, anicteric Pulmonary: No increased work of breathing Abdomen: NABS, soft, non-tender, non-distended.  Umbilicus without lesions.  No hepatomegaly, splenomegaly or masses palpable. No evidence of hernia. Incision D/C/I  External: Normal external female genitalia.  Normal urethral meatus, normal  Bartholin's and Skene's glands.    Vagina: Normal vaginal mucosa, no evidence of prolapse.     Cervix: Grossly normal in appearance, no bleeding  Uterus: Non-enlarged, mobile, normal contour.  No CMT  Adnexa: ovaries non-enlarged, no adnexal masses  Rectal: deferred Extremities: no edema, erythema, or tenderness Neurologic: Grossly intact Psychiatric: mood appropriate, affect full   Edinburgh Postnatal Depression Scale - 10/29/20 1012      Edinburgh Postnatal Depression Scale:  In the Past 7 Days   I have been able to laugh and see the funny side of things. 0    I have looked forward with enjoyment to things. 0    I have blamed myself unnecessarily when things went wrong. 0    I have been anxious or worried for no good reason. 0    I have felt scared or panicky for no good reason. 0    Things have been getting on top of me. 0    I have been so unhappy that I have had difficulty sleeping. 0    I have felt sad or miserable. 0    I have been so unhappy that I have been crying. 0    The thought of harming myself has occurred to me. 0    Edinburgh Postnatal Depression Scale Total 0           Assessment: 30 y.o. [id number] presenting for 6 week postpartum visit  Plan: Problem List Items Addressed This Visit   None   Visit Diagnoses    6 weeks postpartum follow-up    -  Primary   Initiation of oral contraception          1) Contraception - Education given regarding options for contraception, as well as compatibility with breast feeding if applicable.  Patient plans on oral progesterone-only contraceptive for contraception.  2)  Pap - ASCCP guidelines and rational discussed.  ASCCP guidelines and rational discussed.  Patient opts for every 3 years screening interval  3) Patient underwent screening for postpartum depression with no signs of depression  4) Return in about 1 year (around 10/29/2021) for annual.   Vena Austria, MD, [facility], [facility] 10/29/2020, 10:28 AM

## 2021-03-17 ENCOUNTER — Telehealth: Payer: Self-pay

## 2021-03-17 NOTE — Telephone Encounter (Signed)
Pt calling; is breastfeeding and has a cold - sore throat, body aches, H/A; what can she take?  Has been taking IBU.  [phone]  Pt also states has a cough and sinus congestion; negative covid test yesterday.  Adv for cough - plain robitussin, Hall's cough drops; sore throat - gargle with warm salt water gargles; nasal congestion - saline nasal spray; sinus congestion - plain sudafed; aches, pain, fever - e.s. tylenol two every 6 hours; may sip hot beverages/soup - help with congestion and sore throat; just watch caffeine intake as only allowed 16oz a day including chocolate; push fluids and rest.

## 2021-06-25 ENCOUNTER — Encounter: Payer: Self-pay | Admitting: Obstetrics & Gynecology

## 2021-10-22 ENCOUNTER — Telehealth: Payer: Self-pay | Admitting: Cardiovascular Disease

## 2021-10-22 NOTE — Telephone Encounter (Signed)
Patient declined recall states will fu prn as she has no insurance.   Deleting recall per patient request

## 2022-11-28 ENCOUNTER — Ambulatory Visit
Admission: EM | Admit: 2022-11-28 | Discharge: 2022-11-28 | Disposition: A | Discharge status: Home / Self Care | Payer: BLUE CROSS/BLUE SHIELD | Attending: Emergency Medicine | Admitting: Emergency Medicine

## 2022-11-28 DIAGNOSIS — J020 Streptococcal pharyngitis: Secondary | ICD-10-CM | POA: Insufficient documentation

## 2022-11-28 DIAGNOSIS — J069 Acute upper respiratory infection, unspecified: Secondary | ICD-10-CM | POA: Insufficient documentation

## 2022-11-28 LAB — GROUP A STREP BY PCR: Group A Strep by PCR: DETECTED — AB

## 2022-11-28 MED ORDER — CEFDINIR 300 MG PO CAPS: 300.0000 mg | ORAL_CAPSULE | Freq: Two times a day (BID) | ORAL | 0 refills | Status: AC

## 2022-11-28 MED ORDER — AMOXICILLIN-POT CLAVULANATE 875-125 MG PO TABS: 1.0000 | ORAL_TABLET | Freq: Two times a day (BID) | ORAL | 0 refills | Status: DC

## 2022-11-28 MED ORDER — IPRATROPIUM BROMIDE 0.06 % NA SOLN: 2.0000 | Freq: Four times a day (QID) | NASAL | 12 refills | Status: DC

## 2022-11-28 NOTE — ED Triage Notes (Signed)
Pt states that she has a sore throat, right ear pain,headache, body aches,chills,fever x 2 days. Taking dayquil and nyquil with no relief.

## 2022-11-28 NOTE — ED Provider Notes (Signed)
[facility]    CSN: [id number] Arrival date & time: 11/28/22  0802      History   Chief Complaint Chief Complaint  Patient presents with   Fever   Sore Throat   Otalgia    HPI Tiffany Conner is a 32 y.o. female.   HPI  32 year old female with a past medical history significant for ventricular septal defect, heart murmur, and psoriasis presents for evaluation of 2 days worth of fever with a Tmax of 100, runny nose, nasal congestion, nonproductive cough, chills, body ache, headache, sore throat, and right ear pain.  She denies shortness of breath or wheezing.  She denies known sick contacts.  Past Medical History:  Diagnosis Date   Elevated blood pressure reading    Heart murmur    VSD (ventricular septal defect)     Patient Active Problem List   Diagnosis Date Noted   Postpartum care following cesarean delivery 09/14/2020   Uterine scar from previous cesarean delivery affecting pregnancy 09/12/2020   History of cesarean delivery 08/02/2020   Supervision of other normal pregnancy, antepartum 01/30/2020   Hemorrhoids 09/25/2019   PSORIASIS 03/23/2008   VSD 03/23/2008    Past Surgical History:  Procedure Laterality Date   CESAREAN SECTION N/A 02/27/2018   Procedure: CESAREAN SECTION;  Surgeon: Vena Austria, MD;  Location: [facility];  Service: Obstetrics;  Laterality: N/A;   CESAREAN SECTION N/A 09/12/2020   Procedure: CESAREAN SECTION;  Surgeon: Vena Austria, MD;  Location: [facility];  Service: Obstetrics;  Laterality: N/A;   MYRINGOTOMY     as an infant   TIBIA FRACTURE SURGERY      OB History     Gravida  2   Para  2   Term  2   Preterm  0   AB  0   Living  2      SAB  0   IAB  0   Ectopic  0   Multiple  0   Live Births  2            Home Medications    Prior to Admission medications   Medication Sig Start Date End Date Taking? Authorizing Provider  cefdinir (OMNICEF) 300 MG capsule Take 1 capsule (300 mg  total) by mouth 2 (two) times daily for 10 days. 11/28/22 12/08/22 Yes Becky Augusta, NP  ipratropium (ATROVENT) 0.06 % nasal spray Place 2 sprays into both nostrils 4 (four) times daily. 11/28/22  Yes Becky Augusta, NP  ibuprofen (ADVIL) 800 MG tablet Take 1 tablet (800 mg total) by mouth every 8 (eight) hours. 09/14/20   Mirna Mires, CNM  oxyCODONE-acetaminophen (PERCOCET/ROXICET) 5-325 MG tablet Take 1-2 tablets by mouth every 4 (four) hours as needed for moderate pain or severe pain ((when tolerating fluids)). 09/14/20   Mirna Mires, CNM    Family History Family History  Problem Relation Age of Onset   Ovarian cancer Other     Social History Social History   Tobacco Use   Smoking status: Never   Smokeless tobacco: Never  Vaping Use   Vaping Use: Never used  Substance Use Topics   Alcohol use: No   Drug use: No     Allergies   Patient has no known allergies.   Review of Systems Review of Systems  Constitutional:  Positive for fever.  HENT:  Positive for congestion, ear pain, rhinorrhea and sore throat.   Respiratory:  Positive for cough. Negative for shortness of breath and  wheezing.        Mild nonproductive cough.     Physical Exam Triage Vital Signs ED Triage Vitals  Enc Vitals Group     BP 11/28/22 0821 121/79     Pulse Rate 11/28/22 0821 (!) 127     Resp 11/28/22 0821 (!) 21     Temp 11/28/22 0821 99.5 F (37.5 C)     Temp Source 11/28/22 0821 Oral     SpO2 11/28/22 0821 98 %     Weight 11/28/22 0821 170 lb (77.1 kg)     Height --      Head Circumference --      Peak Flow --      Pain Score 11/28/22 0820 8     Pain Loc --      Pain Edu? --      Excl. in GC? --    No data found.  Updated Vital Signs BP 121/79 (BP Location: Right Arm)   Pulse (!) 127   Temp 99.5 F (37.5 C) (Oral)   Resp (!) 21   Wt 170 lb (77.1 kg)   LMP 11/07/2022 (Approximate)   SpO2 98%   BMI 27.44 kg/m   Visual Acuity Right Eye Distance:   Left Eye Distance:    Bilateral Distance:    Right Eye Near:   Left Eye Near:    Bilateral Near:     Physical Exam Vitals and nursing note reviewed.  Constitutional:      Appearance: Normal appearance. She is not ill-appearing.  HENT:     Head: Normocephalic and atraumatic.     Right Ear: Tympanic membrane, ear canal and external ear normal. There is no impacted cerumen.     Left Ear: Tympanic membrane, ear canal and external ear normal. There is no impacted cerumen.     Nose: Nose normal. No congestion or rhinorrhea.     Mouth/Throat:     Mouth: Mucous membranes are moist.     Pharynx: Oropharyngeal exudate and posterior oropharyngeal erythema present.     Comments: Bilateral tonsillar pillars are 2+ edematous, erythematous, injected.  White exudate present on the left tonsillar pillar. Neck:     Comments: Bilateral anterior cervical lymphadenopathy that is tender on the right. Cardiovascular:     Rate and Rhythm: Normal rate and regular rhythm.     Pulses: Normal pulses.     Heart sounds: Normal heart sounds. No murmur heard.    No friction rub. No gallop.  Pulmonary:     Effort: Pulmonary effort is normal.     Breath sounds: Normal breath sounds. No wheezing, rhonchi or rales.  Musculoskeletal:     Cervical back: Normal range of motion and neck supple. Tenderness present.  Lymphadenopathy:     Cervical: Cervical adenopathy present.  Skin:    General: Skin is warm and dry.     Capillary Refill: Capillary refill takes less than 2 seconds.     Findings: No erythema.  Neurological:     General: No focal deficit present.     Mental Status: She is alert and oriented to person, place, and time.      UC Treatments / Results  Labs (all labs ordered are listed, but only abnormal results are displayed) Labs Reviewed  GROUP A STREP BY PCR - Abnormal; Notable for the following components:      Result Value   Group A Strep by PCR DETECTED (*)    All other components within normal limits  EKG   Radiology No results found.  Procedures Procedures (including critical care time)  Medications Ordered in UC Medications - No data to display  Initial Impression / Assessment and Plan / UC Course  I have reviewed the triage vital signs and the nursing notes.  Pertinent labs & imaging results that were available during my care of the patient were reviewed by me and considered in my medical decision making (see chart for details).   Patient is a nontoxic-appearing 32 year old female presenting for evaluation of respiratory symptoms as outlined in HPI above.  Her Prose predominant symptom is right ear pain and a sore throat.  She does have anterior cervical lymphadenopathy on exam which is more tender on the right.  I believe this was causing her ear pain as both ears are pearly gray in appearance with normal light reflex and no evidence of otitis media or externa.  Her nasal mucosa is also unremarkable and she has no tenderness to compression of bilateral frontal and maxillary sinuses.  Oropharyngeal exam does reveal erythema and injection of bilateral tonsillar pillars and soft palate.  There is also edema to bilateral tonsillar pillars with white exudate.  Cardiopulmonary exam is benign.  Strep PCR was collected at triage and is positive.  I initially called in Augmentin 875 for the patient and then she informed me after the prescription was sent that she and her husband are actively trying to conceive.  I called the pharmacy and canceled the Augmentin and I switched her to cefdinir 300 mg twice daily for 10 days for treatment of strep pharyngitis.  I have also prescribed Atrovent nasal spray to open the nasal congestion.  Salt water gargles, over-the-counter Tylenol, and Chloraseptic or Sucrets lozenges as needed for pain.  Return precautions reviewed.   Final Clinical Impressions(s) / UC Diagnoses   Final diagnoses:  Strep pharyngitis  Upper respiratory tract infection,  unspecified type     Discharge Instructions      Take the Cefdinir twice daily for 10 days for treatment of your strep throat.  Use the Atrovent nasal sprasy as needed for nasal congestion. 2 squirts in each nostril every 6 hours as needed.   Gargle with warm salt water 2-3 times a day to soothe your throat, aid in pain relief, and aid in healing.  Take over-the-counter ibuprofen according to the package instructions as needed for pain.  You can also use Chloraseptic or Sucrets lozenges, 1 lozenge every 2 hours as needed for throat pain.  If you develop any new or worsening symptoms return for reevaluation.      ED Prescriptions     Medication Sig Dispense Auth. Provider   amoxicillin-clavulanate (AUGMENTIN) 875-125 MG tablet  (Status: Discontinued) Take 1 tablet by mouth every 12 (twelve) hours for 10 days. 20 tablet Becky Augusta, NP   ipratropium (ATROVENT) 0.06 % nasal spray Place 2 sprays into both nostrils 4 (four) times daily. 15 mL Becky Augusta, NP   cefdinir (OMNICEF) 300 MG capsule Take 1 capsule (300 mg total) by mouth 2 (two) times daily for 10 days. 20 capsule Becky Augusta, NP      PDMP not reviewed this encounter.   Becky Augusta, NP 11/28/22 [phone]

## 2022-11-28 NOTE — Discharge Instructions (Addendum)
Take the Cefdinir twice daily for 10 days for treatment of your strep throat.  Use the Atrovent nasal sprasy as needed for nasal congestion. 2 squirts in each nostril every 6 hours as needed.   Gargle with warm salt water 2-3 times a day to soothe your throat, aid in pain relief, and aid in healing.  Take over-the-counter ibuprofen according to the package instructions as needed for pain.  You can also use Chloraseptic or Sucrets lozenges, 1 lozenge every 2 hours as needed for throat pain.  If you develop any new or worsening symptoms return for reevaluation.

## 2023-02-12 ENCOUNTER — Encounter: Payer: Self-pay | Admitting: Licensed Practical Nurse

## 2023-02-12 ENCOUNTER — Ambulatory Visit (INDEPENDENT_AMBULATORY_CARE_PROVIDER_SITE_OTHER): Payer: Self-pay | Admitting: Licensed Practical Nurse

## 2023-02-12 VITALS — BP 137/73 | HR 78 | Wt 187.8 lb

## 2023-02-12 DIAGNOSIS — Z3480 Encounter for supervision of other normal pregnancy, unspecified trimester: Secondary | ICD-10-CM | POA: Insufficient documentation

## 2023-02-12 DIAGNOSIS — Z113 Encounter for screening for infections with a predominantly sexual mode of transmission: Secondary | ICD-10-CM

## 2023-02-12 DIAGNOSIS — O0990 Supervision of high risk pregnancy, unspecified, unspecified trimester: Secondary | ICD-10-CM | POA: Insufficient documentation

## 2023-02-12 DIAGNOSIS — Z3A14 14 weeks gestation of pregnancy: Secondary | ICD-10-CM

## 2023-02-12 DIAGNOSIS — Z3482 Encounter for supervision of other normal pregnancy, second trimester: Secondary | ICD-10-CM

## 2023-02-12 DIAGNOSIS — Z0283 Encounter for blood-alcohol and blood-drug test: Secondary | ICD-10-CM

## 2023-02-12 LAB — POCT URINALYSIS DIPSTICK
Bilirubin, UA: NEGATIVE
Blood, UA: NEGATIVE
Glucose, UA: NEGATIVE
Ketones, UA: NEGATIVE
Leukocytes, UA: NEGATIVE
Nitrite, UA: NEGATIVE
Protein, UA: NEGATIVE
Spec Grav, UA: 1.02 (ref 1.010–1.025)
Urobilinogen, UA: 0.2 U/dL
pH, UA: 7 (ref 5.0–8.0)

## 2023-02-12 NOTE — Progress Notes (Signed)
New Obstetric Patient H&P    Chief Complaint: "Desires prenatal care" Ferrol started her care at [facility] because she was told they were supportive of VBAC after 2 c/s but they do not support this, so she is here. Her previous pregnancies were managed by [facility].   History of Present Illness: Patient is a 32 y.o. [id number] Not Hispanic or Latino female, presents with amenorrhea and positive home pregnancy test. Patient's last menstrual period was 11/06/2022. and based on her  LMP, her EDD is Estimated Date of Delivery: 08/13/23 and her EGA is [redacted]w[redacted]d. Cycles are  regular, and occur approximately every : 28 days. Her last pap smear was 3 years ago and was no abnormalities.    This was a planned pregnancy.  She an [place] on 01/05/23 that showed an SIUP at [phone]  Since her LMP she claims she has experienced nausea. She denies vaginal bleeding. Her past medical history is contibutory  (VSD). Her prior pregnancies are notable for  (c/s x 2, #1 at 37 wks, IOL for IUGR, c/s for NRFHT, #2 repeat c/s did not want to go through an induction, desires TOLAC, gained 30lbs in first pregnancy and 50 in second pregnancy, breastfed 12-16 months , has some baby blues with first child)  Since her LMP, she admits to the use of tobacco products  no She claims she has gained    unsure  pounds since the start of her pregnancy.  There are cats in the home in the home  no She admits close contact with children on a regular basis  yes  She has had chicken pox in the past yes She has had Tuberculosis exposures, symptoms, or previously tested positive for TB   no Current or past history of domestic violence. no  Genetic Screening/Teratology Counseling: (Includes patient, baby's father, or anyone in either family with:)   1. Patient's age >/= 52 at [facility]  no 2. Thalassemia ([place], [place], Mediterranean, or Asian background): MCV<80  no 3. Neural tube defect (meningomyelocele, spina bifida, anencephaly)  no 4. Congenital  heart defect  no  5. Down syndrome  no 6. Tay-Sachs (Jewish, [place]  no 7. Canavan's Disease  no 8. Sickle cell disease or trait (African)  no  9. Hemophilia or other blood disorders  no  10. Muscular dystrophy  no  11. Cystic fibrosis  no  12. Huntington's Chorea  no  13. Mental retardation/autism  no 14. Other inherited genetic or chromosomal disorder  no 15. Maternal metabolic disorder (DM, PKU, etc)  no 16. Patient or FOB with a child with a birth defect not listed above no  16a. Patient or FOB with a birth defect themselves no 17. Recurrent pregnancy loss, or stillbirth  no  18. Any medications since LMP other than prenatal vitamins (include vitamins, supplements, OTC meds, drugs, alcohol)  no 19. Any other genetic/environmental exposure to discuss  no  Infection History:   1. Lives with someone with TB or TB exposed  no  2. Patient or partner has history of genital herpes  no 3. Rash or viral illness since LMP  yes Strep throat  4. History of STI (GC, CT, HPV, syphilis, HIV)  no 5. History of recent travel :  no  Other pertinent information:  yes  SAHM Lives with her husband and children Walks for exercise   Indications for ASA therapy (per uptodate) One of the following: Previous pregnancy with preeclampsia, especially early onset and with an adverse outcome No Multifetal  gestation No Chronic hypertension No Type 1 or 2 diabetes mellitus No Chronic kidney disease No Autoimmune disease (antiphospholipid syndrome, systemic lupus erythematosus) No  Two or more of the following: Nulliparity No Obesity (body mass index >30 kg/m2) No Family history of preeclampsia in mother or sister No Age >=35 years No Sociodemographic characteristics (African American race, low socioeconomic level) No Personal risk factors (eg, previous pregnancy with low birth weight or small for gestational age infant, previous adverse pregnancy outcome [eg, stillbirth], interval >10  years between pregnancies) Yes   Review of Systems:10 point review of systems negative unless otherwise noted in HPI  Past Medical History:  Patient Active Problem List   Diagnosis Date Noted Date Diagnosed   Supervision of other normal pregnancy, antepartum 02/12/2023      Nursing Staff Provider  Office Location  AOB Dating  8wkUS  Language  English Anatomy US    Flu Vaccine  offer Genetic Screen  NIPS: declines  TDaP vaccine   offer Hgb A1C or  GTT Early : Third trimester :   Covid    LAB RESULTS   Rhogam  NA  Blood Type   A positive 12/18/22  Feeding Plan  Antibody  Negative   12/18/22  Contraception  Rubella  Immune  12/18/2022  Circumcision  RPR   Non reactive  12/18/2022  Pediatrician   HBsAg   Negative 12/18/2022  Support Person  HIV  Non reactive 12/18/2022  Prenatal Classes  Varicella Immune 7/192024    GBS  (For PCN allergy, check sensitivities)   BTL Consent     VBAC Consent  Pap      Hgb Electro    Pelvis Tested  CF      SMA            Uterine scar from previous cesarean delivery affecting pregnancy 09/12/2020    History of cesarean delivery 08/02/2020     OB/GYN  Counseling Note  32 y.o. G2P1001 at [redacted]w[redacted]d with Estimated Date of Delivery: 09/05/20 was seen today in office to discuss trial of labor after cesarean section (TOLAC) versus elective repeat cesarean delivery (ERCD). The following risks were discussed with the patient.  Risk of uterine rupture at term is 0.78 percent with TOLAC and 0.22 percent with ERCD. 1 in 10 uterine ruptures will result in neonatal death or neurological injury. The benefits of a trial of labor after cesarean (TOLAC) resulting in a vaginal birth after cesarean (VBAC) include the following: shorter length of hospital stay and postpartum recovery (in most cases); fewer complications, such as postpartum fever, wound or uterine infection, thromboembolism (blood clots in the leg or lung), need for blood transfusion and fewer neonatal breathing  problems.  The risks of an attempted VBAC or TOLAC include the following: Risk of failed trial of labor after cesarean (TOLAC) without a vaginal birth after cesarean (VBAC) resulting in repeat cesarean delivery (RCD) in about 20 to 40 percent of women who attempt VBAC.  Risk of rupture of uterus resulting in an emergency cesarean delivery. The risk of uterine rupture may be related in part to the type of uterine incision made during the first cesarean delivery. A previous transverse uterine incision has the lowest risk of rupture (0.2 to 1.5 percent risk). Vertical or T-shaped uterine incisions have a higher risk of uterine rupture (4 to 9 percent risk)The risk of fetal death is very low with both VBAC and elective repeat cesarean delivery (ERCD), but the likelihood of fetal death is higher with  VBAC than with ERCD. Maternal death is very rare with either type of delivery.  The risks of an elective repeat cesarean delivery (ERCD) were reviewed with the patient including but not limited to: 07/998 risk of uterine rupture which could have serious consequences, bleeding which may require transfusion; infection which may require antibiotics; injury to bowel, bladder or other surrounding organs (bowel, bladder, ureters); injury to the fetus; need for additional procedures including hysterectomy in the event of a life-threatening hemorrhage; thromboembolic phenomenon; abnormal placentation; incisional problems; death and other postoperative or anesthesia complications.    Desires VBAC    Hemorrhoids 09/25/2019    PSORIASIS 03/23/2008     Qualifier: Diagnosis of  By: Lindwood Qua CMA, Jerl Santos      VSD 03/23/2008     Qualifier: Diagnosis of  By: Lindwood Qua CMA, Jerl Santos       Past Surgical History:  Past Surgical History:  Procedure Laterality Date   CESAREAN SECTION N/A 02/27/2018   Procedure: CESAREAN SECTION;  Surgeon: Vena Austria, MD;  Location: [facility] ORS;  Service: Obstetrics;   Laterality: N/A;   CESAREAN SECTION N/A 09/12/2020   Procedure: CESAREAN SECTION;  Surgeon: Vena Austria, MD;  Location: [facility] ORS;  Service: Obstetrics;  Laterality: N/A;   MYRINGOTOMY     as an infant   TIBIA FRACTURE SURGERY      Gynecologic History: Patient's last menstrual period was 11/06/2022.  Obstetric History: [id number]  Family History:  Family History  Problem Relation Age of Onset   Ovarian cancer Other     Social History:  Social History   Socioeconomic History   Marital status: Married    Spouse name: Adam   Number of children: Not on file   Years of education: 16   Highest education level: Not on file  Occupational History   Occupation: ACC COORDINATOR  Tobacco Use   Smoking status: Never   Smokeless tobacco: Never  Vaping Use   Vaping status: Never Used  Substance and Sexual Activity   Alcohol use: No   Drug use: No   Sexual activity: Yes    Birth control/protection: Pill  Other Topics Concern   Not on file  Social History Narrative   Married.   Works as a Nurse, adult for [employer].    Graduate of [employer].   Enjoys reading exercise.    Social Determinants of Health   Financial Resource Strain: Not on file  Food Insecurity: Not on file  Transportation Needs: Not on file  Physical Activity: Not on file  Stress: Not on file  Social Connections: Not on file  Intimate Partner Violence: Not on file    Allergies:  No Known Allergies  Medications: Prior to Admission medications   Medication Sig Start Date End Date Taking? Authorizing Provider  Prenatal Vit-Fe Fumarate-FA (MULTIVITAMIN-PRENATAL) 27-0.8 MG TABS tablet Take 1 tablet by mouth daily at 12 noon.   Yes [provider]  ibuprofen (ADVIL) 800 MG tablet Take 1 tablet (800 mg total) by mouth every 8 (eight) hours. 09/14/20   Mirna Mires, CNM  ipratropium (ATROVENT) 0.06 % nasal spray Place 2 sprays into both nostrils 4 (four) times daily. Patient not taking: Reported  on 02/12/2023 11/28/22   Becky Augusta, NP  oxyCODONE-acetaminophen (PERCOCET/ROXICET) 5-325 MG tablet Take 1-2 tablets by mouth every 4 (four) hours as needed for moderate pain or severe pain ((when tolerating fluids)). 09/14/20   Mirna Mires, CNM    Physical Exam Vitals: Blood pressure 137/73, pulse 78, weight  187 lb 12.8 oz (85.2 kg), last menstrual period 11/06/2022, unknown if currently breastfeeding.  General: NAD HEENT: normocephalic, anicteric Thyroid: no enlargement, no palpable nodules Pulmonary: No increased work of breathing, CTAB Cardiovascular: RRR, distal pulses 2+ Abdomen: NABS, soft, non-tender, non-distended.  Umbilicus without lesions.  No hepatomegaly, splenomegaly or masses palpable. No evidence of hernia fetal heart tone 140  Genitourinary:  External:  Exam deferred  Vagina:   Cervix:   Uterus:   Adnexa:   Rectal: deferred Extremities: no edema, erythema, or tenderness Neurologic: Grossly intact Psychiatric: mood appropriate, affect full   Assessment: 32 y.o. G3P2002 at [redacted]w[redacted]d presenting to initiate prenatal care  Plan: 1) Avoid alcoholic beverages. 2) Patient encouraged not to smoke.  3) Discontinue the use of all non-medicinal drugs and chemicals.  4) Take prenatal vitamins daily.  5) Nutrition, food safety (fish, cheese advisories, and high nitrite foods) and exercise discussed. 6) Hospital and practice style discussed with cross coverage system.  7) Genetic Screening, such as with 1st Trimester Screening, cell free fetal DNA, AFP testing, and Ultrasound, as well as with amniocentesis and CVS as appropriate, is discussed with patient. At the conclusion of today's visit patient declined genetic testing 8) Patient is asked about travel to areas at risk for the [place] virus, and counseled to avoid travel and exposure to mosquitoes or sexual partners who may have themselves been exposed to the virus. Testing is discussed, and will be ordered as appropriate.    UDS/nicotine collected  Anatomy US ordered  RTC in 4 wks   Carie Caddy, CNM  [facility] 02/12/2023, 5:21 PM

## 2023-02-13 LAB — URINE DRUG PANEL 7
Amphetamines, Urine: NEGATIVE ng/mL
Barbiturate Quant, Ur: NEGATIVE ng/mL
Benzodiazepine Quant, Ur: NEGATIVE ng/mL
Cannabinoid Quant, Ur: NEGATIVE ng/mL
Cocaine (Metab.): NEGATIVE ng/mL
Opiate Quant, Ur: NEGATIVE ng/mL
PCP Quant, Ur: NEGATIVE ng/mL

## 2023-02-13 LAB — NICOTINE SCREEN, URINE: Cotinine Ql Scrn, Ur: NEGATIVE ng/mL

## 2023-03-12 ENCOUNTER — Encounter: Payer: Self-pay | Admitting: Licensed Practical Nurse

## 2023-03-16 ENCOUNTER — Ambulatory Visit
Admission: RE | Admit: 2023-03-16 | Discharge: 2023-03-16 | Disposition: A | Discharge status: Home / Self Care | Payer: Self-pay | Source: Ambulatory Visit | Attending: Licensed Practical Nurse | Admitting: Licensed Practical Nurse

## 2023-03-16 DIAGNOSIS — Z3480 Encounter for supervision of other normal pregnancy, unspecified trimester: Secondary | ICD-10-CM | POA: Insufficient documentation

## 2023-03-16 DIAGNOSIS — Z3A19 19 weeks gestation of pregnancy: Secondary | ICD-10-CM | POA: Insufficient documentation

## 2023-03-19 ENCOUNTER — Ambulatory Visit (INDEPENDENT_AMBULATORY_CARE_PROVIDER_SITE_OTHER): Payer: Self-pay | Admitting: Obstetrics and Gynecology

## 2023-03-19 ENCOUNTER — Encounter: Payer: Self-pay | Admitting: Obstetrics and Gynecology

## 2023-03-19 VITALS — BP 126/74 | HR 90 | Wt 193.0 lb

## 2023-03-19 DIAGNOSIS — R030 Elevated blood-pressure reading, without diagnosis of hypertension: Secondary | ICD-10-CM

## 2023-03-19 DIAGNOSIS — O34219 Maternal care for unspecified type scar from previous cesarean delivery: Secondary | ICD-10-CM | POA: Insufficient documentation

## 2023-03-19 DIAGNOSIS — O09299 Supervision of pregnancy with other poor reproductive or obstetric history, unspecified trimester: Secondary | ICD-10-CM | POA: Insufficient documentation

## 2023-03-19 DIAGNOSIS — Z3A19 19 weeks gestation of pregnancy: Secondary | ICD-10-CM

## 2023-03-19 DIAGNOSIS — O0990 Supervision of high risk pregnancy, unspecified, unspecified trimester: Secondary | ICD-10-CM

## 2023-03-19 LAB — POCT URINALYSIS DIPSTICK OB
Bilirubin, UA: NEGATIVE
Blood, UA: NEGATIVE
Glucose, UA: NEGATIVE
Ketones, UA: NEGATIVE
Leukocytes, UA: NEGATIVE
Nitrite, UA: NEGATIVE
POC,PROTEIN,UA: NEGATIVE
Spec Grav, UA: 1.01 (ref 1.010–1.025)
Urobilinogen, UA: 0.2 U/dL
pH, UA: 6.5 (ref 5.0–8.0)

## 2023-03-19 NOTE — Progress Notes (Signed)
ROB: Patient is a 32 y.o. G3P2002 at [redacted]w[redacted]d who presents for routine OB care.  Pregnancy is complicated by: has psoriasis; VSD; Hemorrhoids; History of 2 cesarean sections; Supervision of high risk pregnancy, antepartum; Desires VBAC (vaginal birth after cesarean) trial; and History of intrauterine growth restriction in prior pregnancy, currently pregnant, unspecified trimester on their problem list..    Patient has complaints of varicose veins.  Significant varicosities of the right leg noted.  Is attempting to elevate her legs in the evenings.  Discussed use of compression stockings during the day.  Can consider referral to vein specialist if this does not resolve after pregnancy.  Continues to decline genetic screening.  Declined flu vaccine today. Had anatomy scan earlier this week, results pending.  Initial BP elevated however repeat was normal.  Possible error in collecting first blood pressure based on significant discrepancy between systolic and diastolic.  Will continue to monitor during the pregnancy.  RTC in 4 weeks.

## 2023-03-25 ENCOUNTER — Other Ambulatory Visit: Payer: Self-pay | Admitting: Licensed Practical Nurse

## 2023-03-25 DIAGNOSIS — Z3480 Encounter for supervision of other normal pregnancy, unspecified trimester: Secondary | ICD-10-CM

## 2023-03-25 NOTE — Progress Notes (Signed)
Anatomy US rec follow up for outflow tract.  Called LVM and mychart message sent to pt to make her aware for repeat US Order placed Jannifer Hick   [facility]  03/25/23  1:08 PM

## 2023-04-08 ENCOUNTER — Ambulatory Visit (INDEPENDENT_AMBULATORY_CARE_PROVIDER_SITE_OTHER): Payer: Self-pay

## 2023-04-08 DIAGNOSIS — Z3482 Encounter for supervision of other normal pregnancy, second trimester: Secondary | ICD-10-CM

## 2023-04-08 DIAGNOSIS — Z3480 Encounter for supervision of other normal pregnancy, unspecified trimester: Secondary | ICD-10-CM

## 2023-04-08 DIAGNOSIS — Z3A21 21 weeks gestation of pregnancy: Secondary | ICD-10-CM

## 2023-04-16 ENCOUNTER — Ambulatory Visit (INDEPENDENT_AMBULATORY_CARE_PROVIDER_SITE_OTHER): Payer: Self-pay | Admitting: Obstetrics

## 2023-04-16 VITALS — BP 123/67 | HR 81 | Wt 198.0 lb

## 2023-04-16 DIAGNOSIS — O0990 Supervision of high risk pregnancy, unspecified, unspecified trimester: Secondary | ICD-10-CM

## 2023-04-16 DIAGNOSIS — Z130 Encounter for screening for diseases of the blood and blood-forming organs and certain disorders involving the immune mechanism: Secondary | ICD-10-CM

## 2023-04-16 DIAGNOSIS — Z113 Encounter for screening for infections with a predominantly sexual mode of transmission: Secondary | ICD-10-CM

## 2023-04-16 DIAGNOSIS — Z131 Encounter for screening for diabetes mellitus: Secondary | ICD-10-CM

## 2023-04-16 DIAGNOSIS — Z3A23 23 weeks gestation of pregnancy: Secondary | ICD-10-CM

## 2023-04-16 LAB — POCT URINALYSIS DIPSTICK OB
Bilirubin, UA: NEGATIVE
Blood, UA: NEGATIVE
Glucose, UA: NEGATIVE
Ketones, UA: NEGATIVE
Leukocytes, UA: NEGATIVE
Nitrite, UA: NEGATIVE
POC,PROTEIN,UA: NEGATIVE
Spec Grav, UA: 1.025 (ref 1.010–1.025)
Urobilinogen, UA: 0.2 U/dL
pH, UA: 5 (ref 5.0–8.0)

## 2023-04-16 NOTE — Progress Notes (Signed)
Routine Prenatal Care Visit  Subjective  Tiffany Conner is a 32 y.o. G3P2002 at [redacted]w[redacted]d being seen today for ongoing prenatal care.  She is currently monitored for the following issues for this low-risk pregnancy and has PSORIASIS; VSD; Hemorrhoids; History of 2 cesarean sections; Supervision of high risk pregnancy, antepartum; Desires VBAC (vaginal birth after cesarean) trial; and History of intrauterine growth restriction in prior pregnancy, currently pregnant, unspecified trimester on their problem list.  ----------------------------------------------------------------------------------- Patient reports no complaints.  Still committed to VBAC. Ample fetal movement. Thinks she is naming her Mia. She is reading [place] May's guide to Pregnancy and Birth. Contractions: Not present. Vag. Bleeding: None.  Movement: Present. Leaking Fluid denies.  ----------------------------------------------------------------------------------- The following portions of the patient's history were reviewed and updated as appropriate: allergies, current medications, past family history, past medical history, past social history, past surgical history and problem list. Problem list updated.  Objective  Blood pressure 123/67, pulse 81, weight 198 lb (89.8 kg), last menstrual period 11/06/2022, unknown if currently breastfeeding. Pregravid weight 165 lb (74.8 kg) Total Weight Gain 33 lb (15 kg) Urinalysis: Urine Protein    Urine Glucose    Fetal Status: Fetal Heart Rate (bpm): 147 Fundal Height: 24 cm Movement: Present     General:  Alert, oriented and cooperative. Patient is in no acute distress.  Skin: Skin is warm and dry. No rash noted.   Cardiovascular: Normal heart rate noted  Respiratory: Normal respiratory effort, no problems with respiration noted  Abdomen: Soft, gravid, appropriate for gestational age. Pain/Pressure: Absent     Pelvic:  Cervical exam deferred        Extremities: Normal range of motion.  Edema:  None  Mental Status: Normal mood and affect. Normal behavior. Normal judgment and thought content.   Assessment   32 y.o. G3P2002 at [redacted]w[redacted]d by  08/13/2023, by Last Menstrual Period presenting for routine prenatal visit  Plan   PREGNANCY 3 Problems (from 02/12/23 to present)     Problem Noted Resolved   Supervision of high risk pregnancy, antepartum 02/12/2023 by Ellwood Sayers, CNM No   Overview Addendum 03/19/2023  2:01 PM by Cornelius Moras, CMA     Nursing Staff Provider  Office Location  AOB Dating  8wkUS  Language  English Anatomy US    Flu Vaccine  declined Genetic Screen  NIPS: declines  TDaP vaccine   offer Hgb A1C or  GTT Early : Third trimester :   Covid no   LAB RESULTS   Rhogam  NA  Blood Type   A positive 12/18/22  Feeding Plan breast Antibody  Negative   12/18/22  Contraception none Rubella  Immune  12/18/2022  Circumcision girl RPR   Non reactive  12/18/2022  Pediatrician  Unsure  HBsAg   Negative 12/18/2022  Support Person husband HIV  Non reactive 12/18/2022  Prenatal Classes no Varicella Immune 7/192024    GBS  (For PCN allergy, check sensitivities)   BTL Consent     VBAC Consent  Pap      Hgb Electro    Pelvis Tested  CF      SMA                   Preterm labor symptoms and general obstetric precautions including but not limited to vaginal bleeding, contractions, leaking of fluid and fetal movement were reviewed in detail with the patient. Please refer to After Visit Summary for other counseling recommendations.  Spent time talking about preparing for  TOLAC. EPO mentioned with dates, other cervical ripeners  Return in about 4 weeks (around 05/14/2023) for return OB, 28 week labs.  Mirna Mires, CNM  04/16/2023 2:26 PM

## 2023-04-26 ENCOUNTER — Telehealth: Payer: Self-pay

## 2023-04-26 NOTE — Telephone Encounter (Signed)
Patient states she is [redacted] weeks pregnant and woke up with a cold sore this morning. Inquiring if Abreva is safe to use in pregnancy? Advised it is safe per Autumn Messing, CNM

## 2023-05-11 ENCOUNTER — Other Ambulatory Visit: Payer: Self-pay | Admitting: Certified Nurse Midwife

## 2023-05-14 ENCOUNTER — Other Ambulatory Visit: Payer: Self-pay

## 2023-05-14 ENCOUNTER — Ambulatory Visit (INDEPENDENT_AMBULATORY_CARE_PROVIDER_SITE_OTHER): Payer: Self-pay | Admitting: Obstetrics

## 2023-05-14 VITALS — BP 119/56 | HR 85 | Wt 201.0 lb

## 2023-05-14 DIAGNOSIS — Z130 Encounter for screening for diseases of the blood and blood-forming organs and certain disorders involving the immune mechanism: Secondary | ICD-10-CM

## 2023-05-14 DIAGNOSIS — Z3A23 23 weeks gestation of pregnancy: Secondary | ICD-10-CM

## 2023-05-14 DIAGNOSIS — Z113 Encounter for screening for infections with a predominantly sexual mode of transmission: Secondary | ICD-10-CM

## 2023-05-14 DIAGNOSIS — Z131 Encounter for screening for diabetes mellitus: Secondary | ICD-10-CM

## 2023-05-14 DIAGNOSIS — O0990 Supervision of high risk pregnancy, unspecified, unspecified trimester: Secondary | ICD-10-CM

## 2023-05-14 DIAGNOSIS — Z3A27 27 weeks gestation of pregnancy: Secondary | ICD-10-CM

## 2023-05-14 LAB — POCT URINALYSIS DIPSTICK OB
Bilirubin, UA: NEGATIVE
Blood, UA: NEGATIVE
Glucose, UA: NEGATIVE
Ketones, UA: NEGATIVE
Leukocytes, UA: NEGATIVE
Nitrite, UA: NEGATIVE
POC,PROTEIN,UA: NEGATIVE
Spec Grav, UA: 1.015 (ref 1.010–1.025)
Urobilinogen, UA: 0.2 U/dL
pH, UA: 6.5 (ref 5.0–8.0)

## 2023-05-14 NOTE — Progress Notes (Signed)
Routine Prenatal Care Visit  Subjective  Tiffany Conner is a 32 y.o. G3P2002 at [redacted]w[redacted]d being seen today for ongoing prenatal care.  She is currently monitored for the following issues for this low-risk pregnancy and has PSORIASIS; VSD; Hemorrhoids; History of 2 cesarean sections; Supervision of high risk pregnancy, antepartum; Desires VBAC (vaginal birth after cesarean) trial; and History of intrauterine growth restriction in prior pregnancy, currently pregnant, unspecified trimester on their problem list.  ----------------------------------------------------------------------------------- Patient reports no complaints.  She is having her 28 week labs today. She declines tdap- is not immunizing her children and doesn't want tdap.She is motivated to have a vaginal birth and plans a TOLAC Contractions: Not present. Vag. Bleeding: None.  Movement: Present. Leaking Fluid denies.  ----------------------------------------------------------------------------------- The following portions of the patient's history were reviewed and updated as appropriate: allergies, current medications, past family history, past medical history, past social history, past surgical history and problem list. Problem list updated.  Objective  Blood pressure (!) 119/56, pulse 85, weight 201 lb (91.2 kg), last menstrual period 11/06/2022, unknown if currently breastfeeding. Pregravid weight 165 lb (74.8 kg) Total Weight Gain 36 lb (16.3 kg) Urinalysis: Urine Protein Negative  Urine Glucose Negative  Fetal Status: Fetal Heart Rate (bpm): 140 Fundal Height: 28 cm Movement: Present     General:  Alert, oriented and cooperative. Patient is in no acute distress.  Skin: Skin is warm and dry. No rash noted.   Cardiovascular: Normal heart rate noted  Respiratory: Normal respiratory effort, no problems with respiration noted  Abdomen: Soft, gravid, appropriate for gestational age. Pain/Pressure: Absent     Pelvic:  Cervical exam  deferred        Extremities: Normal range of motion.  Edema: None  Mental Status: Normal mood and affect. Normal behavior. Normal judgment and thought content.   Assessment   32 y.o. Q6V7846 at [redacted]w[redacted]d by  08/13/2023, by Last Menstrual Period presenting for routine prenatal visit  Plan   PREGNANCY 3 Problems (from 02/12/23 to present)     Problem Noted Diagnosed Resolved   Supervision of high risk pregnancy, antepartum 02/12/2023 by Ellwood Sayers, CNM  No   Overview Addendum 05/14/2023 10:45 AM by Donnetta Hail, CMA   Nursing Staff Provider  Office Location  AOB Dating  8wkUS  Language  English Anatomy US    Flu Vaccine  Declined Genetic Screen  NIPS: declines  TDaP vaccine  Declined Hgb A1C or  GTT Early : Third trimester :   Covid no   LAB RESULTS   Rhogam  NA  Blood Type   A positive 12/18/22  Feeding Plan breast Antibody  Negative   12/18/22  Contraception none Rubella  Immune  12/18/2022  Circumcision girl RPR   Non reactive  12/18/2022  Pediatrician  Unsure  HBsAg   Negative 12/18/2022  Support Person husband HIV  Non reactive 12/18/2022  Prenatal Classes no Varicella Immune 7/192024    GBS  (For PCN allergy, check sensitivities)   BTL Consent     VBAC Consent  Pap      Hgb Electro    Pelvis Tested  CF      SMA                   Preterm labor symptoms and general obstetric precautions including but not limited to vaginal bleeding, contractions, leaking of fluid and fetal movement were reviewed in detail with the patient. Please refer to After Visit Summary for other counseling recommendations.  Return in about 2 weeks (around 05/28/2023) for return OB.  Mirna Mires, CNM  05/14/2023 11:12 AM

## 2023-05-15 LAB — 28 WEEK RH+PANEL
Basophils Absolute: 0 10*3/uL (ref 0.0–0.2)
Basos: 0 %
EOS (ABSOLUTE): 0.3 10*3/uL (ref 0.0–0.4)
Eos: 3 %
Gestational Diabetes Screen: 76 mg/dL (ref 70–139)
HIV Screen 4th Generation wRfx: NONREACTIVE
Hematocrit: 39 % (ref 34.0–46.6)
Hemoglobin: 13 g/dL (ref 11.1–15.9)
Immature Grans (Abs): 0.1 10*3/uL (ref 0.0–0.1)
Immature Granulocytes: 1 %
Lymphocytes Absolute: 2.1 10*3/uL (ref 0.7–3.1)
Lymphs: 24 %
MCH: 31.3 pg (ref 26.6–33.0)
MCHC: 33.3 g/dL (ref 31.5–35.7)
MCV: 94 fL (ref 79–97)
Monocytes Absolute: 0.6 10*3/uL (ref 0.1–0.9)
Monocytes: 7 %
Neutrophils Absolute: 5.6 10*3/uL (ref 1.4–7.0)
Neutrophils: 65 %
Platelets: 231 10*3/uL (ref 150–450)
RBC: 4.15 x10E6/uL (ref 3.77–5.28)
RDW: 11.9 % (ref 11.7–15.4)
RPR Ser Ql: NONREACTIVE
WBC: 8.7 10*3/uL (ref 3.4–10.8)

## 2023-05-28 ENCOUNTER — Ambulatory Visit (INDEPENDENT_AMBULATORY_CARE_PROVIDER_SITE_OTHER): Payer: Self-pay | Admitting: Certified Nurse Midwife

## 2023-05-28 VITALS — BP 115/67 | HR 89 | Wt 202.3 lb

## 2023-05-28 DIAGNOSIS — Z3A29 29 weeks gestation of pregnancy: Secondary | ICD-10-CM

## 2023-05-28 NOTE — Progress Notes (Signed)
ROB , feeling good movement. Denies contractions/braxton hicks. Discussed PTL precautions. Pt notes itchy Rash all over. PT showed me small area on her leg macular papule rash. Discussed rashes in pregnancy. Reviewed self help measures . Recommend benadryl /zyrtec for  itching(antihistamine). Calamine lotion, Aveeno oatmeal bath/lotion. She verbalizes understanding and agrees. Pt to follow up with MD for [facility] counseling next visit. Follow up 2 weeks.   Doreene Burke, CNM

## 2023-05-28 NOTE — Patient Instructions (Signed)
 Preterm Labor Pregnancy normally lasts 39-41 weeks. Preterm labor is when labor starts before you have been pregnant for 37 weeks. Babies who are born too early may have a higher risk for long-term problems like cerebral palsy or developmental delays. They may also have problems soon after birth, such as problems with blood sugar, body temperature, heart, and breathing. These problems may be very serious in babies who are born before 34 weeks of pregnancy. What are the causes? The cause of this condition is not known. What increases the risk? You are more likely to have preterm labor if: You have medical problems, now or in the past. You have problems now or in your past pregnancies. You have lifestyle problems. Medical history You have problems of the womb (uterus). You have an infection, including infections you get from sex. You have problems that do not go away, such as: Blood clots. High blood pressure. High blood sugar. You have low body weight or too much body weight. Present and past pregnancies You have had preterm labor before. You are pregnant with two babies or more. You have a condition in which the placenta covers your cervix. You waited less than 18 months between giving birth and becoming pregnant again. Your unborn baby has some problems. You have bleeding from your vagina. You became pregnant by a method called IVF. Lifestyle You smoke. You drink alcohol. You use drugs. You have stress. You have abuse in your home. You come in contact with chemicals that harm the body (pollutants). Other factors You are younger than 17 years or older than 35 years. What are the signs or symptoms? Symptoms of this condition include: Cramps. The cramps may feel like cramps from a period. You may also have watery poop (diarrhea). Pain in the belly (abdomen). Pain in the lower back. Regular contractions. It may feel like your belly is getting tighter. Pressure in the lower  belly. More fluid leaking from the vagina. The fluid may be watery or bloody. Water breaking. How is this treated? Treatment for this condition depends on your health, the health of your baby, and how old your pregnancy is. It may include: Taking medicines, such as: Hormone medicines. Medicines to stop contractions. Medicines to help mature the baby's lungs. Medicines to prevent your baby from getting cerebral palsy or other problems. Bed rest. If the labor happens before 34 weeks of pregnancy, you may need to stay in the hospital. Delivering the baby. Follow these instructions at home:  Do not smoke or use any products that contain nicotine or tobacco. If you need help quitting, ask your doctor. Do not drink alcohol. Take over-the-counter and prescription medicines only as told by your doctor. Rest as told by your doctor. Return to your normal activities when your doctor says that it is safe. Keep all follow-up visits. How is this prevented? To have a healthy pregnancy: Do not use drugs. Do not use any medicines unless you ask your doctor if they are safe for you. Talk with your doctor before taking any herbal supplements. Make sure you gain enough weight. Watch for infection. If you think you might have an infection, get it checked right away. Symptoms of infection may include: Fever. Vaginal discharge that smells bad or is not normal. Pain or burning when you pee. Needing to pee urgently. Needing to pee often. Peeing small amounts often. Blood in your pee. Pee that smells bad or unusual. Where to find more information U.S. Department of Health and [employer]  Office on [employer] Health: http://hoffman.com/ [employer] of [employer]stetricians and Gynecologists: www.acog.org Centers for Disease Control and Prevention: FootballExhibition.com.br Contact a doctor if: You think you are going into preterm labor. You have symptoms of preterm labor. You have symptoms of infection. Get help  right away if: You are having painful contractions every 5 minutes or less. Your water breaks. Summary Preterm labor is labor that starts before you reach 37 weeks of pregnancy. Your baby may have problems if delivered early. You are more likely to have preterm labor if you have certain medical problems or problems with a pregnancy now or in the past. Some lifestyle factors can also increase the risk. Contact a doctor if you have symptoms of preterm labor. This information is not intended to replace advice given to you by your health care provider. Make sure you discuss any questions you have with your health care provider. Document Revised: 05/17/2020 Document Reviewed: 05/21/2020 Elsevier Patient Education  2024 [employer].

## 2023-06-02 NOTE — L&D Delivery Note (Addendum)
 Delivery Note   Tiffany Conner is a 33 y.o. G3P2002 at [redacted]w[redacted]d Estimated Date of Delivery: 08/13/23  PRE-OPERATIVE DIAGNOSIS:  1) [redacted]w[redacted]d pregnancy.  2) History of 2 previous cesarean births  POST-OPERATIVE DIAGNOSIS:  1) [redacted]w[redacted]d pregnancy s/p VBAC, Spontaneous 2) PPH of 1000 ml   Delivery Type: VBAC, Spontaneous   Delivery Anesthesia: Epidural  Labor Complications:  none     ESTIMATED BLOOD LOSS: 1000 ml    FINDINGS:   1) female infant, Apgar scores of 7   at 1 minute and 9   at 5 minutes and a birthweight of 112.52 ounces.     SPECIMENS:   PLACENTA:   Appearance: Intact   Removal: Spontaneous     Disposition:  discarded   CORD BLOOD: discarded  DISPOSITION:  Infant left in stable condition in the delivery room, with L&D personnel and mother,  NARRATIVE SUMMARY: Labor course:  Tiffany Conner is a [id number] at [redacted]w[redacted]d who presented to Labor & Delivery for labor management. Her initial cervical exam was 1/70/-2. Labor proceeded (spontaneously/with augmentation) and she was found to be completely dilated at 1638. With excellent maternal pushing effort, she birthed a viable female infant at 49. There was not a nuchal cord. The shoulders were birthed without difficulty. The infant was placed skin-to-skin with Tiffany Conner. The placenta delivered spontaneously and was noted to be intact with a 3VC. The cord was doubly clamped and cut by Tiffany Conner, Tiffany Conner when pulsations ceased.  A perineal and vaginal examination was performed. Tiffany Conner was called to the bedside to evaluate brisk bleeding coming from a laceration that was difficult to visualize on the right vaginal sidewall. While awaiting Dr. Oretha Milch arrival, a 2nd degree was repaired by Autumn Messing, CNM. IV pitocin and TXA was given. See Dr. Oretha Milch note for details on vaginal wall repair.  Episiotomy/Lacerations: Vaginal;Sulcus;Perineal Lacerations were repaired with Vicryl suture using epidural and local anesthesia. The  patient tolerated this well. Mother and baby were left in stable condition.  Tiffany Conner aware of delivery and patient status.   Eloise Levels, Student-MidWife 08/26/2023 6:37 PM  Autumn Messing, CNM was present for all portions of care.

## 2023-06-10 ENCOUNTER — Ambulatory Visit (INDEPENDENT_AMBULATORY_CARE_PROVIDER_SITE_OTHER): Payer: Self-pay | Admitting: Obstetrics and Gynecology

## 2023-06-10 VITALS — BP 115/55 | HR 83 | Wt 207.7 lb

## 2023-06-10 DIAGNOSIS — O0990 Supervision of high risk pregnancy, unspecified, unspecified trimester: Secondary | ICD-10-CM

## 2023-06-10 LAB — POCT URINALYSIS DIPSTICK OB
Appearance: NORMAL
Bilirubin, UA: NEGATIVE
Blood, UA: NEGATIVE
Glucose, UA: NEGATIVE
Ketones, UA: NEGATIVE
Leukocytes, UA: NEGATIVE
Nitrite, UA: NEGATIVE
Odor: NORMAL
Spec Grav, UA: 1.015 (ref 1.010–1.025)
Urobilinogen, UA: NEGATIVE U/dL — AB
pH, UA: 6 (ref 5.0–8.0)

## 2023-06-10 NOTE — Progress Notes (Signed)
 ROB: Patient is a 33 y.o. G3P2002 at [redacted]w[redacted]d who presents for routine OB care.  Pregnancy is complicated by  has PSORIASIS; VSD; Hemorrhoids; History of 2 cesarean sections; Supervision of high risk pregnancy, antepartum; Desires VBAC (vaginal birth after cesarean) trial; and History of intrauterine growth restriction in prior pregnancy, currently pregnant, third trimester on their problem list..   Patient denies complaints. Notes good fetal movement.  Also presents for [facility] counseling.  Patient has a history of C-section x 2 (initial for NRFHT after IOL for IUGR at 37 weeks, second was elective due to postdates).  Desires trial of labor with this pregnancy.  Notes that she was scared to attempt a trial of labor with her second pregnancy due to having a bad experience with induction of her first child.  Is now ready to attempt labor. Discussed methods of natural cervical ripening.   TOLAC Counseling Note Discussed risks vs benefits of TOLAC vs repeat C-section.  Risk of uterine rupture at term is ~ 1%.  Risk of failed trial of labor after cesarean (TOLAC) without a vaginal birth after cesarean (VBAC) resulting in repeat cesarean delivery (RCD) in about 20 to 40 percent of women who attempt VBAC.  Risk of requiring emergency C-section due to uterine rupture discussed.  Increased risk of rupture if uterotonic agents are necessary for augmentation. The benefits of a trial of labor after cesarean (TOLAC) resulting in a vaginal birth after cesarean (VBAC) include the following: shorter length of hospital stay and postpartum recovery (in most cases); fewer complications, such as postpartum fever, wound or uterine infection, thromboembolism (blood clots in the leg or lung), need for blood transfusion and fewer neonatal breathing problems.  Patient notes understanding.  Still desires TOLAC.  Patient ok to proceed. Consent form signed today.

## 2023-06-10 NOTE — Patient Instructions (Signed)
 Natural methods to encourage timely labor   Your EDD (estimated due date) is just that... an estimate.  Your baby may come earlier or later, there are a lot of hormones and feedback loops to cause labor to occur and to be honest, no one actually knows the EXACT reason why this occurs.     Full term is birth after 37 weeks, while post-term is birth after 41 weeks.  We will guide you in this process of determining labor and delivery planning, but we hope that you will go into spontaneous labor between these 4 weeks.  Review the information in your Midwifery handout for our induction of labor detail, statistics, and processes as when your pregnancy ages, so does your placenta and this can lead to poorer placental.    The best natural method to consider when encouraging labor is time and patience.  This information below is to discuss ways that your body can prepare and be ready for labor and encourage this to happen on its own without a medical induction at the hospital.  Many methods do not have a ton of evidence based research behind them, but do come with minimal risk and potential good benefits  of helping your body prepare for labor. The information on herbs has been gathered over hundreds of years and has little scientific research, but has been tested by time and experience.       At 36 weeks: EVENING PRIMROSE OIL: -- These are available in capsules (500-1000mg ) at whole foods, [employer], [place], etc.  -- May take 1-3 capsules a day.  The capsules can be taken orally and swallowed, or inserted vaginally at night.  They will leak out overnight, so wear a pad or a liner to avoid oil on your sheets.  -- May take one capsule orally and insert one vaginally at night until delivery.  May increase to 2 vaginally at night from 38 weeks to delivery.  -- These contain GLA (an Omega-6 fatty acid) and are a precursor to labor hormones and have been shown to get the body ready for labor.  They may soften  the cervix, but will not cause you to be induced, so take without worry.   -- If you have had a vaginal delivery in the past, likely your cervix is already ripe and you do not need to take these. Discuss with your midwife.    DATES: -- Eat 6 dates a day from 36 weeks until delivery. If you have gestational diabetes, please discuss this with us , as you should probably not use this with the high sugar content in dates.   -- One study showed a reduced need for inducing and augmenting labor and a more favorable delivery outcome, but this was not statistically significant in the study.    RED RASPBERRY LEAF TEA -- Drink 1-2 cups a day until labor begins to help tone the uterus and organize contractions -- Make a strong tea using 1/4 oz dried herb to 1 pint of water and steep for 20 minutes (or use some that is commercially available).  -- Try to do this early in the day as contractions overnight can disrupt a good nights sleep!      At 36 weeks:  EVENING PRIMROSE OIL --Take 1000 mg  twice daily - by mouth in the morning and vaginally at bedtime. (If previous C-section, take both doses by mouth)   At 38-39 weeks:  BLUE COHOSH 1 capsule daily starting at [redacted] weeks gestation  2 capsules daily starting at [redacted] weeks gestation 3 capsules daily starting at [redacted] weeks gestation   OTHER SUGGESTIONS: ACUPUNCTURE -- can be done weekly starting at 36 weeks by a trained acupuncturist.  There are many in the area that perform this using fine needles at certain points on the body to help ripen the cervix and induce labor.  -- if this is performed close to the due date, it has been shown to have a success rate of up to 88% in starting labor.   SPINNING BABIES and the MILES CIRCUIT (to encourage optimal positioning)  www.spinningbabies.com & https://glass.com/.com    BELLY BINDING --Poor abdominal support can lead to malpositioned babies and therefore prodromal labor.  Support good abdominal tone and relieve  back pain by wearing a tummy support whether with a belly binder, using a sheet to wrap around your hips, or a rebozo.  It will lift up your baby and place them more centrally in your pelvis to encourage pressure on your cervix.     EXERCISE -- Walking, belly dancing, swimming, bouncing on the birthing ball and some yoga movements can help the baby to descend into the birth canal and apply pressure on the cervix. Talk to us  about which position may be most effective for you and your babys current position.     FOODS -- pineapple: rich in bromelain, an enzyme that can encourage the ripening of the cervix and simulate prostaglandin production             * eat this fresh daily, as canning and juicing destroys the bromelain -- black licorice candy (real): also known to stimulate prostaglandin production due to glycyrrhizin but can also cause mild diarrhea that may lead to sympthatetic uterine contractions, not much research here on this.    HERBS -- Basil, oregano, and thyme can help to coordinate your contractions if they are irregular and help to soften your cervix.  Make an eggplant parmesan or similar that uses these herbs or make a tea with one teaspoon of each and steep for 5 minutes.  Strain the leaves, add honey to sweeten it and sip the tea to welcome labor.    MASSAGE -- Prenatal massage increases blood circulation which can increase the circulation to the placenta and therefore the baby.  It also stimulates the lymph system to increase immunity and release toxins.  Massage can also increase endorphins into the mothers body to prepare her for an easier delivery with the sedating effects on the nervous system and promoting stress relief and relaxation.    RELAXATION/VISUALIZATION --The psyche is one of the 5 P's of labor (others are the passenger, passage, powers, and placenta). Fear and anxiety can stimmulate catecholamine release which can counteract the flow of hormones necessary in  promoting labor.  -- Choose your birth team and environment carefully so that you can feel relaxed and comfortable in the place of labor and birth.  Everyone should believe in you and your power to birth your baby.   -- Some choose to bring lights, battery candles, birth cards (that have photos or phrases of encouragement), essential oil diffusers, etc. to the labor room to help them stay relaxed in a new environment.

## 2023-06-10 NOTE — Progress Notes (Signed)
 ROB [redacted]w[redacted]d: Patient reports good fetal movement, denies contractions. She reports mild swelling of feet/ankles.

## 2023-06-11 ENCOUNTER — Encounter: Payer: Self-pay | Admitting: Certified Nurse Midwife

## 2023-06-18 ENCOUNTER — Telehealth: Payer: Self-pay

## 2023-06-18 NOTE — Telephone Encounter (Signed)
Pt dentist called requesting a dental form faxed. I faxed to [phone]

## 2023-06-25 ENCOUNTER — Ambulatory Visit (INDEPENDENT_AMBULATORY_CARE_PROVIDER_SITE_OTHER): Payer: Self-pay

## 2023-06-25 VITALS — BP 130/69 | HR 85 | Wt 207.0 lb

## 2023-06-25 DIAGNOSIS — K649 Unspecified hemorrhoids: Secondary | ICD-10-CM

## 2023-06-25 DIAGNOSIS — O0990 Supervision of high risk pregnancy, unspecified, unspecified trimester: Secondary | ICD-10-CM

## 2023-06-25 DIAGNOSIS — O09299 Supervision of pregnancy with other poor reproductive or obstetric history, unspecified trimester: Secondary | ICD-10-CM

## 2023-06-25 DIAGNOSIS — O34219 Maternal care for unspecified type scar from previous cesarean delivery: Secondary | ICD-10-CM

## 2023-06-25 DIAGNOSIS — L408 Other psoriasis: Secondary | ICD-10-CM

## 2023-06-25 DIAGNOSIS — Z98891 History of uterine scar from previous surgery: Secondary | ICD-10-CM

## 2023-06-25 NOTE — Assessment & Plan Note (Signed)
-   Reviewed kick counts and preterm labor warning signs. Instructed to call office or come to hospital with persistent headache, vision changes, regular contractions, leaking of fluid, decreased fetal movement or vaginal bleeding.

## 2023-06-25 NOTE — Progress Notes (Signed)
    Return Prenatal Note   Assessment/Plan   Plan  33 y.o. G3P2002 at [redacted]w[redacted]d presents for follow-up OB visit. Reviewed prenatal record including previous visit note.  History of 2 cesarean sections - TOLAC counseling completed on 1/9 with Dr. Valentino Conner.  Supervision of high risk pregnancy, antepartum - Reviewed kick counts and preterm labor warning signs. Instructed to call office or come to hospital with persistent headache, vision changes, regular contractions, leaking of fluid, decreased fetal movement or vaginal bleeding.   History of intrauterine growth restriction in prior pregnancy, currently pregnant, unspecified trimester - Feels that this baby is similar in size to her last baby that was about 8 lbs, measuring S=D. Discussed option for growth [place] but feels this is not needed at this time.    No orders of the defined types were placed in this encounter.  Return in about 2 weeks (around 07/09/2023) for ROB.   Future Appointments  Date Time Provider Department Center  07/09/2023  1:15 PM Tiffany Conner, CNM [facility] None    For next visit:  continue with routine prenatal care     Subjective   33 y.o. Z6X0960 at [redacted]w[redacted]d presents for this follow-up prenatal visit.  Patient has no concerns, feeling good overall.  Patient reports: Movement: Present Contractions: Not present  Objective   Flow sheet Vitals: Pulse Rate: 85 BP: 130/69 Fundal Height: 33 cm Fetal Heart Rate (bpm): 145 Presentation: Vertex Total weight gain: 42 lb (19.1 kg)  General Appearance  No acute distress, well appearing, and well nourished Pulmonary   Normal work of breathing Neurologic   Alert and oriented to person, place, and time Psychiatric   Mood and affect within normal limits  Tiffany Conner, CNM  01/24/252:50 PM

## 2023-06-25 NOTE — Assessment & Plan Note (Signed)
-   TOLAC counseling completed on 1/9 with Dr. Valentino Saxon.

## 2023-06-25 NOTE — Assessment & Plan Note (Signed)
-   Feels that this baby is similar in size to her last baby that was about 8 lbs, measuring S=D. Discussed option for growth [place] but feels this is not needed at this time.

## 2023-07-09 ENCOUNTER — Encounter: Payer: Self-pay | Admitting: Obstetrics

## 2023-07-12 ENCOUNTER — Other Ambulatory Visit (HOSPITAL_COMMUNITY)
Admission: RE | Admit: 2023-07-12 | Discharge: 2023-07-12 | Disposition: A | Discharge status: Home / Self Care | Payer: Self-pay | Source: Ambulatory Visit | Attending: Certified Nurse Midwife | Admitting: Certified Nurse Midwife

## 2023-07-12 ENCOUNTER — Ambulatory Visit (INDEPENDENT_AMBULATORY_CARE_PROVIDER_SITE_OTHER): Payer: Self-pay | Admitting: Certified Nurse Midwife

## 2023-07-12 ENCOUNTER — Encounter: Payer: Self-pay | Admitting: Certified Nurse Midwife

## 2023-07-12 VITALS — BP 114/58 | HR 84 | Wt 207.9 lb

## 2023-07-12 DIAGNOSIS — O0990 Supervision of high risk pregnancy, unspecified, unspecified trimester: Secondary | ICD-10-CM | POA: Insufficient documentation

## 2023-07-12 DIAGNOSIS — Z3685 Encounter for antenatal screening for Streptococcus B: Secondary | ICD-10-CM

## 2023-07-12 DIAGNOSIS — Z113 Encounter for screening for infections with a predominantly sexual mode of transmission: Secondary | ICD-10-CM | POA: Insufficient documentation

## 2023-07-12 DIAGNOSIS — O34219 Maternal care for unspecified type scar from previous cesarean delivery: Secondary | ICD-10-CM

## 2023-07-12 NOTE — Patient Instructions (Signed)
 Third Trimester of Pregnancy  The third trimester of pregnancy is from week 28 through week 40. This is months 7 through 9. The third trimester is a time when your baby is growing fast. Body changes during your third trimester Your body continues to change during this time. The changes usually go away after your baby is born. Physical changes You will continue to gain weight. You may get stretch marks on your hips, belly, and breasts. Your breasts will keep growing and may hurt. A yellow fluid (colostrum) may leak from your breasts. This is the first milk you're making for your baby. Your hair may grow faster and get thicker. In some cases, you may get hair loss. Your belly button may stick out. You may have more swelling in your hands, face, or ankles. Health changes You may have heartburn. You may feel short of breath. This is caused by the uterus that is now bigger. You may have more aches in the pelvis, back, or thighs. You may have more tingling or numbness in your hands, arms, and legs. You may pee more often. You may have trouble pooping (constipation) or swollen veins in the butt that can itch or get painful (hemorrhoids). Other changes You may have more problems sleeping. You may notice the baby moving lower in your belly (dropping). You may have more fluid coming from your vagina. Your joints may feel loose, and you may have pain around your pelvic bone. Follow these instructions at home: Medicines Take medicines only as told by your health care provider. Some medicines are not safe during pregnancy. Your provider may change the medicines that you take. Do not take any medicines unless told to by your provider. Take a prenatal vitamin that has at least 600 micrograms (mcg) of folic acid. Do not use herbal medicines, illegal drugs, or medicines that are not approved by your provider. Eating and drinking While you're pregnant your body needs additional nutrition to help  support your growing baby. Talk with your provider about your nutritional needs. Activity Most women are able to exercise regularly during pregnancy. Exercise routines may need to change at the end of your pregnancy. Talk to your provider about your activities and exercise routine. Relieving pain and discomfort Rest often with your legs raised if you have leg cramps or low back pain. Take warm sitz baths to soothe pain from hemorrhoids. Use hemorrhoid cream if your provider says it's okay. Wear a good, supportive bra if your breasts hurt. Do not use hot tubs, steam rooms, or saunas. Do not douche. Do not use tampons or scented pads. Safety Talk to your provider before traveling far distances. Wear your seatbelt at all times when you're in a car. Talk to your provider if someone hits you, hurts you, or yells at you. Preparing for birth To prepare for your baby: Take childbirth and breastfeeding classes. Visit the hospital and tour the maternity area. Buy a rear-facing car seat. Learn how to install it in your car. General instructions Avoid cat litter boxes and soil used by cats. These things carry germs that can cause harm to your pregnancy and your baby. Do not drink alcohol, smoke, vape, or use products with nicotine or tobacco in them. If you need help quitting, talk with your provider. Keep all follow-up visits for your third trimester. Your provider will do more exams and tests during this trimester. Write down your questions. Take them to your prenatal visits. Your provider also will: Talk with you about  your overall health. Give you advice or refer you to specialists who can help with different needs, including: Mental health and counseling. Foods and healthy eating. Ask for help if you need help with food. Where to find more information American Pregnancy Association: americanpregnancy.org [employer] of [employer]ians and Gynecologists: acog.org Office on [employer] Health:  TravelLesson.ca Contact a health care provider if: You have a headache that does not go away when you take medicine. You have any of these problems: You can't eat or drink. You have nausea and vomiting. You have watery poop (diarrhea) for 2 days or more. You have pain when you pee, or your pee smells bad. You have been sick for 2 days or more and aren't getting better. Contact your provider right away if: You have any of these coming from your vagina: Abnormal discharge. Bad-smelling fluid. Bleeding. Your baby is moving less than usual. You have signs of labor: You have any contractions, belly cramping, or have pain in your pelvis or lower back before 37 weeks of pregnancy (preterm labor). You have regular contractions that are less than 5 minutes apart. Your water breaks. You have symptoms of high blood pressure or preeclampsia. These include: A severe, throbbing headache that does not go away. Sudden or extreme swelling of your face, hands, legs, or feet. Vision problems: You see spots. You have blurry vision. Your eyes are sensitive to light. If you can't reach your provider, go to an urgent care or emergency room. Get help right away if: You faint, become confused, or can't think clearly. You have chest pain or trouble breathing. You have any kind of injury, such as from a fall or a car crash. These symptoms may be an emergency. Call 911 right away. Do not wait to see if the symptoms will go away. Do not drive yourself to the hospital. This information is not intended to replace advice given to you by your health care provider. Make sure you discuss any questions you have with your health care provider. Document Revised: 02/18/2023 Document Reviewed: 09/18/2022 Elsevier Patient Education  2024 [employer].

## 2023-07-12 NOTE — Assessment & Plan Note (Signed)
 Reviewed kick counts and preterm labor warning signs. Instructed to call office or come to hospital with persistent headache, vision changes, regular contractions, leaking of fluid, decreased fetal movement or vaginal bleeding. Encouraging labor reviewed, information sent via MyChart

## 2023-07-12 NOTE — Progress Notes (Signed)
    Return Prenatal Note   Subjective   33 y.o. [id number] at [redacted]w[redacted]d presents for this follow-up prenatal visit.  Patient feeling well, active baby. Planning to labor at home as long as possible. Reviewed recommendation for CEFM and use of wireless monitors in labor. Patient reports: Movement: Present Contractions: Not present  Objective   Flow sheet Vitals: Pulse Rate: 84 BP: (!) 114/58 Fundal Height: 36 cm Fetal Heart Rate (bpm): 135 Presentation: Vertex ([place]) Total weight gain: 42 lb 14.4 oz (19.5 kg)  General Appearance  No acute distress, well appearing, and well nourished Pulmonary   Normal work of breathing Neurologic   Alert and oriented to person, place, and time Psychiatric   Mood and affect within normal limits  Assessment/Plan   Plan  33 y.o. [id number] at [redacted]w[redacted]d presents for follow-up OB visit. Reviewed prenatal record including previous visit note.  Supervision of high risk pregnancy, antepartum Reviewed kick counts and preterm labor warning signs. Instructed to call office or come to hospital with persistent headache, vision changes, regular contractions, leaking of fluid, decreased fetal movement or vaginal bleeding. Encouraging labor reviewed, information sent via MyChart      Orders Placed This Encounter  Procedures   Culture, beta strep (group b only)   No follow-ups on file.   Future Appointments  Date Time Provider Department Center  07/20/2023  3:15 PM Slaughterbeck, Sherline Distel, CNM [facility] None    For next visit:  continue with routine prenatal care     Forestine Igo, CNM  02/10/254:47 PM

## 2023-07-14 LAB — CERVICOVAGINAL ANCILLARY ONLY
Chlamydia: NEGATIVE
Comment: NEGATIVE
Comment: NORMAL
Neisseria Gonorrhea: NEGATIVE

## 2023-07-15 LAB — CULTURE, BETA STREP (GROUP B ONLY): Strep Gp B Culture: POSITIVE — AB

## 2023-07-16 ENCOUNTER — Encounter: Payer: Self-pay | Admitting: Certified Nurse Midwife

## 2023-07-20 ENCOUNTER — Encounter: Payer: Self-pay | Admitting: Certified Nurse Midwife

## 2023-07-23 ENCOUNTER — Encounter: Payer: Self-pay | Admitting: Certified Nurse Midwife

## 2023-07-23 ENCOUNTER — Ambulatory Visit (INDEPENDENT_AMBULATORY_CARE_PROVIDER_SITE_OTHER): Payer: Self-pay | Admitting: Certified Nurse Midwife

## 2023-07-23 VITALS — BP 123/71 | HR 79 | Wt 209.7 lb

## 2023-07-23 DIAGNOSIS — Z3A37 37 weeks gestation of pregnancy: Secondary | ICD-10-CM

## 2023-07-23 DIAGNOSIS — O0990 Supervision of high risk pregnancy, unspecified, unspecified trimester: Secondary | ICD-10-CM

## 2023-07-23 DIAGNOSIS — O34219 Maternal care for unspecified type scar from previous cesarean delivery: Secondary | ICD-10-CM

## 2023-07-23 NOTE — Progress Notes (Signed)
ROB. Patient states daily fetal movement with no pain or pressure. Aware of GBS+ result, would like to discuss being retested at the hospital.

## 2023-07-23 NOTE — Patient Instructions (Signed)
Group B Streptococcus Infection During Pregnancy Group B Streptococcus (GBS) is a type of bacteria that is often found in healthy people. It is commonly found in the rectum, vagina, and intestines. In people who are healthy and not pregnant, the bacteria rarely cause serious illness or complications. However, women who test positive for GBS during pregnancy can pass the bacteria to the baby during childbirth. This can cause serious infection in the baby after birth. Women with GBS may also have infections during their pregnancy or soon after childbirth. The infections include urinary tract infections (UTIs) or infections of the uterus. GBS also increases a woman's risk of complications during pregnancy, such as early labor or delivery, miscarriage, or stillbirth. Routine testing for GBS is recommended for all pregnant women. What are the causes? This condition is caused by bacteria called Streptococcus agalactiae. What increases the risk? You may have a higher risk for GBS infection during pregnancy if you had one during a past pregnancy. What are the signs or symptoms? In most cases, GBS infection does not cause symptoms in pregnant women. If symptoms exist, they may include: Labor that starts before the 37th week of pregnancy. A UTI or bladder infection. This may cause a fever, frequent urination, or pain and burning during urination. Fever during labor. There can also be a rapid heartbeat in the mother or baby. Rare but serious symptoms of a GBS infection in women include: Blood infection (septicemia). This may cause fever, chills, or confusion. Lung infection (pneumonia). This may cause fever, chills, cough, rapid breathing, chest pain, or difficulty breathing. Bone, joint, skin, or soft tissue infection. How is this diagnosed? You may be screened for GBS between week 35 and week 37 of pregnancy. If you have symptoms of preterm labor, you may be screened earlier. This condition is diagnosed  based on lab test results from: A swab of fluid from the vagina and rectum. A urine sample. How is this treated? This condition is treated with antibiotic medicine. Antibiotic medicine may be given: To you when you go into labor, or as soon as your water breaks. The medicines will continue until after you give birth. If you are having a cesarean delivery, you do not need antibiotics unless your water has broken. To your baby, if he or she requires treatment. Your health care provider will check your baby to decide if he or she needs antibiotics to prevent a serious infection. Follow these instructions at home: Take over-the-counter and prescription medicines only as told by your health care provider. Take your antibiotic medicine as told by your health care provider. Do not stop taking the antibiotic even if you start to feel better. Keep all pre-birth (prenatal) visits and follow-up visits as told by your health care provider. This is important. Contact a health care provider if: You have pain or burning when you urinate. You have to urinate more often than usual. You have a fever or chills. You develop a bad-smelling vaginal discharge. Get help right away if: Your water breaks. You go into labor. You have severe pain in your abdomen. You have difficulty breathing. You have chest pain. These symptoms may represent a serious problem that is an emergency. Do not wait to see if the symptoms will go away. Get medical help right away. Call your local emergency services (911 in the [place] Do not drive yourself to the hospital. Summary GBS is a type of bacteria that is common in healthy people. During pregnancy, colonization with GBS can cause  serious complications for you or your baby. Your health care provider will screen you between 35 and 37 weeks of pregnancy to determine if you are colonized with GBS. If you are colonized with GBS during pregnancy, your health care provider will recommend  antibiotics through an IV during labor. After delivery, your baby will be evaluated for complications related to potential GBS infection and may require antibiotics to prevent a serious infection. This information is not intended to replace advice given to you by your health care provider. Make sure you discuss any questions you have with your health care provider. Document Revised: 05/04/2022 Document Reviewed: 05/04/2022 Elsevier Patient Education  2024 [employer] Inc[employer]Third Trimester of Pregnancy  The third trimester of pregnancy is from week 28 through week 40. This is months 7 through 9. The third trimester is a time when your baby is growing fast. Body changes during your third trimester Your body continues to change during this time. The changes usually go away after your baby is born. Physical changes You will continue to gain weight. You may get stretch marks on your hips, belly, and breasts. Your breasts will keep growing and may hurt. A yellow fluid (colostrum) may leak from your breasts. This is the first milk you're making for your baby. Your hair may grow faster and get thicker. In some cases, you may get hair loss. Your belly button may stick out. You may have more swelling in your hands, face, or ankles. Health changes You may have heartburn. You may feel short of breath. This is caused by the uterus that is now bigger. You may have more aches in the pelvis, back, or thighs. You may have more tingling or numbness in your hands, arms, and legs. You may pee more often. You may have trouble pooping (constipation) or swollen veins in the butt that can itch or get painful (hemorrhoids). Other changes You may have more problems sleeping. You may notice the baby moving lower in your belly (dropping). You may have more fluid coming from your vagina. Your joints may feel loose, and you may have pain around your pelvic bone. Follow these instructions at home: Medicines Take  medicines only as told by your health care provider. Some medicines are not safe during pregnancy. Your provider may change the medicines that you take. Do not take any medicines unless told to by your provider. Take a prenatal vitamin that has at least 600 micrograms (mcg) of folic acid. Do not use herbal medicines, illegal drugs, or medicines that are not approved by your provider. Eating and drinking While you're pregnant your body needs additional nutrition to help support your growing baby. Talk with your provider about your nutritional needs. Activity Most women are able to exercise regularly during pregnancy. Exercise routines may need to change at the end of your pregnancy. Talk to your provider about your activities and exercise routine. Relieving pain and discomfort Rest often with your legs raised if you have leg cramps or low back pain. Take warm sitz baths to soothe pain from hemorrhoids. Use hemorrhoid cream if your provider says it's okay. Wear a good, supportive bra if your breasts hurt. Do not use hot tubs, steam rooms, or saunas. Do not douche. Do not use tampons or scented pads. Safety Talk to your provider before traveling far distances. Wear your seatbelt at all times when you're in a car. Talk to your provider if someone hits you, hurts you, or yells at you. Preparing for birth To prepare for  your baby: Take childbirth and breastfeeding classes. Visit the hospital and tour the maternity area. Buy a rear-facing car seat. Learn how to install it in your car. General instructions Avoid cat litter boxes and soil used by cats. These things carry germs that can cause harm to your pregnancy and your baby. Do not drink alcohol, smoke, vape, or use products with nicotine or tobacco in them. If you need help quitting, talk with your provider. Keep all follow-up visits for your third trimester. Your provider will do more exams and tests during this trimester. Write down your  questions. Take them to your prenatal visits. Your provider also will: Talk with you about your overall health. Give you advice or refer you to specialists who can help with different needs, including: Mental health and counseling. Foods and healthy eating. Ask for help if you need help with food. Where to find more information American Pregnancy Association: americanpregnancy.org [employer]: acog.org Office on [employer] Health: [employer] Contact a health care provider if: You have a headache that does not go away when you take medicine. You have any of these problems: You can't eat or drink. You have nausea and vomiting. You have watery poop (diarrhea) for 2 days or more. You have pain when you pee, or your pee smells bad. You have been sick for 2 days or more and aren't getting better. Contact your provider right away if: You have any of these coming from your vagina: Abnormal discharge. Bad-smelling fluid. Bleeding. Your baby is moving less than usual. You have signs of labor: You have any contractions, belly cramping, or have pain in your pelvis or lower back before 37 weeks of pregnancy (preterm labor). You have regular contractions that are less than 5 minutes apart. Your water breaks. You have symptoms of high blood pressure or preeclampsia. These include: A severe, throbbing headache that does not go away. Sudden or extreme swelling of your face, hands, legs, or feet. Vision problems: You see spots. You have blurry vision. Your eyes are sensitive to light. If you can't reach your provider, go to an urgent care or emergency room. Get help right away if: You faint, become confused, or can't think clearly. You have chest pain or trouble breathing. You have any kind of injury, such as from a fall or a car crash. These symptoms may be an emergency. Call 911 right away. Do not wait to see if the symptoms will go away. Do not  drive yourself to the hospital. This information is not intended to replace advice given to you by your health care provider. Make sure you discuss any questions you have with your health care provider. Document Revised: 02/18/2023 Document Reviewed: 09/18/2022 Elsevier Patient Education  2024 [employer].

## 2023-07-23 NOTE — Assessment & Plan Note (Signed)
Reviewed positive GBS swab. Tiffany Conner asking about retesting. Reviewed recommendations would not change if retest was negative. Recommended using Evidence based birth for statistics of GBS transmission to help her decision for antibiotics.  Ready for baby! Reviewed labor warning signs and expectations for birth. Instructed to call office or come to hospital with persistent headache, vision changes, regular contractions, leaking of fluid, decreased fetal movement or vaginal bleeding.

## 2023-07-23 NOTE — Progress Notes (Signed)
    Return Prenatal Note   Assessment/Plan   Plan  33 y.o. G3P2002 at [redacted]w[redacted]d presents for follow-up OB visit. Reviewed prenatal record including previous visit note.  Supervision of high risk pregnancy, antepartum Reviewed positive GBS swab. [place] asking about retesting. Reviewed recommendations would not change if retest was negative. Recommended using Evidence based birth for statistics of GBS transmission to help her decision for antibiotics.  Ready for baby! Reviewed labor warning signs and expectations for birth. Instructed to call office or come to hospital with persistent headache, vision changes, regular contractions, leaking of fluid, decreased fetal movement or vaginal bleeding.     No orders of the defined types were placed in this encounter.  Return in about 1 week (around 07/30/2023) for ROB.   Future Appointments  Date Time Provider Department Center  07/30/2023  8:55 AM Free, Lindalou Hose, CNM [facility] None    For next visit:  continue with routine prenatal care     Subjective   33 y.o. [id number] at [redacted]w[redacted]d presents for this follow-up prenatal visit.  Patient has questions about GBS testing and need for antibiotics Patient reports: Movement: Present  Objective   Flow sheet Vitals: Pulse Rate: 79 BP: 123/71 Fundal Height: 36 cm Fetal Heart Rate (bpm): 150 Presentation: Vertex Total weight gain: 44 lb 11.2 oz (20.3 kg)  General Appearance  No acute distress, well appearing, and well nourished Pulmonary   Normal work of breathing Neurologic   Alert and oriented to person, place, and time Psychiatric   Mood and affect within normal limits  Oley Balm, CNM  02/21/254:25 PM

## 2023-07-30 ENCOUNTER — Ambulatory Visit (INDEPENDENT_AMBULATORY_CARE_PROVIDER_SITE_OTHER): Payer: Self-pay

## 2023-07-30 VITALS — BP 115/65 | HR 97 | Wt 209.0 lb

## 2023-07-30 DIAGNOSIS — O34219 Maternal care for unspecified type scar from previous cesarean delivery: Secondary | ICD-10-CM

## 2023-07-30 DIAGNOSIS — Z98891 History of uterine scar from previous surgery: Secondary | ICD-10-CM

## 2023-07-30 DIAGNOSIS — K649 Unspecified hemorrhoids: Secondary | ICD-10-CM

## 2023-07-30 DIAGNOSIS — O0990 Supervision of high risk pregnancy, unspecified, unspecified trimester: Secondary | ICD-10-CM

## 2023-07-30 NOTE — Progress Notes (Signed)
    Return Prenatal Note   Assessment/Plan   Plan  33 y.o. G3P2002 at [redacted]w[redacted]d presents for follow-up OB visit. Reviewed prenatal record including previous visit note.  Supervision of high risk pregnancy, antepartum - Feeling ready for baby. Has plans for her other children to stay with MIL when labor starts.  - Reviewed labor warning signs and expectations for birth. Instructed to call office or come to hospital with persistent headache, vision changes, regular contractions, leaking of fluid, decreased fetal movement or vaginal bleeding.   No orders of the defined types were placed in this encounter.  Return in about 1 week (around 08/06/2023) for ROB.   Future Appointments  Date Time Provider Department Center  08/06/2023  3:15 PM Glenetta Borg, CNM [facility] None  08/13/2023  3:15 PM Tacey Dimaggio, Lindalou Hose, CNM [facility] None    For next visit:  continue with routine prenatal care     Subjective   33 y.o. [id number] at [redacted]w[redacted]d presents for this follow-up prenatal visit.  Patient doing well, no complaints. Patient reports: Movement: Present Contractions: Irritability  Objective   Flow sheet Vitals: Pulse Rate: 97 BP: 115/65 Fundal Height: 38 cm Fetal Heart Rate (bpm): 135 Presentation: Vertex Total weight gain: 44 lb (20 kg)  General Appearance  No acute distress, well appearing, and well nourished Pulmonary   Normal work of breathing Neurologic   Alert and oriented to person, place, and time Psychiatric   Mood and affect within normal limits  Lindalou Hose Francheska Villeda, CNM  02/28/259:09 AM

## 2023-07-30 NOTE — Assessment & Plan Note (Addendum)
-   Feeling ready for baby. Has plans for her other children to stay with MIL when labor starts.  - Reviewed labor warning signs and expectations for birth. Instructed to call office or come to hospital with persistent headache, vision changes, regular contractions, leaking of fluid, decreased fetal movement or vaginal bleeding.

## 2023-08-06 ENCOUNTER — Ambulatory Visit: Payer: Self-pay | Admitting: Obstetrics

## 2023-08-06 ENCOUNTER — Encounter: Payer: Self-pay | Admitting: Obstetrics

## 2023-08-06 VITALS — BP 111/64 | HR 76 | Wt 211.0 lb

## 2023-08-06 DIAGNOSIS — O0990 Supervision of high risk pregnancy, unspecified, unspecified trimester: Secondary | ICD-10-CM

## 2023-08-06 DIAGNOSIS — L408 Other psoriasis: Secondary | ICD-10-CM

## 2023-08-06 DIAGNOSIS — O34219 Maternal care for unspecified type scar from previous cesarean delivery: Secondary | ICD-10-CM

## 2023-08-06 DIAGNOSIS — K649 Unspecified hemorrhoids: Secondary | ICD-10-CM

## 2023-08-06 DIAGNOSIS — O09299 Supervision of pregnancy with other poor reproductive or obstetric history, unspecified trimester: Secondary | ICD-10-CM

## 2023-08-06 NOTE — Progress Notes (Signed)
    Return Prenatal Note   Assessment/Plan   Plan  33 y.o. G3P2002 at [redacted]w[redacted]d presents for follow-up OB visit. Reviewed prenatal record including previous visit note.  Supervision of high risk pregnancy, antepartum - Anticipatory guidance reviewed  - Danger signs reviewed  - Vertex presentation confirmed by BSUS - Prefers to await spontaneous labor. Discussed antenatal surveillance after 40 weeks   No orders of the defined types were placed in this encounter.  Return in about 1 week (around 08/13/2023).   Future Appointments  Date Time Provider Department Center  08/13/2023  3:15 PM Free, Lindalou Hose, CNM [facility] None    For next visit:  ROB and NST    Subjective  Tiffany Conner is feeling ready to have baby! She is drinking red raspberry leaf tea, eating dates, and using primrose oil. Has had mild cramping on and off the last week. She would like to stay away from induction if possible. Denies VB, LOF.  Movement: Present Contractions: Irritability  Objective   Flow sheet Vitals: Pulse Rate: 76 BP: 111/64 Fundal Height: 39 cm Fetal Heart Rate (bpm): 130 Total weight gain: 46 lb (20.9 kg)  General Appearance  No acute distress, well appearing, and well nourished Pulmonary   Normal work of breathing Neurologic   Alert and oriented to person, place, and time Psychiatric   Mood and affect within normal limits  Tiffany Conner, SNM 08/06/23 4:00 PM

## 2023-08-06 NOTE — Assessment & Plan Note (Signed)
-   Anticipatory guidance reviewed  - Danger signs reviewed  - Vertex presentation confirmed by BSUS

## 2023-08-13 ENCOUNTER — Other Ambulatory Visit: Payer: Self-pay

## 2023-08-13 ENCOUNTER — Inpatient Hospital Stay: Admit: 2023-08-13 | Payer: Self-pay

## 2023-08-13 ENCOUNTER — Ambulatory Visit (INDEPENDENT_AMBULATORY_CARE_PROVIDER_SITE_OTHER): Payer: Self-pay

## 2023-08-13 VITALS — BP 120/64 | HR 88 | Wt 211.0 lb

## 2023-08-13 DIAGNOSIS — O480 Post-term pregnancy: Secondary | ICD-10-CM

## 2023-08-13 DIAGNOSIS — O34219 Maternal care for unspecified type scar from previous cesarean delivery: Secondary | ICD-10-CM

## 2023-08-13 DIAGNOSIS — Z3A40 40 weeks gestation of pregnancy: Secondary | ICD-10-CM

## 2023-08-13 DIAGNOSIS — O0990 Supervision of high risk pregnancy, unspecified, unspecified trimester: Secondary | ICD-10-CM

## 2023-08-13 DIAGNOSIS — K649 Unspecified hemorrhoids: Secondary | ICD-10-CM

## 2023-08-13 DIAGNOSIS — Z98891 History of uterine scar from previous surgery: Secondary | ICD-10-CM

## 2023-08-13 NOTE — Patient Instructions (Signed)

## 2023-08-13 NOTE — Assessment & Plan Note (Signed)
-   RNST today in clinic. Reviewed delivery planning. Strongly desires spontaneous labor and to await induction until 42 weeks. Reviewed risks associated with post-dates pregnancy. Will plan for BPP and ROB and IOL scheduling in 1 week .  Considering membrane sweep at that time.  - Reviewed labor warning signs and expectations for birth. Instructed to call office or come to hospital with persistent headache, vision changes, regular contractions, leaking of fluid, decreased fetal movement or vaginal bleeding.

## 2023-08-13 NOTE — Progress Notes (Signed)
    Return Prenatal Note   Assessment/Plan   Plan  33 y.o. G3P2002 at [redacted]w[redacted]d presents for follow-up OB visit. Reviewed prenatal record including previous visit note.  Supervision of high risk pregnancy, antepartum - RNST today in clinic. Reviewed delivery planning. Strongly desires spontaneous labor and to await induction until 42 weeks. Reviewed risks associated with post-dates pregnancy. Will plan for BPP and ROB and IOL scheduling in 1 week .  Considering membrane sweep at that time.  - Reviewed labor warning signs and expectations for birth. Instructed to call office or come to hospital with persistent headache, vision changes, regular contractions, leaking of fluid, decreased fetal movement or vaginal bleeding.    Orders Placed This Encounter  Procedures   US FETAL BPP WO NON STRESS    Standing Status:   Future    Expected Date:   08/20/2023    Expiration Date:   08/12/2024    Reason for Exam (SYMPTOM  OR DIAGNOSIS REQUIRED):   postdates    Preferred Imaging Location?:   Internal   Return BPP and rob 1 week.   No future appointments.  For next visit:   ROB with BPP     Subjective   33 y.o. [id number] at [redacted]w[redacted]d presents for this follow-up prenatal visit.  Patient having some irregular contractions, no loss of mucous plug or bloody show.  Patient reports: Movement: Present Contractions: Irritability  Objective   Flow sheet Vitals: Pulse Rate: 88 BP: 120/64 Fundal Height: 38 cm Fetal Heart Rate (bpm): RNST Total weight gain: 46 lb (20.9 kg)  General Appearance  No acute distress, well appearing, and well nourished Pulmonary   Normal work of breathing Neurologic   Alert and oriented to person, place, and time Psychiatric   Mood and affect within normal limits  Lindalou Hose Carizma Dunsworth, CNM  03/14/253:23 PM

## 2023-08-19 ENCOUNTER — Ambulatory Visit: Payer: Self-pay

## 2023-08-19 VITALS — BP 132/61 | HR 79 | Wt 212.4 lb

## 2023-08-19 DIAGNOSIS — Z3A40 40 weeks gestation of pregnancy: Secondary | ICD-10-CM

## 2023-08-19 DIAGNOSIS — O480 Post-term pregnancy: Secondary | ICD-10-CM

## 2023-08-19 DIAGNOSIS — O0990 Supervision of high risk pregnancy, unspecified, unspecified trimester: Secondary | ICD-10-CM

## 2023-08-19 DIAGNOSIS — O34219 Maternal care for unspecified type scar from previous cesarean delivery: Secondary | ICD-10-CM

## 2023-08-19 NOTE — Assessment & Plan Note (Addendum)
-   Extensive discussion of r/b/a of spontaneous labor vs induction vs repeat cesarean delivery in the setting of a postdates pregnancy. Previously discussed increased risks after 41 weeks of placental insufficiency, low amniotic fluid, large baby, MSF, and stillbirth. Also discussed increase of risk of uterine rupture with the use of pitocin. Reviewed relative increase in risk vs absolute risk. Patient is now leaning towards planned repeat CD if she has not gone into spontaneous labor by 42 weeks. Tentative induction spot held for 3/28 at 0500. Patient will discuss with husband and decide by appointment on Monday whether she wants to proceed with induction or repeat CD.  - Plan for ROB with NST on 3/24 and ROB with BPP on 3/26.

## 2023-08-19 NOTE — Progress Notes (Signed)
    Return Prenatal Note   Assessment/Plan   Plan  33 y.o. G3P2002 at [redacted]w[redacted]d presents for follow-up OB visit. Reviewed prenatal record including previous visit note.  Desires VBAC (vaginal birth after cesarean) trial - Extensive discussion of r/b/a of spontaneous labor vs induction vs repeat cesarean delivery in the setting of a postdates pregnancy. Previously discussed increased risks after 41 weeks of placental insufficiency, low amniotic fluid, large baby, MSF, and stillbirth. Also discussed increase of risk of uterine rupture with the use of pitocin. Reviewed relative increase in risk vs absolute risk. Patient is now leaning towards planned repeat CD if she has not gone into spontaneous labor by 42 weeks. Tentative induction spot held for 3/28 at 0500. Patient will discuss with husband and decide by appointment on Monday whether she wants to proceed with induction or repeat CD.  - Plan for ROB with NST on 3/24 and ROB with BPP on 3/26.   Supervision of high risk pregnancy, antepartum - BPP 8/8 today, EFW: 3542 g, 7lb 13oz .  - SVE cl/th/high. Desires membrane sweep at next appointment if able.  - Reviewed labor warning signs and expectations for birth. Instructed to call office or come to hospital with persistent headache, vision changes, regular contractions, leaking of fluid, decreased fetal movement or vaginal bleeding.   Orders Placed This Encounter  Procedures   US FETAL BPP WO NON STRESS    Standing Status:   Future    Expected Date:   08/25/2023    Expiration Date:   08/18/2024    Reason for Exam (SYMPTOM  OR DIAGNOSIS REQUIRED):   postdates    Preferred Imaging Location?:   Internal   No follow-ups on file.   Future Appointments  Date Time Provider Department Center  08/23/2023  2:15 PM [facility] ROOM [facility] None  08/23/2023  2:55 PM Joliyah Lippens, Lindalou Hose, CNM [facility] None  08/25/2023 11:00 AM [facility]  08/25/2023  2:35 PM Dominica Severin, CNM [facility] None    For  next visit:  continue with routine prenatal care     Subjective   33 y.o. [id number] at [redacted]w[redacted]d presents for this follow-up prenatal visit.  Patient has questions about postdates planning.  Patient reports: Movement: Present Contractions: Regular  Flow sheet Vitals: Pulse Rate: 79 BP: 132/61 Fundal Height: 40 cm Fetal Heart Rate (bpm): 130 Presentation: Vertex (confirmed by [place]) Total weight gain: 47 lb 6.4 oz (21.5 kg)  General Appearance  No acute distress, well appearing, and well nourished Pulmonary   Normal work of breathing Neurologic   Alert and oriented to person, place, and time Psychiatric   Mood and affect within normal limits  Lindalou Hose Anniebell Bedore, CNM  03/20/254:51 PM

## 2023-08-19 NOTE — Assessment & Plan Note (Signed)
-   BPP 8/8 today, EFW: 3542 g, 7lb 13oz .  - SVE cl/th/high. Desires membrane sweep at next appointment if able.  - Reviewed labor warning signs and expectations for birth. Instructed to call office or come to hospital with persistent headache, vision changes, regular contractions, leaking of fluid, decreased fetal movement or vaginal bleeding.

## 2023-08-23 ENCOUNTER — Ambulatory Visit (INDEPENDENT_AMBULATORY_CARE_PROVIDER_SITE_OTHER): Payer: Self-pay

## 2023-08-23 ENCOUNTER — Other Ambulatory Visit: Payer: Self-pay

## 2023-08-23 VITALS — BP 122/64 | HR 81 | Wt 211.6 lb

## 2023-08-23 DIAGNOSIS — O0990 Supervision of high risk pregnancy, unspecified, unspecified trimester: Secondary | ICD-10-CM

## 2023-08-23 DIAGNOSIS — Z3A41 41 weeks gestation of pregnancy: Secondary | ICD-10-CM

## 2023-08-23 DIAGNOSIS — O34219 Maternal care for unspecified type scar from previous cesarean delivery: Secondary | ICD-10-CM

## 2023-08-23 DIAGNOSIS — O480 Post-term pregnancy: Secondary | ICD-10-CM

## 2023-08-23 NOTE — Patient Instructions (Signed)

## 2023-08-23 NOTE — Progress Notes (Signed)
    Return Prenatal Note   Assessment/Plan   Plan  33 y.o. G3P2002 at [redacted]w[redacted]d presents for follow-up OB visit. Reviewed prenatal record including previous visit note.  Desires VBAC (vaginal birth after cesarean) trial - After discussion with her husband, patient desires to schedule planned repeat CD on 3/28 if she has not gone into spontaneous labor prior to that date. Dr. Logan Bores contacted.   Supervision of high risk pregnancy, antepartum - Reactive NST today in clinic. Has BPP and ROB scheduled for 3/26.  - Declines membrane sweep today, is concerned it will increase risk since she is GBS positive. May consider at visit on Wednesday. - Reviewed labor warning signs and expectations for birth. Instructed to call office or come to hospital with persistent headache, vision changes, regular contractions, leaking of fluid, decreased fetal movement or vaginal bleeding.    No orders of the defined types were placed in this encounter.  No follow-ups on file.   Future Appointments  Date Time Provider Department Center  08/25/2023 11:00 AM [facility]  08/25/2023  2:35 PM Dominica Severin, CNM [facility] None    For next visit:   ROB with BPP     Subjective   33 y.o. [id number] at [redacted]w[redacted]d presents for this follow-up prenatal visit.  Patient feeling some increased cramping but no increase in mucous or bloody show. Patient reports: Movement: Present Contractions: Irritability  Objective   Flow sheet Vitals: Pulse Rate: 81 BP: 122/64 Fundal Height: 38 cm Fetal Heart Rate (bpm): RNST Presentation: Vertex Total weight gain: 46 lb 9.6 oz (21.1 kg)  General Appearance  No acute distress, well appearing, and well nourished Pulmonary   Normal work of breathing Neurologic   Alert and oriented to person, place, and time Psychiatric   Mood and affect within normal limits  Tiffany Conner, CNM  03/24/253:22 PM

## 2023-08-23 NOTE — Assessment & Plan Note (Addendum)
-   Reactive NST today in clinic. Has BPP and ROB scheduled for 3/26.  - Declines membrane sweep today, is concerned it will increase risk since she is GBS positive. May consider at visit on Wednesday. - Reviewed labor warning signs and expectations for birth. Instructed to call office or come to hospital with persistent headache, vision changes, regular contractions, leaking of fluid, decreased fetal movement or vaginal bleeding.

## 2023-08-23 NOTE — Assessment & Plan Note (Addendum)
-   After discussion with her husband, patient desires to schedule planned repeat CD on 3/28 if she has not gone into spontaneous labor prior to that date. Dr. Logan Bores contacted.

## 2023-08-25 ENCOUNTER — Ambulatory Visit (INDEPENDENT_AMBULATORY_CARE_PROVIDER_SITE_OTHER): Payer: Self-pay | Admitting: Certified Nurse Midwife

## 2023-08-25 ENCOUNTER — Ambulatory Visit
Admission: RE | Admit: 2023-08-25 | Discharge: 2023-08-25 | Disposition: A | Discharge status: Home / Self Care | Payer: Self-pay | Source: Ambulatory Visit

## 2023-08-25 VITALS — BP 126/75 | HR 81 | Wt 211.0 lb

## 2023-08-25 DIAGNOSIS — O480 Post-term pregnancy: Secondary | ICD-10-CM

## 2023-08-25 DIAGNOSIS — Z3A41 41 weeks gestation of pregnancy: Secondary | ICD-10-CM

## 2023-08-25 DIAGNOSIS — O0990 Supervision of high risk pregnancy, unspecified, unspecified trimester: Secondary | ICD-10-CM | POA: Insufficient documentation

## 2023-08-25 LAB — POCT URINALYSIS DIPSTICK
Bilirubin, UA: NEGATIVE
Blood, UA: NEGATIVE
Glucose, UA: NEGATIVE
Ketones, UA: NEGATIVE
Leukocytes, UA: NEGATIVE
Nitrite, UA: NEGATIVE
Protein, UA: NEGATIVE
Spec Grav, UA: 1.015 (ref 1.010–1.025)
Urobilinogen, UA: 0.2 U/dL
pH, UA: 6 (ref 5.0–8.0)

## 2023-08-25 NOTE — Patient Instructions (Signed)
 Third Trimester of Pregnancy  The third trimester of pregnancy is from week 28 through week 40. This is months 7 through 9. The third trimester is a time when your baby is growing fast. Body changes during your third trimester Your body continues to change during this time. The changes usually go away after your baby is born. Physical changes You will continue to gain weight. You may get stretch marks on your hips, belly, and breasts. Your breasts will keep growing and may hurt. A yellow fluid (colostrum) may leak from your breasts. This is the first milk you're making for your baby. Your hair may grow faster and get thicker. In some cases, you may get hair loss. Your belly button may stick out. You may have more swelling in your hands, face, or ankles. Health changes You may have heartburn. You may feel short of breath. This is caused by the uterus that is now bigger. You may have more aches in the pelvis, back, or thighs. You may have more tingling or numbness in your hands, arms, and legs. You may pee more often. You may have trouble pooping (constipation) or swollen veins in the butt that can itch or get painful (hemorrhoids). Other changes You may have more problems sleeping. You may notice the baby moving lower in your belly (dropping). You may have more fluid coming from your vagina. Your joints may feel loose, and you may have pain around your pelvic bone. Follow these instructions at home: Medicines Take medicines only as told by your health care provider. Some medicines are not safe during pregnancy. Your provider may change the medicines that you take. Do not take any medicines unless told to by your provider. Take a prenatal vitamin that has at least 600 micrograms (mcg) of folic acid. Do not use herbal medicines, illegal drugs, or medicines that are not approved by your provider. Eating and drinking While you're pregnant your body needs additional nutrition to help  support your growing baby. Talk with your provider about your nutritional needs. Activity Most women are able to exercise regularly during pregnancy. Exercise routines may need to change at the end of your pregnancy. Talk to your provider about your activities and exercise routine. Relieving pain and discomfort Rest often with your legs raised if you have leg cramps or low back pain. Take warm sitz baths to soothe pain from hemorrhoids. Use hemorrhoid cream if your provider says it's okay. Wear a good, supportive bra if your breasts hurt. Do not use hot tubs, steam rooms, or saunas. Do not douche. Do not use tampons or scented pads. Safety Talk to your provider before traveling far distances. Wear your seatbelt at all times when you're in a car. Talk to your provider if someone hits you, hurts you, or yells at you. Preparing for birth To prepare for your baby: Take childbirth and breastfeeding classes. Visit the hospital and tour the maternity area. Buy a rear-facing car seat. Learn how to install it in your car. General instructions Avoid cat litter boxes and soil used by cats. These things carry germs that can cause harm to your pregnancy and your baby. Do not drink alcohol, smoke, vape, or use products with nicotine or tobacco in them. If you need help quitting, talk with your provider. Keep all follow-up visits for your third trimester. Your provider will do more exams and tests during this trimester. Write down your questions. Take them to your prenatal visits. Your provider also will: Talk with you about  your overall health. Give you advice or refer you to specialists who can help with different needs, including: Mental health and counseling. Foods and healthy eating. Ask for help if you need help with food. Where to find more information American Pregnancy Association: americanpregnancy.org [employer] of [employer]ians and Gynecologists: acog.org Office on [employer] Health:  TravelLesson.ca Contact a health care provider if: You have a headache that does not go away when you take medicine. You have any of these problems: You can't eat or drink. You have nausea and vomiting. You have watery poop (diarrhea) for 2 days or more. You have pain when you pee, or your pee smells bad. You have been sick for 2 days or more and aren't getting better. Contact your provider right away if: You have any of these coming from your vagina: Abnormal discharge. Bad-smelling fluid. Bleeding. Your baby is moving less than usual. You have signs of labor: You have any contractions, belly cramping, or have pain in your pelvis or lower back before 37 weeks of pregnancy (preterm labor). You have regular contractions that are less than 5 minutes apart. Your water breaks. You have symptoms of high blood pressure or preeclampsia. These include: A severe, throbbing headache that does not go away. Sudden or extreme swelling of your face, hands, legs, or feet. Vision problems: You see spots. You have blurry vision. Your eyes are sensitive to light. If you can't reach your provider, go to an urgent care or emergency room. Get help right away if: You faint, become confused, or can't think clearly. You have chest pain or trouble breathing. You have any kind of injury, such as from a fall or a car crash. These symptoms may be an emergency. Call 911 right away. Do not wait to see if the symptoms will go away. Do not drive yourself to the hospital. This information is not intended to replace advice given to you by your health care provider. Make sure you discuss any questions you have with your health care provider. Document Revised: 02/18/2023 Document Reviewed: 09/18/2022 Elsevier Patient Education  2024 [employer].

## 2023-08-25 NOTE — Progress Notes (Signed)
    Return Prenatal Note   Subjective   33 y.o. Z6X0960 at 103w5d presents for this follow-up prenatal visit.  Patient feeling well, still hopeful for labor prior to scheduled C/S Friday. Patient reports: Movement: Present Contractions: Irregular  Objective   Flow sheet Vitals: Pulse Rate: 81 BP: 126/75 Fetal Heart Rate (bpm): 140 Presentation: Vertex Dilation: Closed Effacement (%): 30 Station: -2 Total weight gain: 46 lb (20.9 kg)  General Appearance  No acute distress, well appearing, and well nourished Pulmonary   Normal work of breathing Neurologic   Alert and oriented to person, place, and time Psychiatric   Mood and affect within normal limits  Assessment/Plan   Plan  33 y.o. [id number] at [redacted]w[redacted]d presents for follow-up OB visit. Reviewed prenatal record including previous visit note.  Supervision of high risk pregnancy, antepartum Reviewed kick counts and preterm labor warning signs. Instructed to call office or come to hospital with persistent headache, vision changes, regular contractions, leaking of fluid, decreased fetal movement or vaginal bleeding. Reviewed recommendation for delivery by 42w due to increased risk of stillbirth, decreased placental function and fetal size. Tiffany Conner is ready to meet baby and does not want an induction due to her experience in her first birth. Cesarean is scheduled Friday if no labor prior.      Orders Placed This Encounter  Procedures   POCT Urinalysis Dipstick   No follow-ups on file.   No future appointments.  For next visit:   C/S Friday if no labor prior     Tiffany Conner, CNM  03/26/255:34 PM

## 2023-08-25 NOTE — Assessment & Plan Note (Signed)
 Reviewed kick counts and preterm labor warning signs. Instructed to call office or come to hospital with persistent headache, vision changes, regular contractions, leaking of fluid, decreased fetal movement or vaginal bleeding. Reviewed recommendation for delivery by 42w due to increased risk of stillbirth, decreased placental function and fetal size. Tiffany Conner is ready to meet baby and does not want an induction due to her experience in her first birth. Cesarean is scheduled Friday if no labor prior.

## 2023-08-26 ENCOUNTER — Inpatient Hospital Stay
Admission: EM | Admit: 2023-08-26 | Discharge: 2023-08-28 | DRG: 806 | Disposition: A | Discharge status: Home / Self Care | Payer: Self-pay

## 2023-08-26 ENCOUNTER — Inpatient Hospital Stay: Payer: MEDICAID | Admitting: General Practice

## 2023-08-26 ENCOUNTER — Encounter: Payer: Self-pay | Admitting: Obstetrics and Gynecology

## 2023-08-26 ENCOUNTER — Other Ambulatory Visit: Payer: Self-pay

## 2023-08-26 DIAGNOSIS — Z3A41 41 weeks gestation of pregnancy: Secondary | ICD-10-CM

## 2023-08-26 DIAGNOSIS — O480 Post-term pregnancy: Principal | ICD-10-CM | POA: Diagnosis present

## 2023-08-26 DIAGNOSIS — O0990 Supervision of high risk pregnancy, unspecified, unspecified trimester: Principal | ICD-10-CM

## 2023-08-26 DIAGNOSIS — Q210 Ventricular septal defect: Secondary | ICD-10-CM

## 2023-08-26 DIAGNOSIS — O34219 Maternal care for unspecified type scar from previous cesarean delivery: Secondary | ICD-10-CM | POA: Diagnosis present

## 2023-08-26 DIAGNOSIS — Z98891 History of uterine scar from previous surgery: Secondary | ICD-10-CM

## 2023-08-26 LAB — CBC
HCT: 39.9 % (ref 36.0–46.0)
HCT: 26.7 % — ABNORMAL LOW (ref 36.0–46.0)
Hemoglobin: 13.9 g/dL (ref 12.0–15.0)
Hemoglobin: 9.2 g/dL — ABNORMAL LOW (ref 12.0–15.0)
MCH: 30.3 pg (ref 26.0–34.0)
MCH: 30.5 pg (ref 26.0–34.0)
MCHC: 34.8 g/dL (ref 30.0–36.0)
MCHC: 34.5 g/dL (ref 30.0–36.0)
MCV: 86.9 fL (ref 80.0–100.0)
MCV: 88.4 fL (ref 80.0–100.0)
Platelets: 295 10*3/uL (ref 150–400)
Platelets: 280 10*3/uL (ref 150–400)
RBC: 4.59 MIL/uL (ref 3.87–5.11)
RBC: 3.02 MIL/uL — ABNORMAL LOW (ref 3.87–5.11)
RDW: 12.3 % (ref 11.5–15.5)
RDW: 12.6 % (ref 11.5–15.5)
WBC: 11 10*3/uL — ABNORMAL HIGH (ref 4.0–10.5)
WBC: 17.4 10*3/uL — ABNORMAL HIGH (ref 4.0–10.5)
nRBC: 0 % (ref 0.0–0.2)
nRBC: 0 % (ref 0.0–0.2)

## 2023-08-26 LAB — TYPE AND SCREEN
ABO/RH(D): A POS
Antibody Screen: NEGATIVE

## 2023-08-26 LAB — RPR: RPR Ser Ql: NONREACTIVE

## 2023-08-26 MED ORDER — OXYCODONE-ACETAMINOPHEN 5-325 MG PO TABS: 2.0000 | ORAL_TABLET | ORAL | Status: DC | PRN

## 2023-08-26 MED ORDER — PHENYLEPHRINE 80 MCG/ML (10ML) SYRINGE FOR IV PUSH (FOR BLOOD PRESSURE SUPPORT): 80.0000 ug | PREFILLED_SYRINGE | INTRAVENOUS | Status: DC | PRN

## 2023-08-26 MED ORDER — BUPIVACAINE HCL (PF) 0.25 % IJ SOLN
INTRAMUSCULAR | Status: DC | PRN
  Administered 2023-08-26 (×2): 5 mL via EPIDURAL

## 2023-08-26 MED ORDER — BENZOCAINE-MENTHOL 20-0.5 % EX AERO
1.0000 | INHALATION_SPRAY | CUTANEOUS | Status: DC | PRN
  Filled 2023-08-26 (×2): qty 56

## 2023-08-26 MED ORDER — COCONUT OIL OIL
1.0000 | TOPICAL_OIL | Status: DC | PRN
  Filled 2023-08-26: qty 7.5

## 2023-08-26 MED ORDER — FENTANYL-BUPIVACAINE-NACL 0.5-0.125-0.9 MG/250ML-% EP SOLN
12.0000 mL/h | EPIDURAL | Status: DC | PRN
  Administered 2023-08-26: 12 mL/h via EPIDURAL

## 2023-08-26 MED ORDER — ONDANSETRON HCL 4 MG/2ML IJ SOLN
4.0000 mg | Freq: Four times a day (QID) | INTRAMUSCULAR | Status: DC | PRN
  Administered 2023-08-26: 4 mg via INTRAVENOUS
  Filled 2023-08-26: qty 2

## 2023-08-26 MED ORDER — LACTATED RINGERS IV SOLN: INTRAVENOUS | Status: DC

## 2023-08-26 MED ORDER — EPHEDRINE 5 MG/ML INJ: 10.0000 mg | INTRAVENOUS | Status: DC | PRN

## 2023-08-26 MED ORDER — LIDOCAINE HCL (PF) 1 % IJ SOLN
INTRAMUSCULAR | Status: DC | PRN
  Administered 2023-08-26: 3 mL

## 2023-08-26 MED ORDER — WITCH HAZEL-GLYCERIN EX PADS
MEDICATED_PAD | CUTANEOUS | Status: DC | PRN
  Filled 2023-08-26 (×2): qty 100

## 2023-08-26 MED ORDER — LACTATED RINGERS IV SOLN
500.0000 mL | Freq: Once | INTRAVENOUS | Status: AC
  Administered 2023-08-26: 500 mL via INTRAVENOUS

## 2023-08-26 MED ORDER — ACETAMINOPHEN 325 MG PO TABS
650.0000 mg | ORAL_TABLET | ORAL | Status: DC | PRN
  Filled 2023-08-26 (×3): qty 2

## 2023-08-26 MED ORDER — OXYCODONE-ACETAMINOPHEN 5-325 MG PO TABS: 1.0000 | ORAL_TABLET | ORAL | Status: DC | PRN

## 2023-08-26 MED ORDER — OXYTOCIN BOLUS FROM INFUSION
333.0000 mL | Freq: Once | INTRAVENOUS | Status: AC
  Administered 2023-08-26: 333 mL via INTRAVENOUS

## 2023-08-26 MED ORDER — SIMETHICONE 80 MG PO CHEW: 80.0000 mg | CHEWABLE_TABLET | ORAL | Status: DC | PRN

## 2023-08-26 MED ORDER — OXYTOCIN-SODIUM CHLORIDE 30-0.9 UT/500ML-% IV SOLN
2.5000 [IU]/h | INTRAVENOUS | Status: DC
  Administered 2023-08-26: 2.5 [IU]/h via INTRAVENOUS
  Filled 2023-08-26: qty 500

## 2023-08-26 MED ORDER — IBUPROFEN 600 MG PO TABS
600.0000 mg | ORAL_TABLET | Freq: Four times a day (QID) | ORAL | Status: DC
  Administered 2023-08-27 – 2023-08-28 (×5): 600 mg via ORAL
  Filled 2023-08-26 (×5): qty 1

## 2023-08-26 MED ORDER — TRANEXAMIC ACID-NACL 1000-0.7 MG/100ML-% IV SOLN
INTRAVENOUS | Status: AC
  Filled 2023-08-26: qty 100

## 2023-08-26 MED ORDER — DOCUSATE SODIUM 100 MG PO CAPS
100.0000 mg | ORAL_CAPSULE | Freq: Two times a day (BID) | ORAL | Status: DC
  Administered 2023-08-27 (×2): 100 mg via ORAL
  Filled 2023-08-26 (×2): qty 1

## 2023-08-26 MED ORDER — LIDOCAINE HCL (PF) 1 % IJ SOLN
30.0000 mL | INTRAMUSCULAR | Status: AC | PRN
  Administered 2023-08-26: 30 mL via SUBCUTANEOUS
  Filled 2023-08-26: qty 30

## 2023-08-26 MED ORDER — DIPHENHYDRAMINE HCL 25 MG PO CAPS: 25.0000 mg | ORAL_CAPSULE | Freq: Four times a day (QID) | ORAL | Status: DC | PRN

## 2023-08-26 MED ORDER — PRENATAL MULTIVITAMIN CH
1.0000 | ORAL_TABLET | Freq: Every day | ORAL | Status: DC
  Administered 2023-08-27: 1 via ORAL
  Filled 2023-08-26: qty 1

## 2023-08-26 MED ORDER — IBUPROFEN 600 MG PO TABS
ORAL_TABLET | ORAL | Status: AC
  Administered 2023-08-26: 600 mg via ORAL
  Filled 2023-08-26: qty 1

## 2023-08-26 MED ORDER — LACTATED RINGERS IV SOLN
500.0000 mL | INTRAVENOUS | Status: DC | PRN
  Administered 2023-08-26: 1000 mL via INTRAVENOUS

## 2023-08-26 MED ORDER — OXYTOCIN 10 UNIT/ML IJ SOLN: 10.0000 [IU] | Freq: Once | INTRAMUSCULAR | Status: DC

## 2023-08-26 MED ORDER — LIDOCAINE-EPINEPHRINE (PF) 1.5 %-1:200000 IJ SOLN
INTRAMUSCULAR | Status: DC | PRN
  Administered 2023-08-26: 5 mL via PERINEURAL

## 2023-08-26 MED ORDER — DIPHENHYDRAMINE HCL 50 MG/ML IJ SOLN: 12.5000 mg | INTRAMUSCULAR | Status: DC | PRN

## 2023-08-26 MED ORDER — FENTANYL-BUPIVACAINE-NACL 0.5-0.125-0.9 MG/250ML-% EP SOLN
EPIDURAL | Status: AC
  Filled 2023-08-26: qty 250

## 2023-08-26 MED ORDER — ACETAMINOPHEN 325 MG PO TABS
650.0000 mg | ORAL_TABLET | ORAL | Status: DC | PRN
  Administered 2023-08-26 – 2023-08-27 (×3): 650 mg via ORAL

## 2023-08-26 MED ORDER — TRANEXAMIC ACID-NACL 1000-0.7 MG/100ML-% IV SOLN
1000.0000 mg | INTRAVENOUS | Status: AC
  Administered 2023-08-26: 1000 mg via INTRAVENOUS

## 2023-08-26 MED ORDER — SOD CITRATE-CITRIC ACID 500-334 MG/5ML PO SOLN: 30.0000 mL | ORAL | Status: DC | PRN

## 2023-08-26 MED ORDER — OXYTOCIN-SODIUM CHLORIDE 30-0.9 UT/500ML-% IV SOLN: 2.5000 [IU]/h | INTRAVENOUS | Status: DC | PRN

## 2023-08-26 NOTE — Anesthesia Preprocedure Evaluation (Signed)
 Anesthesia Evaluation  Patient identified by MRN, date of birth, ID band Patient awake    Reviewed: Allergy & Precautions, H&P , NPO status , Patient's Chart, lab work & pertinent test results  Airway Mallampati: II  TM Distance: >3 FB Neck ROM: full    Dental no notable dental hx. (+) Teeth Intact   Pulmonary neg pulmonary ROS   Pulmonary exam normal breath sounds clear to auscultation       Cardiovascular Exercise Tolerance: Good Normal cardiovascular exam+ Valvular Problems/Murmurs  Rhythm:regular Rate:Normal  Hx systolic murmur, VSD seen on echo several yrs ago, asymptomatic   Neuro/Psych negative neurological ROS  negative psych ROS   GI/Hepatic negative GI ROS, Neg liver ROS,,,  Endo/Other  negative endocrine ROS    Renal/GU negative Renal ROS  negative genitourinary   Musculoskeletal   Abdominal   Peds  Hematology negative hematology ROS (+)   Anesthesia Other Findings   Reproductive/Obstetrics (+) Pregnancy                              Anesthesia Physical Anesthesia Plan  ASA: 2  Anesthesia Plan: Epidural   Post-op Pain Management:    Induction:   PONV Risk Score and Plan:   Airway Management Planned:   Additional Equipment:   Intra-op Plan:   Post-operative Plan:   Informed Consent: I have reviewed the patients History and Physical, chart, labs and discussed the procedure including the risks, benefits and alternatives for the proposed anesthesia with the patient or authorized representative who has indicated his/her understanding and acceptance.     Dental Advisory Given  Plan Discussed with: Anesthesiologist and CRNA  Anesthesia Plan Comments:          Anesthesia Quick Evaluation

## 2023-08-26 NOTE — OB Triage Note (Signed)
 Patient is a 33 yo, G3P2, at 41 weeks 6 days. Patient presents with complaints of contractions that started at 0200. Patient rates them 8/10 on the pain scale.  Patient denies any LOF and reports some bloody show. Patient reports +FM. Monitors applied and assessing. VSS. Initial fetal heart tone 135. [place] CNM notified of patients arrival to unit. Plan to place in LDR2 for admission. Patient expresses difficulty coping with the pain of the contractions and is requesting pain relief via epidural. Patients labor plans discussed, at this time patient declines IV abx until 18+ hrs with ruptured membranes despite GBS+ status. CNM  updated.

## 2023-08-26 NOTE — H&P (Signed)
 [facility] Labor & Delivery  History and Physical  ASSESSMENT AND PLAN   Tiffany Conner is a 33 y.o. G3P2002 at [redacted]w[redacted]d with EDD: 08/13/2023, by Last Menstrual Period admitted for labor management.  Labor Management -Requesting epidural -Declines GBS antibiotics  -Anticipate SVD  Fetal Status: - cephalic presentation by Leopolds - Continuous  - FHT currently cat 1  TOLAC - c/s x2 , previous sections for fetal intolerance and then repeat -Continuous fetal monitoring -Consented prenatally  VSD -followed by cardiology   Labs/Immunizations: Prenatal labs and studies: ABO, Rh:  A+ Antibody:  neg Rubella:  Immune Varicella:   Immune RPR: Non Reactive (12/13 1109)  HBsAg:   neg HepC:  neg HIV: Non Reactive (12/13 1109)  GBS: Positive/-- (02/10 1617)   TDAP: declined Flu: declined RSV: yes/no  Postpartum Plan: - Feeding: Breast Milk - Contraception: plans none - Prenatal Care Provider: [facility]     HPI   Chief Complaint: Contractions  Tiffany Conner is a 33 y.o. G3P2002 at [redacted]w[redacted]d who presents for labor management for contractions that started at 0200. SVE in triage 1/70/-2. She is scheduled for repeat C/S tomorrow but desires to [facility] if possible.    Patient denies vaginal bleeding or LOF.  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   HISTORY   Medications Medications Prior to Admission  Medication  Sig Dispense Refill Last Dose/Taking   Prenatal Vit-Fe Fumarate-FA (MULTIVITAMIN-PRENATAL) 27-0.8 MG TABS tablet Take 1 tablet by mouth daily at 12 noon.       Allergies has no known allergies.   OB History OB History  Gravida Para Term Preterm AB Living  3 2 2  0 0 2  SAB IAB Ectopic Multiple Live Births  0 0 0 0 2    # Outcome Date GA Lbr Len/2nd Weight Sex Type Anes PTL Lv  3 Current           2 Term 09/12/20 [redacted]w[redacted]d  3660 g F CS-LTranv Spinal  LIV     Name: ALEKSIS, JIGGETTS     Apgar1: 8  Apgar5: 9  1 Term 02/27/18 [redacted]w[redacted]d  2270 g M CS-LTranv EPI  LIV     Birth Comments: sga     Name: Criss Rosales     Apgar1: 8  Apgar5: 9    Past Medical History Past Medical History:  Diagnosis Date   Elevated blood pressure reading    Heart murmur    VSD (ventricular septal defect)     Past Surgical History Past Surgical History:  Procedure Laterality Date   CESAREAN SECTION N/A 02/27/2018   Procedure: CESAREAN SECTION;  Surgeon: Vena Austria, MD;  Location: [facility] ORS;  Service: Obstetrics;  Laterality: N/A;   CESAREAN SECTION N/A 09/12/2020   Procedure: CESAREAN SECTION;  Surgeon: Vena Austria, MD;  Location: [facility] ORS;  Service: Obstetrics;  Laterality: N/A;   MYRINGOTOMY     as an infant   TIBIA FRACTURE SURGERY      Social History  reports that she has never smoked. She has never used smokeless tobacco. She reports that she does not drink alcohol and does not use drugs.   Family History family history includes Ovarian cancer in an other family member.   PHYSICAL EXAM   There were no vitals filed for this visit.  Constitutional: No acute distress, well appearing, and well nourished. Neurologic: She is alert and conversational.  Psychiatric: She has a normal mood and affect.  Musculoskeletal: Normal gait, grossly normal range of motion Cardiovascular: Normal rate.   Pulmonary/Chest: Normal work of breathing.  Gastrointestinal/Abdominal: Soft. Gravid.  There is no tenderness.  Skin: Skin is warm and dry. No rash noted.  Genitourinary: Normal external female genitalia.  SVE:  Dilation: 1 Effacement (%): 70 Cervical Position: Posterior Station: -2 Presentation: Vertex Exam by:: Greson RN   NST Interpretation Indication: Contractions Baseline: 135 bpm Variability: moderate Accelerations: present Decelerations: none Contractions: regular, every 4-5 minutes Time noted:  See OBIX Impression: reactive Authenticated by: Oley Balm   Attending Dr. Valentino Saxon was immediately available for the care of the patient.   Oley Balm, CNM

## 2023-08-26 NOTE — Anesthesia Procedure Notes (Signed)
 Epidural Patient location during procedure: OB Start time: 08/26/2023 8:55 AM End time: 08/26/2023 9:15 AM  Staffing Anesthesiologist: Stephanie Coup, MD Resident/CRNA: Johney Maine D, CRNA Performed: resident/CRNA   Preanesthetic Checklist Completed: patient identified, IV checked, site marked, risks and benefits discussed, surgical consent, monitors and equipment checked, pre-op evaluation and timeout performed  Epidural Patient position: sitting Prep: ChloraPrep Patient monitoring: heart rate, continuous pulse ox and blood pressure Approach: midline Location: L3-L4 Injection technique: LOR saline  Needle:  Needle type: Tuohy  Needle gauge: 17 G Needle length: 9 cm and 9 Needle insertion depth: 6 cm Catheter type: closed end flexible Catheter size: 19 Gauge Catheter at skin depth: 12 cm Test dose: negative and 1.5% lidocaine with Epi 1:200 K  Assessment Sensory level: T10 Events: blood not aspirated, no cerebrospinal fluid, injection not painful, no injection resistance, no paresthesia and negative IV test  Additional Notes 1 attempt Pt. Evaluated and documentation done after procedure finished. Patient identified. Risks/Benefits/Options discussed with patient including but not limited to bleeding, infection, nerve damage, paralysis, failed block, incomplete pain control, headache, blood pressure changes, nausea, vomiting, reactions to medication both or allergic, itching and postpartum back pain. Confirmed with bedside nurse the patient's most recent platelet count. Confirmed with patient that they are not currently taking any anticoagulation, have any bleeding history or any family history of bleeding disorders. Patient expressed understanding and wished to proceed. All questions were answered. Sterile technique was used throughout the entire procedure. Please see nursing notes for vital signs. Test dose was given through epidural catheter and negative prior to continuing to  dose epidural or start infusion. Warning signs of high block given to the patient including shortness of breath, tingling/numbness in hands, complete motor block, or any concerning symptoms with instructions to call for help. Patient was given instructions on fall risk and not to get out of bed. All questions and concerns addressed with instructions to call with any issues or inadequate analgesia.    Patient tolerated the insertion well without immediate complications.Reason for block:procedure for pain

## 2023-08-26 NOTE — Progress Notes (Signed)
 Labor Progress Note   ASSESSMENT/PLAN   Tiffany Conner 33 y.o.   [id number]  at [redacted]w[redacted]d here with spontaneous labor/TOLAC.  FWB:  - Fetal well being assessed: Cat II, variable decelerations with contractions, variability moderate      GBS: - GBS Positive/-- (02/10 1617), declines penicillin prophylaxis unless ruptured for 18+ hours or exhibiting signs of infection  LABOR: - Now in early labor, doing well. - Pain Management: epidural - On latest SVE, initially felt no cervix but on further exam, pinpoint cervix noted. With patient consent, cervical stretching performed. Cervix easily opened to 4 cm with bulging bag. Will continue with expectant management - Anticipate VBAC  Active Problems: TOLAC with Hx of 2 prior CS VSD  Labor Progress: 0730 1/70/-2 1045 4/100/-1  SUBJECTIVE/OBJECTIVE   SUBJECTIVE:  Very comfortable with epidural. Utilizing peanut ball in various side-lying positions.    OBJECTIVE: Vital Signs: Patient Vitals for the past 12 hrs:  BP Temp Temp src Pulse Resp SpO2  08/26/23 1041 (!) 108/47 -- -- 78 -- 100 %  08/26/23 1024 (!) 111/53 -- -- -- -- --  08/26/23 0955 -- -- -- -- -- 100 %  08/26/23 0954 (!) 110/57 -- -- 79 -- --  08/26/23 0950 -- -- -- -- -- 100 %  08/26/23 0945 -- -- -- -- -- 98 %  08/26/23 0940 -- -- -- -- -- 100 %  08/26/23 0939 (!) 111/58 -- -- (!) 113 -- --  08/26/23 0935 -- -- -- -- -- 100 %  08/26/23 0934 134/63 -- -- 100 -- --  08/26/23 0930 -- -- -- -- -- 100 %  08/26/23 0929 115/79 -- -- 99 -- --  08/26/23 0925 -- -- -- -- -- 100 %  08/26/23 0924 (!) 117/56 -- -- 97 -- --  08/26/23 0920 125/69 -- -- 94 -- 100 %  08/26/23 0916 119/70 -- -- 94 -- 99 %  08/26/23 0910 -- -- -- -- -- 100 %  08/26/23 0905 -- -- -- -- -- 100 %  08/26/23 0900 -- -- -- -- -- 99 %  08/26/23 0857 116/81 98.2 F (36.8 C) Axillary 99 20 --    Last SVE:  Dilation: 4 Effacement (%): 100 Cervical Position: Middle Station: -2 Presentation: Vertex Exam  by:: Jamorian Dimaria CNM -  ,  ,  ,    FHR:   - Mode: External  - Baseline Rate (A): 140 bpm  -    - Characteristics (ie - accels, decels): Accelerations: None  -    UTERINE ACTIVITY:   - Mode: Toco  - Contraction Frequency (min): 2-4 minutes

## 2023-08-26 NOTE — Discharge Summary (Signed)
 Postpartum Discharge Summary  Date of Service updated 08/28/23      Patient Name: Tiffany Conner DOB: 13-Aug-1990 MRN: [id number]  Date of admission: 08/26/2023 Delivery date:08/26/2023 Delivering provider: Eloise Levels, SNM FREE, SARAH, CNM Date of discharge: 08/28/2023  Admitting diagnosis: Labor and delivery indication for care or intervention [O75.9] Intrauterine pregnancy: [redacted]w[redacted]d     Secondary diagnosis:  Principal Problem:   Labor and delivery indication for care or intervention Active Problems:   VSD   History of 2 cesarean sections   Supervision of high risk pregnancy, antepartum  Additional problems: PPH, GBS untreated (pt declined)    Discharge diagnosis: VBAC                                              Post partum procedures: iron infusion Augmentation: AROM Complications: None  Hospital course: Onset of Labor With Vaginal Delivery      33 y.o. yo G3P2002 at [redacted]w[redacted]d was admitted in early labor on 08/26/2023. Labor course was complicated by postpartum hemorrhage.  Membrane Rupture Time/Date: 2:50 PM,08/26/2023  Delivery Method:VBAC, Spontaneous Operative Delivery:N/A Episiotomy: None Lacerations:  Vaginal;Sulcus;Perineal Patient had an uncomplicated postpartum course.  She is ambulating, tolerating a regular diet, passing flatus, and urinating well. Patient is discharged home in stable condition on 08/28/23.  Newborn Data: Birth date:08/26/2023 Birth time:5:02 PM Gender:Female Living status:  Apgars:7 ,9  Weight:3190 g  Magnesium Sulfate received: No BMZ received: No Rhophylac:N/A MMR:N/A T-DaP:Given prenatally Flu: Yes RSV Vaccine received: No Transfusion:No Immunizations administered: Immunization History  Administered Date(s) Administered   Td 02/03/2007   Tdap 01/19/2018, 07/19/2020    Physical exam  Vitals:   08/27/23 1120 08/27/23 1500 08/27/23 1925 08/27/23 2253  BP: 120/61 134/70 139/80 123/64  Pulse: 78 75 93 78  Resp: 19 17 18  18   Temp: 98.7 F (37.1 C) 98.8 F (37.1 C) 98.4 F (36.9 C) 98 F (36.7 C)  TempSrc: Oral Oral Oral Oral  SpO2:   100% 99%  Weight:      Height:       General: alert, cooperative, and no distress Lochia: appropriate Uterine Fundus: firm Laceration: Healing well with no significant drainage DVT Evaluation: No evidence of DVT seen on physical exam. Labs: Lab Results  Component Value Date   WBC 13.4 (H) 08/27/2023   HGB 8.4 (L) 08/27/2023   HCT 24.3 (L) 08/27/2023   MCV 89.7 08/27/2023   PLT 222 08/27/2023      Latest Ref Rng & Units 09/12/2020   11:06 AM  CMP  Glucose 70 - 99 mg/dL 93   BUN 6 - 20 mg/dL 10   Creatinine 4.09 - 1.00 mg/dL 8.11   Sodium 914 - 782 mmol/L 136   Potassium 3.5 - 5.1 mmol/L 4.3   Chloride 98 - 111 mmol/L 107   CO2 22 - 32 mmol/L 23   Calcium 8.9 - 10.3 mg/dL 8.2   Total Protein 6.5 - 8.1 g/dL 5.8   Total Bilirubin 0.3 - 1.2 mg/dL 0.5   Alkaline Phos 38 - 126 U/L 104   AST 15 - 41 U/L 22   ALT 0 - 44 U/L 13    Edinburgh Score:    08/27/2023   10:08 AM  Edinburgh Postnatal Depression Scale Screening Tool  I have been able to laugh and see the funny side of things. 0  I have looked forward with enjoyment to things. 0  I have blamed myself unnecessarily when things went wrong. 0  I have been anxious or worried for no good reason. 0  I have felt scared or panicky for no good reason. 0  Things have been getting on top of me. 0  I have been so unhappy that I have had difficulty sleeping. 0  I have felt sad or miserable. 0  I have been so unhappy that I have been crying. 0  The thought of harming myself has occurred to me. 0  Edinburgh Postnatal Depression Scale Total 0      After visit meds:  Allergies as of 08/28/2023   No Known Allergies      Medication List     TAKE these medications    ferrous sulfate 325 (65 FE) MG EC tablet Take 1 tablet (325 mg total) by mouth daily with breakfast.   multivitamin-prenatal 27-0.8 MG  Tabs tablet Take 1 tablet by mouth daily at 12 noon.          Discharge home in stable condition Infant Feeding: Breast Infant Disposition:home with mother Discharge instruction: per After Visit Summary and Postpartum booklet. Activity: Advance as tolerated. Pelvic rest for 6 weeks.  Diet: routine diet Anticipated Birth Control:  NFP Postpartum Appointment:6 weeks Additional Postpartum F/U: Postpartum Depression checkup Future Appointments: Future Appointments  Date Time Provider Department Center  09/08/2023  2:55 PM Free, Lindalou Hose, CNM [facility] None  10/05/2023  3:15 PM Free, Lindalou Hose, CNM [facility] None   Follow up Visit:  Follow-up Information     Free, Lindalou Hose, CNM. Schedule an appointment as soon as possible for a visit.   Specialty: Obstetrics and Gynecology Why: In two weeks for telehealth mood check and in 6 weeks for in-person visit Contact information: [address] [place] [address] [phone]                     08/28/2023 Glenetta Borg, CNM

## 2023-08-26 NOTE — Progress Notes (Signed)
    OB/GYN Attending Progress Note    I was called in to assess patient for heavy bleeding immediately following delivery by Autumn Messing, CNM.  Patient is a 33 y.o. G3P2002 at [redacted]w[redacted]d who presented for latent labor desiring TOLAC after history of 2 C-sections.  Patient has successfully delivered vaginally, however deep vaginal possible sulcal tear noted with extensive bleeding. Also with a second degree perineal repair, being repaired by midwife upon my arrival. Pressure applied to other bleeding site. Pressure removed to reveal deep vaginal tear of upper right fornix of vagina below clitoral region. A running suture of 3-0 Vicryl was used to reapproximate the area. Good hemostasis noted of this site.  Small amount of brisk bleeding still noted from the vagina, further exploration noted another laceration on left vaginal side wall. This was also repaired with two figure-of-eight stitch of 3-0 Vicryl.  Hemostasis achieved. No further active bleeding noted from the vagina. QBL 1000 ml.       Hildred Laser, MD [facility]

## 2023-08-26 NOTE — Significant Event (Signed)
 After discussion with partner, patient desires AROM. AROM performed to clear fluid. Will continue to expectantly manage labor.

## 2023-08-26 NOTE — Progress Notes (Signed)
 Labor Progress Note   ASSESSMENT/PLAN   Tiffany Conner 33 y.o.   [id number]  at [redacted]w[redacted]d here with spontaneous labor/TOLAC.  FWB:  - Fetal well being assessed: Cat I, prolonged deceleration, periods of minimal variability, have tried various maternal position changes     GBS: - GBS Positive/-- (02/10 1617) , declines prophylaxis with penicillin at this time  LABOR: - Now in active labor, doing well. - Pain Management: epidural - Reviewed FHR tracing and labor progress. Discussed options with patient including AROM. Patient will consider and let team know if she would like to proceed with AROM. - Anticipate VBAC.  Active Problems: TOLAC with Hx of 2 prior CS VSD   Labor Progress: 0730 1/70/-2 1045 4/100/-1 1415 8/100/-1  SUBJECTIVE/OBJECTIVE   SUBJECTIVE:  Feeling increased pressure with contractions. Otherwise comfortable and doing well.   OBJECTIVE: Vital Signs: Patient Vitals for the past 12 hrs:  BP Temp Temp src Pulse Resp SpO2 Height Weight  08/26/23 1239 119/64 98.6 F (37 C) Axillary 95 18 -- -- --  08/26/23 1139 100/89 -- -- -- -- -- -- --  08/26/23 1041 (!) 108/47 -- -- 78 -- 100 % -- --  08/26/23 1024 (!) 111/53 -- -- -- -- -- -- --  08/26/23 0955 -- -- -- -- -- 100 % -- --  08/26/23 0954 (!) 110/57 -- -- 79 -- -- -- --  08/26/23 0950 -- -- -- -- -- 100 % -- --  08/26/23 0945 -- -- -- -- -- 98 % -- --  08/26/23 0940 -- -- -- -- -- 100 % -- --  08/26/23 0939 (!) 111/58 -- -- (!) 113 -- -- -- --  08/26/23 0935 -- -- -- -- -- 100 % -- --  08/26/23 0934 134/63 -- -- 100 -- -- -- --  08/26/23 0930 -- -- -- -- -- 100 % -- --  08/26/23 0929 115/79 -- -- 99 -- -- -- --  08/26/23 0925 -- -- -- -- -- 100 % -- --  08/26/23 0924 (!) 117/56 -- -- 97 -- -- -- --  08/26/23 0920 125/69 -- -- 94 -- 100 % -- --  08/26/23 0916 119/70 -- -- 94 -- 99 % -- --  08/26/23 0910 -- -- -- -- -- 100 % -- --  08/26/23 0905 -- -- -- -- -- 100 % -- --  08/26/23 0900 -- -- -- -- -- 99 %  -- --  08/26/23 0857 116/81 98.2 F (36.8 C) Axillary 99 20 -- 5\' 6"  (1.676 m) 95.7 kg    Last SVE:  Dilation: 8 Effacement (%): 100 Cervical Position: Middle Station: -1 Presentation: Vertex Exam by:: Bernell Haynie CNM -  ,  ,  ,    FHR:   - Mode: External  - Baseline Rate (A): 150 bpm  -    - Characteristics (ie - accels, decels): Accelerations: None  -    UTERINE ACTIVITY:   - Mode: Toco  - Contraction Frequency (min): 1.5-6 minutes

## 2023-08-27 LAB — CBC
HCT: 24.3 % — ABNORMAL LOW (ref 36.0–46.0)
Hemoglobin: 8.4 g/dL — ABNORMAL LOW (ref 12.0–15.0)
MCH: 31 pg (ref 26.0–34.0)
MCHC: 34.6 g/dL (ref 30.0–36.0)
MCV: 89.7 fL (ref 80.0–100.0)
Platelets: 222 10*3/uL (ref 150–400)
RBC: 2.71 MIL/uL — ABNORMAL LOW (ref 3.87–5.11)
RDW: 12.7 % (ref 11.5–15.5)
WBC: 13.4 10*3/uL — ABNORMAL HIGH (ref 4.0–10.5)
nRBC: 0 % (ref 0.0–0.2)

## 2023-08-27 MED ORDER — ALBUTEROL SULFATE (2.5 MG/3ML) 0.083% IN NEBU: 2.5000 mg | INHALATION_SOLUTION | Freq: Once | RESPIRATORY_TRACT | Status: DC | PRN

## 2023-08-27 MED ORDER — DIPHENHYDRAMINE HCL 50 MG/ML IJ SOLN: 25.0000 mg | Freq: Once | INTRAMUSCULAR | Status: DC | PRN

## 2023-08-27 MED ORDER — METHYLPREDNISOLONE SODIUM SUCC 125 MG IJ SOLR: 125.0000 mg | Freq: Once | INTRAMUSCULAR | Status: DC | PRN

## 2023-08-27 MED ORDER — SODIUM CHLORIDE 0.9 % IV BOLUS: 500.0000 mL | Freq: Once | INTRAVENOUS | Status: DC | PRN

## 2023-08-27 MED ORDER — IRON SUCROSE 500 MG IVPB - SIMPLE MED
500.0000 mg | Freq: Once | INTRAVENOUS | Status: AC
  Administered 2023-08-27: 500 mg via INTRAVENOUS
  Filled 2023-08-27: qty 500

## 2023-08-27 MED ORDER — EPINEPHRINE 0.3 MG/0.3ML IJ SOAJ: 0.3000 mg | Freq: Once | INTRAMUSCULAR | Status: DC | PRN

## 2023-08-27 MED ORDER — SODIUM CHLORIDE 0.9 % IV SOLN: INTRAVENOUS | Status: AC | PRN

## 2023-08-27 NOTE — Lactation Note (Signed)
 This note was copied from a baby's chart. Lactation Consultation Note  Patient Name: Tiffany Conner Today's Date: 08/27/2023 Age:32 hours     Maternal Data  MOB awake with infant swaddled in crib asleep upon LC room entry.   Feeding  No feeding observed, advised to contact LC if any concerns present during hospital stay. Provided outpatient recommendation for additional services following discharge should any additional issues arise.    Lactation Tools Discussed/Used  MOB reports having previously exclusively breast fed older siblings of infant for 12 months and 16 months, with plans to exclusively breastfeed baby until self weaning. No pump or interventions planned or reported at this time as feeding is going well and MOB denies any pain, discomfort, or concerns.   Interventions  Education provided about what to expect within the first 24 hours of life and upcoming period of cluster feeding, signs of engorgement and mastitis, and when to seek care following discharge. Advised to contact PCP/peds with any medical emergencies and refer to outpatient lactation support if any unanticipated questions or concerns arise.  Discharge    Consult Status  PRN    Lennie Hummer 08/27/2023, 12:53 PM

## 2023-08-27 NOTE — Anesthesia Postprocedure Evaluation (Signed)
 Anesthesia Post Note  Patient: Tiffany Conner  Procedure(s) Performed: AN AD HOC LABOR EPIDURAL  Patient location during evaluation: Mother Baby Anesthesia Type: Epidural Level of consciousness: awake, awake and alert and oriented Pain management: pain level controlled Vital Signs Assessment: post-procedure vital signs reviewed and stable Respiratory status: spontaneous breathing, nonlabored ventilation and respiratory function stable Cardiovascular status: blood pressure returned to baseline and stable Postop Assessment: no headache and no backache Anesthetic complications: no  No notable events documented.   Last Vitals:  Vitals:   08/27/23 0115 08/27/23 0420  BP: 119/61 129/65  Pulse: 88 73  Resp: 20 18  Temp: 36.8 C   SpO2: 100% 100%    Last Pain:  Vitals:   08/27/23 0455  TempSrc:   PainSc: 6                  [employer]

## 2023-08-27 NOTE — Progress Notes (Signed)
 Post Partum Day 0 Subjective: Tiffany Conner is feeling well overall. She is ambulating, voiding, and tolerating POs without difficulty. Her pain is well-controlled and her bleeding is WNL. She is feeling a little better than yesterday. Her mood is stable. Breastfeeding is going well. Will stay overnight d/t untreated GBS.  Objective: Blood pressure 120/61, pulse 78, temperature 98.7 F (37.1 C), temperature source Oral, resp. rate 19, height 5\' 6"  (1.676 m), weight 95.7 kg, last menstrual period 11/06/2022, SpO2 100%, unknown if currently breastfeeding.  Physical Exam:  General: alert and cooperative Lochia: appropriate Uterine Fundus: firm Perineum: healing well DVT Evaluation: No evidence of DVT seen on physical exam.  Recent Labs    08/26/23 2249 08/27/23 0419  HGB 9.2* 8.4*  HCT 26.7* 24.3*    Assessment/Plan: Plan for discharge tomorrow Lactation assistance as needed Iron infusion today Discharge instructions reviewed Plans NFP for contraception Routine PP care   LOS: 1 day   Glenetta Borg, CNM 08/27/2023, 11:50 AM

## 2023-08-27 NOTE — Progress Notes (Signed)
 Patient had gotten up for the first time after delivery to use the bathroom. She walked without assistance to the toilet, and upon standing after using the restroom, "got a little dizzy."  Patient stood for a few seconds with nurse at her side and then proceeded to walk to the wheelchair and sit down. A few minutes later patient passed out in wheelchair. The midwife and three nurses quickly attended to the patient. Patient was given a bolus of lactated ringers and was assisted back to the bed. She had labs drawn and was evaluated by the Midwife, Autumn Messing, and was cleared to be moved to post-partum.

## 2023-08-28 ENCOUNTER — Encounter: Payer: Self-pay | Admitting: Obstetrics and Gynecology

## 2023-08-28 DIAGNOSIS — O480 Post-term pregnancy: Principal | ICD-10-CM

## 2023-08-28 DIAGNOSIS — O99820 Streptococcus B carrier state complicating pregnancy: Secondary | ICD-10-CM

## 2023-08-28 DIAGNOSIS — O34219 Maternal care for unspecified type scar from previous cesarean delivery: Secondary | ICD-10-CM

## 2023-08-28 DIAGNOSIS — Z3A41 41 weeks gestation of pregnancy: Secondary | ICD-10-CM

## 2023-08-28 MED ORDER — FERROUS SULFATE 325 (65 FE) MG PO TBEC: 325.0000 mg | DELAYED_RELEASE_TABLET | Freq: Every day | ORAL | 1 refills | Status: DC

## 2023-08-28 NOTE — Lactation Note (Signed)
 This note was copied from a baby's chart. Lactation Consultation Note  Patient Name: Girl Willamina Grieshop [id number] Date: 08/28/2023 Age:32 hours Reason for consult: Follow-up assessment;Term;Maternal discharge   Maternal Data Has patient been taught Hand Expression?: Yes Does the patient have breastfeeding experience prior to this delivery?: Yes How long did the patient breastfeed?: 16 mths  Feeding Mother's Current Feeding Choice: Breast Milk Baby nursing in cradle hold on left breast when room entered, audible swallows noted LATCH Score Latch: Grasps breast easily, tongue down, lips flanged, rhythmical sucking.  Audible Swallowing: Spontaneous and intermittent  Type of Nipple: Everted at rest and after stimulation  Comfort (Breast/Nipple): Soft / non-tender  Hold (Positioning): No assistance needed to correctly position infant at breast.  LATCH Score: 10   Lactation Tools Discussed/Used  [facility] name and no written on white board for questions concerns before discharge  Interventions Interventions:  (mom requested lanolin packets, no tenderness at present)Instruction in use given   Discharge Discharge Education: Engorgement and breast care Pump: Personal WIC Program: No  Consult Status Consult Status: PRN    Dyann Kief 08/28/2023, 10:23 AM

## 2023-08-28 NOTE — Final Progress Note (Signed)
 Post Partum Day 1 Subjective: Tiffany Conner is feeling well overall. She is ambulating, voiding, and tolerating POs without difficulty. Her pain is well-controlled and her bleeding is WNL. Her mood is stable. Breastfeeding is going well.  Objective: Blood pressure 123/64, pulse 78, temperature 98 F (36.7 C), temperature source Oral, resp. rate 18, height 5\' 6"  (1.676 m), weight 95.7 kg, last menstrual period 11/06/2022, SpO2 99%, unknown if currently breastfeeding.  Physical Exam:  General: alert, cooperative, and appears stated age 33: appropriate Uterine Fundus: firm Perineum: healing well DVT Evaluation: No evidence of DVT seen on physical exam.  Recent Labs    08/26/23 2249 08/27/23 0419  HGB 9.2* 8.4*  HCT 26.7* 24.3*    Assessment/Plan: Discharge home Continue PO iron Discharge instructions reviewed PP visits scheduled NFP for contraception   LOS: 2 days   Glenetta Borg, CNM 08/28/2023, 7:14 AM

## 2023-08-28 NOTE — Progress Notes (Signed)
AVS reviewed.  All questions answered.

## 2023-08-28 NOTE — Discharge Summary (Signed)
 OB Discharge Summary     Patient Name: Tiffany Conner DOB: [birth year] MRN: [id number]  Date of admission: 08/26/2023 Delivering provider: Cindra Eves, SNM Date of Delivery: 08/26/2023  Date of discharge: 08/28/2023  Admitting diagnosis: Labor and delivery indication for care or intervention [O75.9] Intrauterine pregnancy: [redacted]w[redacted]d     Secondary diagnosis:  hx cesarean delivery x 2     Discharge diagnosis: Term Pregnancy Delivered and VBAC                                                                                                Post partum procedures: none  Augmentation: AROM  Complications: Hemorrhage>1058mL  Hospital course:  Onset of Labor With Vaginal Delivery      33 y.o. yo [id number] at [redacted]w[redacted]d was admitted in Latent Labor on 08/26/2023. Labor course was complicated by NA  Membrane Rupture Time/Date: 2:50 PM,08/26/2023  Delivery Method:VBAC, Spontaneous Operative Delivery:N/A Episiotomy: None Lacerations:  Vaginal;Sulcus;Perineal See delivery note for details  Patient had a postpartum course complicated by postpartum hemorrhage 1,000 ml.  She is tolerating regular diet, pain is controlled with PO medication, ambulating and voiding without difficulty. She reports breastfeeding is going well.   Patient is discharged home in stable condition on 08/28/23.  Newborn Data: Birth date:08/26/2023 Birth time:5:02 PM Gender:Female Living status: living Apgars:7 ,9  Weight:3190 g  Physical exam  Vitals:   08/27/23 1500 08/27/23 1925 08/27/23 2253 08/28/23 0818  BP: 134/70 139/80 123/64 126/69  Pulse: 75 93 78 82  Resp: 17 18 18 20   Temp: 98.8 F (37.1 C) 98.4 F (36.9 C) 98 F (36.7 C) 98.5 F (36.9 C)  TempSrc: Oral Oral Oral Oral  SpO2:  100% 99% 98%  Weight:      Height:       General: alert, cooperative, and no distress Lochia: appropriate Uterine Fundus: firm Incision: N/A DVT Evaluation: No evidence of DVT seen on physical exam.  Labs: Lab Results   Component Value Date   WBC 13.4 (H) 08/27/2023   HGB 8.4 (L) 08/27/2023   HCT 24.3 (L) 08/27/2023   MCV 89.7 08/27/2023   PLT 222 08/27/2023    Discharge instruction: per After Visit Summary.  Medications:  Allergies as of 08/28/2023   No Known Allergies      Medication List     TAKE these medications    ferrous sulfate 325 (65 FE) MG EC tablet Take 1 tablet (325 mg total) by mouth daily with breakfast.   multivitamin-prenatal 27-0.8 MG Tabs tablet Take 1 tablet by mouth daily at 12 noon.        Diet: routine diet  Activity: Advance as tolerated. Pelvic rest for 6 weeks.   Outpatient follow up:  Follow-up Information     Free, Lindalou Hose, CNM. Schedule an appointment as soon as possible for a visit.   Specialty: Obstetrics and Gynecology Why: In two weeks for telehealth mood check and in 6 weeks for in-person visit Contact information: [address] [place] [address] [phone]  Postpartum contraception: Natural Family Planning Rhogam Given postpartum: Rh positive Rubella vaccine given postpartum: immune Varicella vaccine given postpartum: immune TDaP given antepartum or postpartum: given antepartum    Newborn Delivery   Birth date/time: 08/26/2023 17:02:00 Delivery type: VBAC, Spontaneous     Baby Feeding: Breast  Disposition:home with mother  SIGNED:  Tresea Mall, CNM 08/28/2023 11:06 AM

## 2023-09-08 ENCOUNTER — Telehealth (INDEPENDENT_AMBULATORY_CARE_PROVIDER_SITE_OTHER): Payer: Self-pay

## 2023-09-08 ENCOUNTER — Encounter: Payer: Self-pay | Admitting: Obstetrics and Gynecology

## 2023-09-08 NOTE — Progress Notes (Signed)
   Virtual Visit via Video Note   I connected withNAME@  on 09/08/23 3:18 PM by a video enabled telemedicine application and verified that I am speaking with the correct person using two identifiers.   Location: Patient: home Provider: [facility]   I discussed the limitations of evaluation and management by telemedicine and the availability of in person appointments. The patient expressed understanding and agreed to proceed. Subjective:    Subjective  Tiffany Conner is a 33 y.o. female who presents for a postpartum visit. She is2 weeks  postpartum following a spontaneous vaginal after two previous cesareans delivery. I have fully reviewed the prenatal and intrapartum course. The delivery was at [redacted]w[redacted]d gestational age.  Anesthesia: epidural.    Postpartum course has been normal. She has no pain and no other concerns. Baby's course has been normal. Baby is feeding by  breast. Bleeding thin lochia. Bowel function is normal. Bladder function is normal.  .  Contraception method:  NFP . Postpartum depression screening: negative, EPDS 2     Review of Systems Pertinent positives noted in HPI. Remainder of comprehensive ROS otherwise negative.     Objective:      Objective  There were no vitals taken for this visit.  General:  alert, cooperative, and no distress        Assessment:    1. Encounter for screening for maternal depression - Screening Negative today. Will rescreen at 6 week postpartum visit.     2. Postpartum state - Overall doing well. Continue routine postpartum home care.    3. Lactating mother - Breastfeeding going well, no concerns.   4. Contraception - Natural family planning   Plan:      Plan  Patient to return to clinic in 4 weeks for in-person postpartum visit.    I discussed the assessment and treatment plan with the patient. The patient was provided an opportunity to ask questions and all were answered. The patient agreed with the plan and  demonstrated an understanding of the instructions.   The patient was advised to call back or seek an in-person evaluation if the symptoms worsen or if the condition fails to improve as anticipated.   I provided 15 minutes of non-face-to-face time during this encounter.     Lindalou Hose Thurman Sarver, CNM

## 2023-10-05 ENCOUNTER — Ambulatory Visit (INDEPENDENT_AMBULATORY_CARE_PROVIDER_SITE_OTHER): Payer: Self-pay

## 2023-10-05 NOTE — Progress Notes (Signed)
 Comprehensive Postpartum Visit Note  Subjective:   Tiffany Conner is an 33 y.o. who presents today for routine postpartum visit. She is 6 week postpartum following a VBAC. I have fully reviewed the prenatal and intrapartum course. The delivery was at 41.6 gestational weeks.  Anesthesia: epidural. Postpartum course has been uncomplicated. Tiffany Conner reports doing well with no concerns today.   Birth: VBAC, Spontaneous, feels good about the birth experience. Laceration: Vaginal;Sulcus;Perineal Bleeding: clear.  Bowel function is normal. Bladder function is normal.  Patient is sexually active. Contraception method is  lactational amenorrhea . Discussed the reliability of this method especially as baby begins to sleep through the night and feedings space out. Patient desires to resume natural family planning which she used prior to pregnancy. Encouraged condoms or other barrier method until regular/predictable periods return Postpartum depression screening: 2 Infant Feeding: Breast  Review of Systems Constitutional: Sleeping interrupted at night due to infant care. Appetite is good. No malaise, no fever, no headaches  EPDS Score: 2  Breasts: no nipple breakdown, no pain, lumps or swelling. Milk supply is sufficient.  Chest: no chest pain, no shortness of breath  Abdomen: no abdominal pain, no nausea or vomiting, no constipation, no incontinence of flatus or stool.   GU: lochia stopped. no discharge or vaginal irritation, no incontinence of urine, no pain with urination. Has not yet resumed intercourse.     Objective:   BP 123/79   Pulse 73   Ht 5\' 6"  (1.676 m)   Wt 89.8 kg   LMP 11/06/2022   Breastfeeding Yes   BMI 31.96 kg/m   Allergies: none  Medications:  Current Outpatient Medications  Medication Instructions   Prenatal Vit-Fe Fumarate-FA (MULTIVITAMIN-PRENATAL) 27-0.8 MG TABS tablet 1 tablet, Daily     OB History  Gravida Para Term Preterm AB Living  3 3 3  0 0 3   SAB IAB Ectopic Multiple Live Births  0 0 0 0 3    # Outcome Date GA Lbr Len/2nd Weight Sex Type Anes PTL Lv  3 Term 08/26/23 [redacted]w[redacted]d 05:53 / 00:24 3190 g F VBAC EPI  LIV  2 Term 09/12/20 [redacted]w[redacted]d  3660 g F CS-LTranv Spinal  LIV  1 Term 02/27/18 [redacted]w[redacted]d  2270 g M CS-LTranv EPI  LIV     Birth Comments: sga    Past Medical History:  Diagnosis Date   Elevated blood pressure reading    Heart murmur    VSD (ventricular septal defect)     Past Surgical History:  Procedure Laterality Date   CESAREAN SECTION N/A 02/27/2018   Procedure: CESAREAN SECTION;  Surgeon: Darl Edu, MD;  Location: [facility] ORS;  Service: Obstetrics;  Laterality: N/A;   CESAREAN SECTION N/A 09/12/2020   Procedure: CESAREAN SECTION;  Surgeon: Darl Edu, MD;  Location: [facility] ORS;  Service: Obstetrics;  Laterality: N/A;   MYRINGOTOMY     as an infant   TIBIA FRACTURE SURGERY      Family History  Problem Relation Age of Onset   Ovarian cancer Other     Social History   Socioeconomic History   Marital status: Married    Spouse name: Adam   Number of children: Not on file   Years of education: 16   Highest education level: Not on file  Occupational History   Occupation: ACC COORDINATOR  Tobacco Use   Smoking status: Never   Smokeless tobacco: Never  Vaping Use   Vaping status: Never Used  Substance and Sexual Activity  Alcohol use: No   Drug use: No   Sexual activity: Yes    Birth control/protection: Pill  Other Topics Concern   Not on file  Social History Narrative   Married.   Works as a Nurse, adult for [employer].    Graduate of [employer].   Enjoys reading exercise.    Social Drivers of Corporate investment banker Strain: Not on file  Food Insecurity: No Food Insecurity (08/26/2023)   Hunger Vital Sign    Worried About Running Out of Food in the Last Year: Never true    Ran Out of Food in the Last Year: Never true  Transportation Needs: No Transportation Needs (08/26/2023)    PRAPARE - Administrator, Civil Service (Medical): No    Lack of Transportation (Non-Medical): No  Physical Activity: Not on file  Stress: Not on file  Social Connections: Not on file    Immunization History  Administered Date(s) Administered   Td 02/03/2007   Tdap 01/19/2018, 07/19/2020     PE:  Neck: no lymphadenopathy, no thyroid enlargement or nodules Breasts: soft, nt without masses, nodules, no lymphadenopathy Heart: RRR Lung: even non labored respirations, no cough or sob Abdomen: deferred, patient has no complaints. GU: Pelvic exam: exam declined by the patient, examination not indicated.      ASSESSMENT/PLAN    Postpartum care following vaginal delivery 6 week PP visit today- no acute concerns. Patient reports feeling good and adjusting well to new routine.  Reports good support system at home and help when needed.   Due for Pap testing- last done 2021- NILM - No HPV done at that time as pt was not yet 30.  Patient unable to complete today and will make appointment for future date.     No follow-ups on file.     [place] Jamacia Jester, Student-MidWife  05/06/253:53 PM

## 2023-10-05 NOTE — Assessment & Plan Note (Addendum)
 6 week PP visit today- no acute concerns. Patient reports feeling good and adjusting well to new routine.  Reports good support system at home and help when needed.   Due for Pap testing- last done 2021- NILM - No HPV done at that time as pt was not yet 31.  Patient unable to complete today and will make appointment for future date.

## 2023-10-11 ENCOUNTER — Ambulatory Visit
Admission: EM | Admit: 2023-10-11 | Discharge: 2023-10-11 | Disposition: A | Discharge status: Home / Self Care | Payer: Self-pay | Attending: Emergency Medicine | Admitting: Emergency Medicine

## 2023-10-11 DIAGNOSIS — J029 Acute pharyngitis, unspecified: Secondary | ICD-10-CM

## 2023-10-11 LAB — POCT RAPID STREP A (OFFICE): Rapid Strep A Screen: NEGATIVE

## 2023-10-11 NOTE — Discharge Instructions (Addendum)
The strep test is negative.  Follow up with your primary care provider if your symptoms are not improving.    

## 2023-10-11 NOTE — ED Triage Notes (Signed)
 Patient to Urgent Care with complaints of sore throat/ headache. Denies any known fevers.   Symptoms started approx 24 hours ago.  Meds: ibuprofen .

## 2023-10-11 NOTE — ED Provider Notes (Signed)
 Tiffany Conner    CSN: [id number] Arrival date & time: 10/11/23  1111      History   Chief Complaint Chief Complaint  Patient presents with   Sore Throat    HPI Tiffany Conner is a 33 y.o. female.  Patient presents with sore throat and headache x 1 day.  No fever, difficulty swallowing, cough, shortness of breath, rash.  She has been treating her symptoms with ibuprofen .  She gave birth in March and is breast-feeding her infant.   The history is provided by the patient and medical records.    Past Medical History:  Diagnosis Date   Elevated blood pressure reading    Heart murmur    VSD (ventricular septal defect)     Patient Active Problem List   Diagnosis Date Noted   Postpartum care following vaginal delivery 08/28/2023   VBAC, delivered 08/28/2023   Encounter for care or examination of lactating mother 08/28/2023   Labor and delivery indication for care or intervention 08/26/2023   Desires VBAC (vaginal birth after cesarean) trial 03/19/2023   Supervision of high risk pregnancy, antepartum 02/12/2023   History of 2 cesarean sections 08/02/2020   Hemorrhoids 09/25/2019   PSORIASIS 03/23/2008   VSD 03/23/2008    Past Surgical History:  Procedure Laterality Date   CESAREAN SECTION N/A 02/27/2018   Procedure: CESAREAN SECTION;  Surgeon: Darl Edu, MD;  Location: [facility] OR[facility];  Service: Obstetrics;  Laterality: N/A;   CESAREAN SECTION N/A 09/12/2020   Procedure: CESAREAN SECTION;  Surgeon: Darl Edu, MD;  Location: [facility] OR[facility];  Service: Obstetrics;  Laterality: N/A;   MYRINGOTOMY     as an infant   TIBIA FRACTURE SURGERY      OB History     Gravida  3   Para  3   Term  3   Preterm  0   AB  0   Living  3      SAB  0   IAB  0   Ectopic  0   Multiple  0   Live Births  3            Home Medications    Prior to Admission medications   Medication Sig Start Date End Date Taking? Authorizing Provider  Prenatal Vit-Fe  Fumarate-FA (MULTIVITAMIN-PRENATAL) 27-0.8 MG TABS tablet Take 1 tablet by mouth daily at 12 noon.    [provider]    Family History Family History  Problem Relation Age of Onset   Ovarian cancer Other     Social History Social History   Tobacco Use   Smoking status: Never   Smokeless tobacco: Never  Vaping Use   Vaping status: Never Used  Substance Use Topics   Alcohol use: No   Drug use: No     Allergies   Patient has no known allergies.   Review of Systems Review of Systems  Constitutional:  Negative for chills and fever.  HENT:  Positive for sore throat. Negative for ear pain.   Respiratory:  Negative for cough and shortness of breath.   Neurological:  Positive for headaches.     Physical Exam Triage Vital Signs ED Triage Vitals  Encounter Vitals Group     BP 10/11/23 1209 131/79     Systolic BP Percentile --      Diastolic BP Percentile --      Pulse Rate 10/11/23 1209 85     Resp 10/11/23 1209 18     Temp 10/11/23 1209  98.8 F (37.1 C)     Temp src --      SpO2 10/11/23 1209 98 %     Weight --      Height --      Head Circumference --      Peak Flow --      Pain Score 10/11/23 1215 6     Pain Loc --      Pain Education --      Exclude from Growth Chart --    No data found.  Updated Vital Signs BP 131/79   Pulse 85   Temp 98.8 F (37.1 C)   Resp 18   SpO2 98%   Breastfeeding Yes   Visual Acuity Right Eye Distance:   Left Eye Distance:   Bilateral Distance:    Right Eye Near:   Left Eye Near:    Bilateral Near:     Physical Exam Constitutional:      General: She is not in acute distress. HENT:     Right Ear: Tympanic membrane normal.     Left Ear: Tympanic membrane normal.     Nose: Nose normal.     Mouth/Throat:     Mouth: Mucous membranes are moist.     Pharynx: Posterior oropharyngeal erythema present.  Cardiovascular:     Rate and Rhythm: Normal rate and regular rhythm.     Heart sounds: Normal heart sounds.   Pulmonary:     Effort: Pulmonary effort is normal. No respiratory distress.     Breath sounds: Normal breath sounds.  Neurological:     Mental Status: She is alert.      UC Treatments / Results  Labs (all labs ordered are listed, but only abnormal results are displayed) Labs Reviewed  POCT RAPID STREP A (OFFICE)    EKG   Radiology No results found.  Procedures Procedures (including critical care time)  Medications Ordered in UC Medications - No data to display  Initial Impression / Assessment and Plan / UC Course  I have reviewed the triage vital signs and the nursing notes.  Pertinent labs & imaging results that were available during my care of the patient were reviewed by me and considered in my medical decision making (see chart for details).    Lactating mother, acute pharyngitis.  Rapid strep negative.  Patient declines COVID test.  Discussed symptomatic treatment including Tylenol  as needed.  Instructed her to follow-up with her PCP if she is not improving.  She agrees to plan of care.  Final Clinical Impressions(s) / UC Diagnoses   Final diagnoses:  Lactating mother  Acute pharyngitis, unspecified etiology     Discharge Instructions      The strep test is negative.  Follow-up with your primary care provider if your symptoms are not improving.    ED Prescriptions   None    PDMP not reviewed this encounter.   Wellington Half, NP 10/11/23 1242

## 2023-11-30 ENCOUNTER — Telehealth: Payer: Self-pay | Admitting: Physician Assistant

## 2023-11-30 DIAGNOSIS — B9689 Other specified bacterial agents as the cause of diseases classified elsewhere: Secondary | ICD-10-CM

## 2023-11-30 DIAGNOSIS — J019 Acute sinusitis, unspecified: Secondary | ICD-10-CM

## 2023-11-30 MED ORDER — AMOXICILLIN 875 MG PO TABS: 875.0000 mg | ORAL_TABLET | Freq: Two times a day (BID) | ORAL | 0 refills | Status: AC

## 2023-11-30 NOTE — Progress Notes (Signed)
 E-Visit for Sinus Problems  We are sorry that you are not feeling well.  Here is how we plan to help!  Based on what you have shared with me it looks like you have sinusitis.  Sinusitis is inflammation and infection in the sinus cavities of the head.  Based on your presentation I believe you most likely have Acute Bacterial Sinusitis.  This is an infection caused by bacteria and is treated with antibiotics. I have prescribed Amoxicillin  875 mg twice daily for 7 days. You may use plain Mucinex. Saline nasal spray help and can safely be used as often as needed for congestion.  If you develop worsening sinus pain, fever or notice severe headache and vision changes, or if symptoms are not better after completion of antibiotic, please schedule an appointment with a health care provider.    Sinus infections are not as easily transmitted as other respiratory infection, however we still recommend that you avoid close contact with loved ones, especially the very young and elderly.  Remember to wash your hands thoroughly throughout the day as this is the number one way to prevent the spread of infection!  Home Care: Only take medications as instructed by your medical team. Complete the entire course of an antibiotic. Do not take these medications with alcohol. A steam or ultrasonic humidifier can help congestion.  You can place a towel over your head and breathe in the steam from hot water coming from a faucet. Avoid close contacts especially the very young and the elderly. Cover your mouth when you cough or sneeze. Always remember to wash your hands.  Get Help Right Away If: You develop worsening fever or sinus pain. You develop a severe head ache or visual changes. Your symptoms persist after you have completed your treatment plan.  Make sure you Understand these instructions. Will watch your condition. Will get help right away if you are not doing well or get worse.  Thank you for choosing an  e-visit.  Your e-visit answers were reviewed by a board certified advanced clinical practitioner to complete your personal care plan. Depending upon the condition, your plan could have included both over the counter or prescription medications.  Please review your pharmacy choice. Make sure the pharmacy is open so you can pick up prescription now. If there is a problem, you may contact your provider through [employer] and have the prescription routed to another pharmacy.  Your safety is important to us . If you have drug allergies check your prescription carefully.   For the next 24 hours you can use MyChart to ask questions about today's visit, request a non-urgent call back, or ask for a work or school excuse. You will get an email in the next two days asking about your experience. I hope that your e-visit has been valuable and will speed your recovery.

## 2023-11-30 NOTE — Progress Notes (Signed)
 I have spent 5 minutes in review of e-visit questionnaire, review and updating patient chart, medical decision making and response to patient.   Piedad Climes, PA-C
